# Patient Record
Sex: Female | Born: 1990 | Race: Black or African American | Hispanic: No | Marital: Single | Smoking: Light tobacco smoker
Health system: Southern US, Community
[De-identification: ages and names within clinical notes are randomized; demographics above are authoritative.]

## PROBLEM LIST (undated history)

## (undated) DIAGNOSIS — A6000 Herpesviral infection of urogenital system, unspecified: Secondary | ICD-10-CM

## (undated) DIAGNOSIS — E1129 Type 2 diabetes mellitus with other diabetic kidney complication: Secondary | ICD-10-CM

## (undated) HISTORY — PX: FOOT SURGERY: SHX648

---
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
## 2011-08-29 ENCOUNTER — Ambulatory Visit: Payer: Self-pay | Admitting: Internal Medicine

## 2011-09-18 ENCOUNTER — Ambulatory Visit: Payer: Self-pay | Admitting: Internal Medicine

## 2011-10-23 ENCOUNTER — Ambulatory Visit: Payer: Self-pay | Admitting: Internal Medicine

## 2011-11-18 ENCOUNTER — Ambulatory Visit: Payer: Self-pay | Admitting: Internal Medicine

## 2012-05-05 ENCOUNTER — Emergency Department: Payer: Self-pay | Admitting: Emergency Medicine

## 2012-05-05 LAB — PREGNANCY, URINE: Pregnancy Test, Urine: NEGATIVE m[IU]/mL

## 2012-06-08 ENCOUNTER — Ambulatory Visit: Payer: Self-pay | Admitting: Sports Medicine

## 2012-06-10 ENCOUNTER — Ambulatory Visit: Payer: Self-pay | Admitting: Sports Medicine

## 2012-06-27 ENCOUNTER — Ambulatory Visit: Payer: Self-pay | Admitting: Anesthesiology

## 2012-06-27 LAB — CBC WITH DIFFERENTIAL/PLATELET
Basophil #: 0.1 10*3/uL (ref 0.0–0.1)
Eosinophil #: 0.1 10*3/uL (ref 0.0–0.7)
HCT: 39.1 % (ref 35.0–47.0)
Lymphocyte #: 1.5 10*3/uL (ref 1.0–3.6)
MCH: 27.9 pg (ref 26.0–34.0)
MCHC: 33.1 g/dL (ref 32.0–36.0)
MCV: 84 fL (ref 80–100)
Monocyte #: 0.5 x10 3/mm (ref 0.2–0.9)
Neutrophil #: 6.5 10*3/uL (ref 1.4–6.5)
Platelet: 307 10*3/uL (ref 150–440)
RDW: 13.3 % (ref 11.5–14.5)

## 2012-06-27 LAB — BASIC METABOLIC PANEL
Calcium, Total: 8.7 mg/dL (ref 8.5–10.1)
Chloride: 101 mmol/L (ref 98–107)
Co2: 27 mmol/L (ref 21–32)
EGFR (African American): >60
EGFR (Non-African Amer.): >60
Glucose: 334 mg/dL — ABNORMAL HIGH (ref 65–99)
Osmolality: 283 (ref 275–301)
Sodium: 135 mmol/L — ABNORMAL LOW (ref 136–145)

## 2012-07-08 ENCOUNTER — Ambulatory Visit: Payer: Self-pay | Admitting: Orthopedic Surgery

## 2012-07-08 LAB — PREGNANCY, URINE: Pregnancy Test, Urine: NEGATIVE m[IU]/mL

## 2012-07-11 ENCOUNTER — Ambulatory Visit: Payer: Self-pay | Admitting: Orthopedic Surgery

## 2013-07-24 ENCOUNTER — Observation Stay: Payer: Self-pay | Admitting: Internal Medicine

## 2013-07-24 LAB — CBC
HCT: 43.4 % (ref 35.0–47.0)
HGB: 15.4 g/dL (ref 12.0–16.0)
MCHC: 35.4 g/dL (ref 32.0–36.0)
Platelet: 291 10*3/uL (ref 150–440)

## 2013-07-24 LAB — TROPONIN I: Troponin-I: <0.02 ng/mL

## 2013-07-24 LAB — COMPREHENSIVE METABOLIC PANEL
Albumin: 3.7 g/dL (ref 3.4–5.0)
Alkaline Phosphatase: 142 U/L — ABNORMAL HIGH (ref 50–136)
BUN: 8 mg/dL (ref 7–18)
Bilirubin,Total: 0.5 mg/dL (ref 0.2–1.0)
Calcium, Total: 9.3 mg/dL (ref 8.5–10.1)
Chloride: 104 mmol/L (ref 98–107)
Creatinine: 0.68 mg/dL (ref 0.60–1.30)
EGFR (Non-African Amer.): >60
Osmolality: 279 (ref 275–301)
Potassium: 3.9 mmol/L (ref 3.5–5.1)
SGOT(AST): 14 U/L — ABNORMAL LOW (ref 15–37)
Total Protein: 7.9 g/dL (ref 6.4–8.2)

## 2013-07-24 LAB — URINALYSIS, COMPLETE
Blood: NEGATIVE
Glucose,UR: >=500 mg/dL (ref 0–75)
Nitrite: NEGATIVE
Protein: NEGATIVE
RBC,UR: 7 /HPF (ref 0–5)
WBC UR: 11 /HPF (ref 0–5)

## 2013-07-24 LAB — APTT: Activated PTT: 25.6 secs (ref 23.6–35.9)

## 2013-07-24 LAB — PRO B NATRIURETIC PEPTIDE: B-Type Natriuretic Peptide: 139 pg/mL — ABNORMAL HIGH (ref 0–125)

## 2013-07-24 LAB — PROTIME-INR: Prothrombin Time: 13.1 secs (ref 11.5–14.7)

## 2013-07-25 LAB — LIPID PANEL
Cholesterol: 181 mg/dL (ref 0–200)
HDL Cholesterol: 30 mg/dL — ABNORMAL LOW (ref 40–60)
Ldl Cholesterol, Calc: 106 mg/dL — ABNORMAL HIGH (ref 0–100)
VLDL Cholesterol, Calc: 45 mg/dL — ABNORMAL HIGH (ref 5–40)

## 2013-07-25 LAB — BASIC METABOLIC PANEL
Anion Gap: 7 (ref 7–16)
BUN: 9 mg/dL (ref 7–18)
Calcium, Total: 8.8 mg/dL (ref 8.5–10.1)
Co2: 25 mmol/L (ref 21–32)
Creatinine: 0.73 mg/dL (ref 0.60–1.30)
EGFR (African American): >60
EGFR (Non-African Amer.): >60
Glucose: 401 mg/dL — ABNORMAL HIGH (ref 65–99)
Osmolality: 282 (ref 275–301)
Sodium: 133 mmol/L — ABNORMAL LOW (ref 136–145)

## 2013-07-25 LAB — CBC WITH DIFFERENTIAL/PLATELET
Basophil #: 0.1 10*3/uL (ref 0.0–0.1)
Basophil %: 0.8 %
HCT: 42.1 % (ref 35.0–47.0)
HGB: 14.4 g/dL (ref 12.0–16.0)
Lymphocyte #: 2.3 10*3/uL (ref 1.0–3.6)
Lymphocyte %: 26 %
MCHC: 34.3 g/dL (ref 32.0–36.0)
MCV: 84 fL (ref 80–100)
Monocyte #: 0.5 x10 3/mm (ref 0.2–0.9)
Neutrophil #: 6 10*3/uL (ref 1.4–6.5)
Neutrophil %: 66.3 %
RBC: 5.03 10*6/uL (ref 3.80–5.20)

## 2013-07-25 LAB — HEMOGLOBIN A1C: Hemoglobin A1C: 13.3 % — ABNORMAL HIGH (ref 4.2–6.3)

## 2013-07-25 LAB — MAGNESIUM: Magnesium: 1.6 mg/dL — ABNORMAL LOW

## 2013-07-25 LAB — TROPONIN I: Troponin-I: <0.02 ng/mL

## 2013-07-26 LAB — URINE CULTURE

## 2013-07-26 LAB — SODIUM: Sodium: 134 mmol/L — ABNORMAL LOW (ref 136–145)

## 2013-12-15 ENCOUNTER — Emergency Department: Payer: Self-pay | Admitting: Emergency Medicine

## 2013-12-15 LAB — BASIC METABOLIC PANEL
Anion Gap: 7 (ref 7–16)
Chloride: 102 mmol/L (ref 98–107)
Creatinine: 0.65 mg/dL (ref 0.60–1.30)
EGFR (African American): >60
EGFR (Non-African Amer.): >60
Glucose: 416 mg/dL — ABNORMAL HIGH (ref 65–99)
Osmolality: 281 (ref 275–301)
Potassium: 4.2 mmol/L (ref 3.5–5.1)

## 2013-12-15 LAB — CBC
HCT: 40.8 % (ref 35.0–47.0)
HGB: 13.9 g/dL (ref 12.0–16.0)
MCHC: 34.1 g/dL (ref 32.0–36.0)
RBC: 4.87 10*6/uL (ref 3.80–5.20)
RDW: 13.1 % (ref 11.5–14.5)
WBC: 9.1 10*3/uL (ref 3.6–11.0)

## 2014-03-25 ENCOUNTER — Emergency Department: Payer: Self-pay | Admitting: Emergency Medicine

## 2014-07-13 ENCOUNTER — Emergency Department: Payer: Self-pay | Admitting: Emergency Medicine

## 2014-07-13 LAB — URINALYSIS, COMPLETE
BILIRUBIN, UR: NEGATIVE
Nitrite: NEGATIVE
PROTEIN: NEGATIVE
Ph: 5 (ref 4.5–8.0)
RBC,UR: 103 /HPF (ref 0–5)
Specific Gravity: 1.042 (ref 1.003–1.030)
Squamous Epithelial: 27
WBC UR: 115 /HPF (ref 0–5)

## 2014-07-13 LAB — CBC WITH DIFFERENTIAL/PLATELET
Basophil #: 0.1 10*3/uL (ref 0.0–0.1)
Basophil %: 0.6 %
EOS ABS: 0.1 10*3/uL (ref 0.0–0.7)
EOS PCT: 1.1 %
HCT: 45.3 % (ref 35.0–47.0)
HGB: 14.9 g/dL (ref 12.0–16.0)
LYMPHS ABS: 1.5 10*3/uL (ref 1.0–3.6)
Lymphocyte %: 16.7 %
MCH: 28.1 pg (ref 26.0–34.0)
MCHC: 32.9 g/dL (ref 32.0–36.0)
MCV: 85 fL (ref 80–100)
Monocyte #: 0.5 x10 3/mm (ref 0.2–0.9)
Monocyte %: 5.4 %
NEUTROS ABS: 6.7 10*3/uL — AB (ref 1.4–6.5)
Neutrophil %: 76.2 %
PLATELETS: 302 10*3/uL (ref 150–440)
RBC: 5.31 10*6/uL — ABNORMAL HIGH (ref 3.80–5.20)
RDW: 12.9 % (ref 11.5–14.5)
WBC: 8.8 10*3/uL (ref 3.6–11.0)

## 2014-07-13 LAB — BASIC METABOLIC PANEL
Anion Gap: 9 (ref 7–16)
BUN: 8 mg/dL (ref 7–18)
CALCIUM: 8.8 mg/dL (ref 8.5–10.1)
Chloride: 102 mmol/L (ref 98–107)
Co2: 21 mmol/L (ref 21–32)
Creatinine: 0.67 mg/dL (ref 0.60–1.30)
EGFR (African American): >60
EGFR (Non-African Amer.): >60
GLUCOSE: 375 mg/dL — AB (ref 65–99)
OSMOLALITY: 278 (ref 275–301)
Potassium: 4 mmol/L (ref 3.5–5.1)
SODIUM: 132 mmol/L — AB (ref 136–145)

## 2014-07-13 LAB — TROPONIN I: Troponin-I: <0.02 ng/mL

## 2014-07-15 ENCOUNTER — Emergency Department: Payer: Self-pay | Admitting: Emergency Medicine

## 2014-07-15 LAB — URINALYSIS, COMPLETE
Bilirubin,UR: NEGATIVE
Glucose,UR: >=500 mg/dL (ref 0–75)
Nitrite: NEGATIVE
PH: 5 (ref 4.5–8.0)
PROTEIN: NEGATIVE
RBC,UR: 55 /HPF (ref 0–5)
Specific Gravity: 1.039 (ref 1.003–1.030)
Squamous Epithelial: 12

## 2014-07-15 LAB — COMPREHENSIVE METABOLIC PANEL
AST: 11 U/L — AB (ref 15–37)
Albumin: 3.5 g/dL (ref 3.4–5.0)
Alkaline Phosphatase: 141 U/L — ABNORMAL HIGH
Anion Gap: 9 (ref 7–16)
BUN: 7 mg/dL (ref 7–18)
Bilirubin,Total: 0.5 mg/dL (ref 0.2–1.0)
Calcium, Total: 8 mg/dL — ABNORMAL LOW (ref 8.5–10.1)
Chloride: 101 mmol/L (ref 98–107)
Co2: 22 mmol/L (ref 21–32)
Creatinine: 0.73 mg/dL (ref 0.60–1.30)
EGFR (African American): >60
EGFR (Non-African Amer.): >60
Glucose: 420 mg/dL — ABNORMAL HIGH (ref 65–99)
OSMOLALITY: 280 (ref 275–301)
POTASSIUM: 4 mmol/L (ref 3.5–5.1)
SGPT (ALT): 21 U/L
Sodium: 132 mmol/L — ABNORMAL LOW (ref 136–145)
TOTAL PROTEIN: 7.6 g/dL (ref 6.4–8.2)

## 2014-07-15 LAB — CBC
HCT: 42.8 % (ref 35.0–47.0)
HGB: 14.1 g/dL (ref 12.0–16.0)
MCH: 28.2 pg (ref 26.0–34.0)
MCHC: 32.8 g/dL (ref 32.0–36.0)
MCV: 86 fL (ref 80–100)
Platelet: 286 10*3/uL (ref 150–440)
RBC: 4.99 10*6/uL (ref 3.80–5.20)
RDW: 13.2 % (ref 11.5–14.5)
WBC: 6.8 10*3/uL (ref 3.6–11.0)

## 2014-07-15 LAB — LIPASE, BLOOD: Lipase: 89 U/L (ref 73–393)

## 2014-09-12 ENCOUNTER — Emergency Department: Payer: Self-pay | Admitting: Student

## 2014-09-14 ENCOUNTER — Emergency Department: Payer: Self-pay | Admitting: Emergency Medicine

## 2014-11-08 ENCOUNTER — Emergency Department: Payer: Self-pay | Admitting: Student

## 2014-11-13 ENCOUNTER — Observation Stay: Payer: Self-pay | Admitting: Surgery

## 2014-11-13 LAB — CBC WITH DIFFERENTIAL/PLATELET
BASOS PCT: 0.4 %
Basophil #: 0 10*3/uL (ref 0.0–0.1)
Eosinophil #: 0.1 10*3/uL (ref 0.0–0.7)
Eosinophil %: 0.8 %
HCT: 35.9 % (ref 35.0–47.0)
HGB: 11.9 g/dL — AB (ref 12.0–16.0)
LYMPHS PCT: 13.1 %
Lymphocyte #: 1.1 10*3/uL (ref 1.0–3.6)
MCH: 28.2 pg (ref 26.0–34.0)
MCHC: 33.2 g/dL (ref 32.0–36.0)
MCV: 85 fL (ref 80–100)
Monocyte #: 0.7 x10 3/mm (ref 0.2–0.9)
Monocyte %: 7.8 %
Neutrophil #: 6.7 10*3/uL — ABNORMAL HIGH (ref 1.4–6.5)
Neutrophil %: 77.9 %
PLATELETS: 319 10*3/uL (ref 150–440)
RBC: 4.23 10*6/uL (ref 3.80–5.20)
RDW: 12.5 % (ref 11.5–14.5)
WBC: 8.7 10*3/uL (ref 3.6–11.0)

## 2014-11-13 LAB — BASIC METABOLIC PANEL
Anion Gap: 5 — ABNORMAL LOW (ref 7–16)
BUN: 5 mg/dL — AB (ref 7–18)
Calcium, Total: 8.8 mg/dL (ref 8.5–10.1)
Chloride: 99 mmol/L (ref 98–107)
Co2: 28 mmol/L (ref 21–32)
Creatinine: 0.8 mg/dL (ref 0.60–1.30)
EGFR (African American): >60
EGFR (Non-African Amer.): >60
GLUCOSE: 495 mg/dL — AB (ref 65–99)
OSMOLALITY: 284 (ref 275–301)
POTASSIUM: 4 mmol/L (ref 3.5–5.1)
Sodium: 132 mmol/L — ABNORMAL LOW (ref 136–145)

## 2014-11-14 LAB — PREGNANCY, URINE: Pregnancy Test, Urine: NEGATIVE m[IU]/mL

## 2014-11-16 LAB — WOUND AEROBIC CULTURE

## 2014-11-18 LAB — CULTURE, BLOOD (SINGLE)

## 2015-01-13 ENCOUNTER — Emergency Department: Payer: Self-pay | Admitting: Emergency Medicine

## 2015-01-22 ENCOUNTER — Inpatient Hospital Stay: Payer: Self-pay | Admitting: Surgery

## 2015-01-22 LAB — BASIC METABOLIC PANEL
ANION GAP: 8 (ref 7–16)
BUN: 5 mg/dL — ABNORMAL LOW (ref 7–18)
CREATININE: 0.79 mg/dL (ref 0.60–1.30)
Calcium, Total: 9.1 mg/dL (ref 8.5–10.1)
Chloride: 99 mmol/L (ref 98–107)
Co2: 25 mmol/L (ref 21–32)
GLUCOSE: 403 mg/dL — AB (ref 65–99)
Osmolality: 279 (ref 275–301)
Potassium: 4 mmol/L (ref 3.5–5.1)
Sodium: 132 mmol/L — ABNORMAL LOW (ref 136–145)

## 2015-01-22 LAB — CBC WITH DIFFERENTIAL/PLATELET
BASOS ABS: 0.1 10*3/uL (ref 0.0–0.1)
Basophil %: 0.6 %
EOS PCT: 0.6 %
Eosinophil #: 0.1 10*3/uL (ref 0.0–0.7)
HCT: 39.5 % (ref 35.0–47.0)
HGB: 13.2 g/dL (ref 12.0–16.0)
LYMPHS ABS: 1.5 10*3/uL (ref 1.0–3.6)
LYMPHS PCT: 12.7 %
MCH: 27.8 pg (ref 26.0–34.0)
MCHC: 33.4 g/dL (ref 32.0–36.0)
MCV: 83 fL (ref 80–100)
MONOS PCT: 4.8 %
Monocyte #: 0.6 x10 3/mm (ref 0.2–0.9)
NEUTROS ABS: 9.5 10*3/uL — AB (ref 1.4–6.5)
Neutrophil %: 81.3 %
Platelet: 357 10*3/uL (ref 150–440)
RBC: 4.75 10*6/uL (ref 3.80–5.20)
RDW: 13.3 % (ref 11.5–14.5)
WBC: 11.7 10*3/uL — AB (ref 3.6–11.0)

## 2015-01-23 LAB — HEMOGLOBIN A1C: Hemoglobin A1C: 13.3 % — ABNORMAL HIGH (ref 4.2–6.3)

## 2015-01-24 LAB — LIPID PANEL
Cholesterol: 141 mg/dL (ref 0–200)
HDL Cholesterol: 30 mg/dL — ABNORMAL LOW (ref 40–60)
Ldl Cholesterol, Calc: 83 mg/dL (ref 0–100)
Triglycerides: 142 mg/dL (ref 0–200)
VLDL Cholesterol, Calc: 28 mg/dL (ref 5–40)

## 2015-01-24 LAB — SODIUM: SODIUM: 136 mmol/L (ref 136–145)

## 2015-01-24 LAB — TSH: Thyroid Stimulating Horm: 0.538 u[IU]/mL

## 2015-01-24 LAB — VANCOMYCIN, TROUGH: Vancomycin, Trough: 11 ug/mL (ref 10–20)

## 2015-01-24 LAB — WBC: WBC: 9.7 10*3/uL (ref 3.6–11.0)

## 2015-01-24 LAB — CULTURE, BLOOD (SINGLE)

## 2015-01-25 LAB — CREATININE, SERUM
Creatinine: 0.72 mg/dL (ref 0.60–1.30)
EGFR (Non-African Amer.): >60

## 2015-01-25 LAB — CULTURE, BLOOD (SINGLE)

## 2015-01-25 LAB — RAPID HIV SCREEN (HIV 1/2 AB+AG)

## 2015-04-06 NOTE — Op Note (Signed)
PATIENT NAME:  Katie Gilbert, [name] MR#:  [id number] DATE OF BIRTH:  1991/06/27  DATE OF PROCEDURE:  07/11/2012  PREOPERATIVE DIAGNOSIS: Bilateral hip hematomas.   POSTOPERATIVE DIAGNOSIS: Bilateral hip hematomas.   PROCEDURE: Irrigation and debridement of bilateral hip hematomas.   ANESTHESIA: General.   SURGEON: Leitha SchullerMichael J. Shericka Johnstone, MD    DESCRIPTION OF PROCEDURE: The patient was placed in a supine position with a bump underneath the left hip. The left hip was prepped and draped in the usual sterile fashion. After patient identification and time-out procedures were completed, anterior lateral incision was made over the proximal thigh approximately 6 cm in length with meticulous hemostasis achieved in the subcutaneous tissue. The large hematoma was entered and fluid evacuated along with some necrotic fatty tissue. The wound was then irrigated. A rongeur was used to remove the smooth wall, this was essentially a pseudocyst, with a rasp also used to roughen the tissue. After rongeuring away the area that had been opened, additional debridement over the fascia was carried out to provide a subcutaneous fat layer of scar back to the cyst. The hematoma area was approximately 14 cm long, 7 cm wide, and 4 cm deep. Foam was then placed into the wound and Wound VAC applied. The bump was then taken out from under the left hip and the right hip prepped and draped in the usual sterile fashion as previously described. The anterior lateral incision was made directly over the mass and the mass entered. There was a large amount of hematoma present. The total estimated blood loss was approximately 350 mL, most of this being from the hematomas. Rongeur was then used again to debride so the pseudocyst wall and the deep fascia to get fresh tissue to get scarring. There was also some necrotic subcutaneous fat which was debrided as well. The right hip hematoma measured 15 cm long, 12 cm wide, and 4 cm deep. Wound VAC was applied to  this wound as well and after both suctions were applied the was no prominence from the hematoma which was present at the start of the case. The patient was sent to the recovery room in stable condition.   ESTIMATED BLOOD LOSS: 350.   COMPLICATIONS: None.   SPECIMENS: None.   Wound VAC was applied to both hips.    ____________________________ Leitha SchullerMichael J. Birt Reinoso, MD mjm:drc D: 07/11/2012 21:23:11 ET T: 07/12/2012 10:22:59 ET JOB#: [id number]  cc: Leitha SchullerMichael J. Romi Rathel, MD, <Dictator> Leitha SchullerMICHAEL J Shallen Luedke MD ELECTRONICALLY SIGNED 07/12/2012 17:03

## 2015-04-09 NOTE — H&P (Signed)
PATIENT NAME:  Katie Gilbert, Katie Gilbert MR#:  [id number] DATE OF BIRTH:  Mar 08, 1991  DATE OF ADMISSION:  07/24/2013  PRIMARY CARE PHYSICIAN: None.   REFERRING EMERGENCY ROOM PHYSICIAN: Cecille AmsterdamJonathan E. Mayford KnifeWilliams, MD  CHIEF COMPLAINT: Dizziness.   HISTORY OF PRESENT ILLNESS: The patient is a 24 year old female with past history of diabetes since age of 14 years. She was in her usual state of health until yesterday. She woke up today at 1:00 p.m. She works in [employer], so she went to her work in the afternoon and she was feeling dizzy. Since she woke up this afternoon, she is feeling dizzy and she is feeling any palpitations and lightheaded. At work, she continued feeling like this,  so she just sat down on a bench and after a few minutes tried to get up but every time she tried to get up, she was feeling dizzy and so finally decided to come to the Emergency Room. On arrival to ER, she was found having atrial fibrillation and her heart rate was at  one time ranging like 115, 120. Blood pressure was high when she came in the Emergency Room, but then her blood pressure remained under control and her heart rate came down by 12. She is still running atrial fibrillation, but at a rate of 90 to 100. She received 1 dose of Lovenox 150 mg subcutaneous stat by ER and given for admission for further management, monitoring and to find out the reason for the atrial fibrillation to hospitalist team.   REVIEW OF SYSTEMS:    CONSTITUTIONAL: Negative for fever, fatigue, weakness, pain or weight loss.  EYES: No blurring, double vision, pain or redness.  EARS, NOSE, THROAT: No tinnitus, ear pain, hearing loss or discharge.  RESPIRATORY: No cough, wheezing, hemoptysis or shortness of breath.  CARDIOVASCULAR: No chest pain, but she had arrhythmia, palpitations and feeling dizzy. Denies any syncopal episodes or edema on the legs.  GASTROINTESTINAL: No nausea, vomiting, diarrhea or abdominal pain.  GENITOURINARY: No dysuria,  hematuria or increased frequency in the urination.  ENDOCRINE: No nocturia, heat or cold intolerance or weight loss.  MUSCULOSKELETAL: No pain or swelling in the joints.  SKIN: No acne or rashes.  NEUROLOGICAL: No numbness, weakness, tremors or vertigo.   PAST MEDICAL HISTORY: Diabetes since age of 14 years.   ALLERGIES TO MEDICATION: None.   HOME MEDICATIONS: Metformin and Lantus as per sliding scale.   SOCIAL HISTORY: She used to smoke infrequently and sometimes marijuana, but she stopped since last 1 month. Before that, she was once a week smoker. She denies any other drug use. For alcohol, she is using once or twice a month (that would be socially). She denies any excessive coffee or chocolate use. Last alcohol consumption was last week. Denies any excessive stress or sleeplessness due to work. Works at [employer].   FAMILY HISTORY: Father died of MI at age of 24 years.   PHYSICAL EXAMINATION: VITAL SIGNS: On presentation in the ER, heart rate was 84. Blood pressure was 176/120 and temperature was 98.9. Heart went up to 114 one time and as per EKG which was done in the ER, heart rate is 125. Oxygen saturation 99 on room air.  GENERAL APPEARANCE: Obese, well-developed, not in acute distress.  HEENT: Head and neck atraumatic. Conjunctivae pink. Oral mucosa moist.  NECK: Supple. No JVD.  RESPIRATORY: Bilateral clear and equal air entry.  CARDIOVASCULAR: S1, S2 present, irregular, tachycardia. No murmur appreciated.  ABDOMEN: Soft, nontender. Bowel sounds present.  No organomegaly.  SKIN: No rashes.  MUSCULOSKELETAL: Legs: No edema. Joints: No tenderness or swelling.  NEUROLOGICAL: Power 5/5. Follows commands. No tremors.   LABORATORY DATA: Glucose 331. BNP 139. BUN 8, creatinine 0.68, sodium 134, potassium 3.9, chloride 104, CO2 of 22, total protein 7.9, albumin 3.7, bilirubin 0.5, SGOT 14, SGPT 20. TSH 1.03. Troponin less than 0.02. WBC 9.5, hemoglobin 15.4, platelet count 291.  D-dimer less than 0.22. Urinalysis is positive with 11 WBCs  and 3+ leukocyte esterase with trace bacteria, but the patient does not have any complaints.   ASSESSMENT AND PLAN: A 24 year old female with past medical history of diabetes, morbidly obese, presented with feeling dizziness and found having new-onset atrial fibrillation. Urinalysis is positive for infection.  1.  New-onset atrial fibrillation: Possibly this might be due to her urinary tract infection, which urinalysis is positive, recently reported now. Will treat her urinary tract infection but for atrial fibrillation, will try to find out the cause. Will check magnesium level. Follow her on telemetry and troponin levels. Will do echocardiogram and do cardiology consult for further management.  2.  Urinary tract infection: Will trend urine culture and will start him on intravenous Rocephin for now.  3.  Diabetes: Will continue metformin as she is taking and put her on sliding scale insulin coverage.  TOTAL TIME SPENT: 45 minutes.   CODE STATUS: Full code.    ____________________________ Hope Pigeon Elisabeth Pigeon, MD vgv:jm D: 07/24/2013 20:42:02 ET T: 07/24/2013 20:56:48 ET JOB#: [id number]  cc: Hope Pigeon. Elisabeth Pigeon, MD, <Dictator> Altamese Dilling MD ELECTRONICALLY SIGNED 08/05/2013 11:15

## 2015-04-09 NOTE — Consult Note (Signed)
PATIENT NAME:  Katie Gilbert, Katie Gilbert MR#:  [id number] DATE OF BIRTH:  August 28, 1991  DATE OF CONSULTATION:  07/25/2013  REFERRING PHYSICIAN:  Dr. Allena KatzPatel. CONSULTING PHYSICIAN:  Lamar BlinksBruce J. Jovan Schickling, MD  REASON FOR CONSULTATION: Diabetes with atrial fibrillation with rapid ventricular rate.   CHIEF COMPLAINT: "Gilbert am weak."   HISTORY OF PRESENT ILLNESS: This is a 24 year old female with known diabetes on appropriate medication management and stable. The patient has not had any hypertension requiring further intervention. Recently, she has had 3 to 4 days of weakness and fatigue, and then was going to work with some irregular heartbeat, weakness and fatigue and dizziness. When taken to the Emergency Room, she had reasonable blood pressure control, but had an EKG showing atrial fibrillation with rapid ventricular rate. The patient has had no evidence of chest discomfort or congestive heart failure. Currently, the patient has had additional medication management with better heart rate control. Currently, she is feeling better.   REVIEW OF SYSTEMS: Remainder of review of systems negative for syncope, dizziness, nausea, diaphoresis, weakness, fatigue, vision change, ringing in the ears, hearing loss, cough, congestion, heartburn, nausea, vomiting, diarrhea, bloody stools, stomach pain, extremity pain, leg weakness, cramping of the buttocks, known blood clots, headaches, blackouts, skin lesions or skin rashes.   PAST MEDICAL HISTORY:  1. Diabetes.   FAMILY HISTORY: Father died of myocardial infarction at age 24.   SOCIAL HISTORY: She denies tobacco use. Occasionally drinks alcohol.   ALLERGIES AND MEDICATIONS: As listed.   PHYSICAL EXAMINATION:  VITAL SIGNS: Blood pressure is 122/68 bilaterally, heart rate 78 upright, reclining, and irregular.  GENERAL: She is a well-appearing female in no acute distress.  HEAD, EYES, EARS, NOSE AND THROAT: No icterus, thyromegaly, ulcers, hemorrhage or xanthelasma.   CARDIOVASCULAR: Irregularly irregular with normal S1 and S2, without any murmur, gallop or rub. PMI is diffuse. Carotid upstroke normal without bruit. Jugular venous pressure is normal.  LUNGS: Have a few basilar crackles, with normal respirations.  ABDOMEN: Soft, nontender, without hepatosplenomegaly or masses. Abdominal aorta is normal size without bruit.  EXTREMITIES: Show 2+ radial, femoral, dorsal pedal pulses with no lower extremity edema, cyanosis, clubbing or ulcers.  NEUROLOGIC: She is oriented to time, place and person, with normal mood and affect.   ASSESSMENT: A 24 year old female with diabetes and new-onset atrial fibrillation with rapid ventricular rate.   RECOMMENDATIONS:  1. Further investigation of other causes, including thyroid and/or electrolyte abnormalities for cause of atrial fibrillation.  2. Calcium channel blocker for heart rate control with goal heart rate between 60 and 90 beats per minute, with possible spontaneous conversion to normal rhythm.  3. Anticoagulation while actively treating atrial fibrillation for further risk reduction in stroke with Xarelto 20 mg each day.  4. Echocardiogram for LV systolic dysfunction and valvular heart disease causing above.  5. Further adjustments of medication management, including Cardizem, with ambulation and discharged home thereafter.   ____________________________ Lamar BlinksBruce J. Cai Flott, MD bjk:OSi D: 07/25/2013 08:03:12 ET T: 07/25/2013 08:25:53 ET JOB#: [id number]  cc: Lamar BlinksBruce J. Alyric Parkin, MD, <Dictator> Lamar BlinksBRUCE J Addeline Calarco MD ELECTRONICALLY SIGNED 07/25/2013 14:08

## 2015-04-09 NOTE — Discharge Summary (Signed)
PATIENT NAME:  Katie Gilbert, Katie Gilbert MR#:  [id number] DATE OF BIRTH:  [birth year]  DATE OF ADMISSION:  07/24/2013 DATE OF DISCHARGE:  07/26/2013  PRESENTING COMPLAINT: Palpitations.  DISCHARGE DIAGNOSIS:  1.  Atrial fibrillation with rapid ventricular response, new onset.  2.  Poorly controlled type 2 diabetes.  3.  Morbid obesity.  4.  Suspected sleep apnea.   CONDITION ON DISCHARGE: Fair.   DISCHARGE MEDICATIONS: 1.  Metformin 1000 mg b.Gilbert.d.  2.  Lantus 30 units daily.  3.  Aspirin 325 mg p.o. daily.  4.  Diltiazem 120 mg extended release daily.   DISCHARGE INSTRUCTIONS: 1.  Follow up with Dr. Gwen PoundsKowalski in 1 to 2 weeks.  2.  Follow up with Dr. Ellie LunchBhakti Paul, endocrinologist, at [facility].  3.  Re-establish primary care with Dr. Yates DecampJohn Walker.  CONSULTANTS:  Cardiology with Dr. Gwen PoundsKowalski.   DISCHARGE LABORATORY AND DIAGNOSTICS:  Sodium is 134. Troponin is less than  0.02. TSH is 1.42. Hemoglobin A1c is 13. Magnesium is 1.6.  H and H are 14.4 and 42.1.  White count is 9.0. Cholesterol is 181. HDL is 30. LDL is 106.   Chest x-ray:  No acute cardiopulmonary disease.   UA negative for UTI. Negative for proteinuria.   PT-INR within normal limits. B-type natriuretic peptide is 139.  BRIEF SUMMARY OF HOSPITAL COURSE:  Katie Gilbert is a 24 year old obese 325 pound African American female with history of type 2 diabetes since age 24 comes to the Emergency Room after she started noticing having some palpitations and dizziness at work. She was admitted with:  1.  New onset A-fib with RVR. The patient was started on p.o. Cardizem.  She was converted to sinus rhythm prior to discharge. Cardiology consultation was obtained with Dr. Gwen PoundsKowalski. She was initially started on Lovenox subcu b.Gilbert.d. which was changed to p.o. aspirin 325 per cardiology recommendations. Underlying etiology for A-fib could be untreated sleep apnea. She will get further work-up as outpatient by Dr. Gwen PoundsKowalski.  2.  UTI. The  patient was given Cipro for 3 days.  3.  Type 2 diabetes, poorly controlled. The patient's metformin was continued. She was put on her home dose Lantus. She was also asked to follow up with Dr. Renae FicklePaul, endocrinology, as an outpatient.  4.  Hyperlipidemia. Dietary restrictions explained. Will need tight control and at a later date can be started on statins if fails diet control.   Hospital stay otherwise remained stable. The patient remained a FULL CODE.   TIME SPENT: 40 minutes. ____________________________ Wylie HailSona A. Allena KatzPatel, MD sap:sb D: 07/28/2013 06:34:48 ET T: 07/28/2013 11:42:13 ET JOB#: [id number]  cc: Staley Lunz A. Allena KatzPatel, MD, <Dictator> Lamar BlinksBruce J. Kowalski, MD Bhakti B. Renae FicklePaul, MD Letta PateJohn B. Danne HarborWalker III, MD Willow OraSONA A Victory Dresden MD ELECTRONICALLY SIGNED 08/08/2013 5:40
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
## 2015-04-10 NOTE — Op Note (Signed)
PATIENT NAME:  Katie Gilbert, Katie Gilbert MR#:  [id number] DATE OF BIRTH:  1990-12-27  DATE OF PROCEDURE:  11/14/2014  PREOPERATIVE DIAGNOSIS: Large persistent inadequately drained back abscess.   POSTOPERATIVE DIAGNOSIS: Large persistent inadequately drained back abscess.   PROCEDURE PERFORMED:  Incision and drainage of large back abscess.   ANESTHESIA:  IV sedation and local anesthetic.   SPECIMEN: None.   ESTIMATED BLOOD LOSS: 10 mL.   COMPLICATIONS: None.   INDICATION FOR SURGERY: Katie Gilbert is a pleasant 24 year old who presented with a large back abscess that was inadequately drained in the ED. There was still a copious amount of purulence. She was brought to the Operating Room for incision and drainage of a large back abscess.   DETAILS OF PROCEDURE: Informed consent was obtained. Katie Gilbert was laid in the right lateral decubitus position and given IV sedation. Her back was then prepped and draped in standard surgical fashion. Timeout then was performed correctly identifying the patient name, operative site and procedure to be performed. The wound was probed with a hemostat. Loculations were broken up. A counterincision was made at the inferior and superior aspects. Two Penroses which were 1/4 inch were then sutured to the skin edges at the incisions. The wound was then irrigated and packed with iodine-soaked gauze. A sterile dressing was then placed over the wound. The patient was then awoken, extubated, and brought to the postanesthesia care unit. There were no immediate complications. Needle, sponge, and instrument counts correct at the end of the procedure.    ____________________________ Si Raiderhristopher A. Abir Eroh, MD cal:at D: 11/14/2014 11:00:04 ET T: 11/14/2014 12:06:59 ET JOB#: [id number]  cc: Cristal Deerhristopher A. Stephens Shreve, MD, <Dictator> Jarvis NewcomerHRISTOPHER A Bonnie Overdorf MD ELECTRONICALLY SIGNED 11/23/2014 20:09

## 2015-04-18 NOTE — Consult Note (Signed)
PATIENT NAME:  Katie Gilbert, Katie Gilbert MR#:  [id number] DATE OF BIRTH:  1991/03/05  DATE OF CONSULTATION:  01/25/2015  REFERRING PHYSICIAN:   CONSULTING PHYSICIAN:  Stann Mainlandavid P. Sampson GoonFitzgerald, MD  REQUESTING PHYSICIAN:  Dr. Sherryll BurgerShah.   REASON FOR CONSULTATION: Staph aureus bacteremia and abscess.   HISTORY OF PRESENT ILLNESS: This is a very pleasant 24 year old female with history of very poorly controlled diabetes, A1c 13.3, who was admitted on February 5 with an abscess on her back, as well one on her breast. She been followed as an outpatient, treated with clindamycin and Bactrim, and also had an outpatient Gilbert and D. The patient was admitted and found to have severe back infection and breast infection. She underwent Gilbert and D in the OR February 6. Cultures from her blood are now growing methicillin sensitive Staph aureus as well as coagulase-negative Staph. Prior cultures of her back in November when she had a similar infection grew methicillin sensitive Staph aureus. Followup blood cultures done February 6 are negative. We are consulted for further antibiotic management.   Clinically the patient reports feeling much better. She still has some pain at the breast and back sites that are dressed postoperatively. There continues to be some drainage.   PAST MEDICAL HISTORY:  1. Diabetes, very poorly controlled.  2. Hypertension.  3. Obesity.  4. Recurrent skin abscesses.   SOCIAL HISTORY: No tobacco, alcohol, or drugs. She lives with her mother. She works at a call center. She does not have any children.   FAMILY HISTORY: Noncontributory.   PAST SURGICAL HISTORY: She underwent an Gilbert and D drainage of her back in November.  ALLERGIES: No known drug allergies.   MEDICATIONS AS AN OUTPATIENT: Metformin.   CURRENT ANTIBIOTICS: Include vancomycin.   REVIEW OF SYSTEMS: 11 systems reviewed and negative except as per HPI.    PHYSICAL EXAMINATION:  VITAL SIGNS: Temperature 98.6, pulse 79, blood pressure 113/77,  respirations 18, saturation 99%.  GENERAL: She is obese, pleasant, interactive, lying in bed, in no acute distress.  HEENT: Pupils equal, round, reactive to light and accommodation. Extraocular movements are intact. Sclerae are anicteric. Oropharynx is clear with no thrush or lesions.  NECK: Supple.  HEART: Regular.  LUNGS: Clear to auscultation bilaterally.  ABDOMEN: Soft, nontender, nondistended. No hepatosplenomegaly.  EXTREMITIES: No clubbing, cyanosis, or edema.  SKIN: On her back she has a large golf ball size area of induration. There is an incision in the midline. It is packed and does have some drainage. On her left breast she also has a similar abscess cavity.  NEUROLOGIC: She is alert and oriented x 3. Grossly nonfocal neurologic exam.   LABORATORY DATA: Blood cultures February 5 with MSSA as well as coag-negative Staph in 1 of 2 cultures. Followup blood cultures February 6 are negative x 2. Culture in November also had MSSA from a back abscess. White blood count on admission February 5 was 11.7, currently is 9.7, hemoglobin 13.2, platelets 357,000. A1c is 13.3. Renal function is normal. Creatinine 0.72.   IMAGING: Chest x-ray done after PICC line placement shows PICC in appropriate position, no acute cardiopulmonary disease.   IMPRESSION: A 24 year old with very poorly controlled diabetes, A1c of 13 on just metformin, admitted with recurrent skin abscesses with methicillin-sensitive Staphylococcus aureus. She underwent incision and drainage on February 6 and the wounds were packed. She has 1 of 2 blood cultures with methicillin-sensitive Staphylococcus aureus as well as coag-negative Staph. Clinically she is improving. She has a PICC line in place.  Followup blood cultures are negative.  Echocardiogram transthoracic is negative.   RECOMMENDATIONS:  1.  Given that she has a skin source, 1 of 2 blood cultures positive, negative transthoracic echocardiogram, and rapid improvement Gilbert would  suggest a 2 week course of IV antibiotics. This can be consolidated to Ancef 1 gram q. 8 hours. She can receive this through the PICC line with home antibiotics from a home health agency. Gilbert have placed the antibiotic order sheet in her chart. She will need weekly CBC and comprehensive panel.  2.  Check an HIV test.  3.  Gilbert can see in 2-3 weeks time to follow up on the abscess as well as to insure clearance of her bacteremia.  4.  She will need much better diabetic control. Note she is at risk of further infection with her very poorly controlled diabetes. Suggest she get established with an endocrinologist as an outpatient.  5.  Thank you for the consult. Gilbert will be glad to follow with you.      ____________________________ Stann Mainland. Sampson Goon, MD dpf:bu D: 01/25/2015 15:29:28 ET T: 01/25/2015 16:01:45 ET JOB#: [id number]  cc: Stann Mainland. Sampson Goon, MD, <Dictator> Jaycelyn Orrison Sampson Goon MD ELECTRONICALLY SIGNED 01/27/2015 20:53

## 2015-04-18 NOTE — Op Note (Signed)
PATIENT NAME:  Katie Gilbert, [name] MR#:  [id number] DATE OF BIRTH:  Aug 09, 1991  DATE OF PROCEDURE:  01/23/2015  OPERATIONS PERFORMED:  1. Incision and drainage of left inner breast abscess.  2. Incision and drainage of back abscess.   SURGEON: Claude MangesWilliam F Wisam Siefring, MD   ANESTHESIA: General.   PREOPERATIVE DIAGNOSIS: Left inner breast abscess, back abscess.   PROCEDURE IN DETAIL: The patient was placed in the right lateral decubitus position and prepped and draped in the usual sterile fashion. Both abscesses were drained identically with incisions made transversely in the lines of the skin and carried down into the abscess cavity, where all of the pus was suctioned clear. Hemostasis was achieved with electrocautery, and each abscess cavity was separately packed with sterile 2-inch Kling gauze, and sterile 4 x 4's were placed on top of this with Elastoplast-type dressings. The patient tolerated the procedure well. There were no complications.    ____________________________ Claude MangesWilliam F. Baptiste Littler, MD wfm:mw D: 01/23/2015 09:44:48 ET T: 01/23/2015 12:09:41 ET JOB#: [id number]  cc: Claude MangesWilliam F. Jamaira Sherk, MD, <Dictator> Claude MangesWILLIAM F Telford Archambeau MD ELECTRONICALLY SIGNED 01/29/2015 19:25
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
## 2015-04-18 NOTE — Discharge Summary (Signed)
PATIENT NAME:  Katie Gilbert, Aslin I MR#:  [id number] DATE OF BIRTH:  1991-10-26  DATE OF ADMISSION:  01/22/2015 DATE OF DISCHARGE:  01/26/2015  PRINCIPAL DIAGNOSIS: Back abscess and medial left breast abscess.   OTHER DIAGNOSES:  1.  Obesity.  2.  Type 2 diabetes mellitus.  PRINCIPAL PROCEDURE PERFORMED DURING THIS ADMISSION: Incision and drainage of left inner breast abscess, incision and drainage of back abscess 01/23/2015.   HOSPITAL COURSE: The patient was admitted to the hospital and medicine consultation was obtained, and patient underwent the above-mentioned procedure for the above-mentioned diagnosis after her blood sugars came down under 400. She was found to have methicillin-sensitive Staphylococcus aureus and positive blood cultures and was maintained on IV antibiotics until discharge. She had a PICC line placed and was placed on IV Ancef, and her dressing was changed and repacked, and she was discharged home and asked to make an appointment to follow up with us in a week.   ____________________________ Claude MangesWilliam F. Michelena Culmer, MD wfm:ST D: 01/29/2015 19:21:39 ET T: 01/30/2015 02:07:55 ET JOB#: [id number]  cc: Claude MangesWilliam F. Shareta Fishbaugh, MD, <Dictator> Claude MangesWILLIAM F Marisel Tostenson MD ELECTRONICALLY SIGNED 01/30/2015 23:05

## 2015-04-18 NOTE — Consult Note (Signed)
PATIENT NAME:  Katie Gilbert, Katie Gilbert MR#:  [id number] DATE OF BIRTH:  1991-05-10  DATE OF CONSULTATION:  01/22/2015  REFERRING PHYSICIAN:   CONSULTING PHYSICIAN:  Shaune PollackQing Iva Posten, MD  PRIMARY CARE PHYSICIAN: John B. Danne HarborWalker III, MD  EMERGENCY ROOM PHYSICIAN: Si RaiderChristopher A. Lundquist, MD   REASON FOR CONSULTATION: High blood sugar.   HISTORY OF PRESENT ILLNESS: The patient is 24 year old female with a history of diabetes, was admitted for back and breast abscess. According to Dr. Juliann PulseLundquist, the patient has a back abscess and a breast abscess and needs Gilbert and D. The patient was seen by Dr. Juliann PulseLundquist 1 week ago, was started with clindamycin and Bactrim. He got an Gilbert and D in the OR with a poor response. Still had the pain, redness, and drainage, so Dr. Juliann PulseLundquist admitted the patient and planned for further procedure. Since the patient's blood sugar is not controlled, Dr. Juliann PulseLundquist suggested a hospitalist consult. The patient denies any fever, chills. No headache or dizziness. Denies any other symptoms.   PAST MEDICAL HISTORY: Diabetes and abscess.   SOCIAL HISTORY: No smoking, alcohol drinking, or illicit drugs.   FAMILY HISTORY: Diabetes. Father died of MI at 2549.   PAST SURGICAL HISTORY: Incision and drainage of back abscess last November.   ALLERGIES: None.  HOME MEDICATIONS: Metformin 500 mg p.o. b.Gilbert.d.   REVIEW OF SYSTEMS:  CONSTITUTIONAL: The patient denies any fever or chills. No headache or dizziness. No weakness.  EYES: No double vision or blurred vision   EARS, NOSE, AND THROAT: No postnasal drip, slurred speech, or dysphagia.  CARDIOVASCULAR: No chest pain, palpitation, orthopnea, nocturnal dyspnea. No leg edema.  PULMONARY: No cough, sputum, shortness of breath, or hemoptysis.  GASTROINTESTINAL: No abdominal pain, nausea, vomiting, diarrhea. No melena. No bloody stool.  GENITOURINARY: No dysuria, hematuria, or incontinence.   SKIN: No rash or jaundice.  NEUROLOGIC: No syncope, loss of  consciousness, or seizure.  HEMATOLOGY: No easy bruising or bleeding.  ENDOCRINOLOGY: No polyuria, polydipsia, heat or cold intolerance.   PHYSICAL EXAMINATION: VITAL SIGNS: Temperature 98.8, blood pressure 134/91, pulse 73, oxygen saturation 99% on room air.  GENERAL: The patient is alert, awake, oriented, in no acute distress.  HEENT: Pupils round, equal and reactive to light and accommodation. Moist oral mucosa. Clear oropharynx.  NECK: Supple. No JVD or carotid bruit. No lymphadenopathy. No thyromegaly.   CARDIOVASCULAR: S1, S2 regular rate and rhythm. No murmurs, gallop.  PULMONARY: Bilateral air entry. No wheezing or rales. No use of accessory muscles to breathe.  ABDOMEN: Soft. No distention or tenderness. No organomegaly. Bowel sounds present.  EXTREMITIES: No edema, clubbing, or cyanosis. No calf tenderness, bilateral pedal pulses present.  SKIN: No rash or jaundice, but has a back abscess and an abscess of her breast on the left side, between the 2 breasts.   LABORATORY DATA: Glucose 471 and later 403, creatinine 0.79. Sodium 132, potassium 4.0, chloride 99, bicarbonate 25, WBC 11.7, hemoglobin 13.2, platelets 357.   IMPRESSIONS: 1. Hyperglycemia.  2. Uncontrolled diabetes. 3. Obesity.  4. Abscess.  5. Hyponatremia due to hyperglycemia.   RECOMMENDATIONS: 1. For hyperglycemia and uncontrolled diabetes, the patient was on Lantus 30 units with metformin 1000 mg p.o. b.Gilbert.d. 2 years ago. The patient stopped Lantus herself. Her hemoglobin A1c was 13.3 two years ago. Gilbert will check hemoglobin A1c , start Levemir 18 units subcutaneous at bedtime with NovoLog 5 units t.Gilbert.d. a.c. We need to adjust the dose depending on the patient's blood sugar. In addition, Gilbert will  start a sliding scale.  2. For abscess, follow-up Dr. Juliann Pulse for Gilbert and D after blood sugar is controlled.  3. Gilbert discussed the patient's condition and the recommendations with the patient.   TIME SPENT: About 53 minutes.     ____________________________ Shaune Pollack, MD qc:mw D: 01/22/2015 18:24:56 ET T: 01/22/2015 19:51:34 ET JOB#: [id number]  cc: Shaune Pollack, MD, <Dictator> Shaune Pollack MD ELECTRONICALLY SIGNED 01/25/2015 12:40

## 2015-07-17 ENCOUNTER — Encounter: Payer: Self-pay | Admitting: Emergency Medicine

## 2015-07-17 ENCOUNTER — Emergency Department
Admission: EM | Admit: 2015-07-17 | Discharge: 2015-07-17 | Disposition: A | Discharge status: Home / Self Care | Payer: BLUE CROSS/BLUE SHIELD | Attending: Emergency Medicine | Admitting: Emergency Medicine

## 2015-07-17 DIAGNOSIS — R519 Headache, unspecified: Secondary | ICD-10-CM

## 2015-07-17 DIAGNOSIS — R51 Headache: Secondary | ICD-10-CM | POA: Insufficient documentation

## 2015-07-17 DIAGNOSIS — R6884 Jaw pain: Secondary | ICD-10-CM

## 2015-07-17 DIAGNOSIS — K088 Other specified disorders of teeth and supporting structures: Secondary | ICD-10-CM | POA: Diagnosis present

## 2015-07-17 MED ORDER — KETOROLAC TROMETHAMINE 10 MG PO TABS: 10.0000 mg | ORAL_TABLET | Freq: Three times a day (TID) | ORAL | Status: DC

## 2015-07-17 MED ORDER — CYCLOBENZAPRINE HCL 5 MG PO TABS: 5.0000 mg | ORAL_TABLET | Freq: Three times a day (TID) | ORAL | Status: DC | PRN

## 2015-07-17 NOTE — ED Notes (Signed)
Pt feels her dental pain might be causing her headaches.

## 2015-07-17 NOTE — Discharge Instructions (Signed)
Headaches, Frequently Asked Questions °MIGRAINE HEADACHES °Q: What is migraine? What causes it? How can I treat it? °A: Generally, migraine headaches begin as a dull ache. Then they develop into a constant, throbbing, and pulsating pain. You may experience pain at the temples. You may experience pain at the front or back of one or both sides of the head. The pain is usually accompanied by a combination of: °· Nausea. °· Vomiting. °· Sensitivity to light and noise. °Some people (about 15%) experience an aura (see below) before an attack. The cause of migraine is believed to be chemical reactions in the brain. Treatment for migraine may include over-the-counter or prescription medications. It may also include self-help techniques. These include relaxation training and biofeedback.  °Q: What is an aura? °A: About 15% of people with migraine get an "aura". This is a sign of neurological symptoms that occur before a migraine headache. You may see wavy or jagged lines, dots, or flashing lights. You might experience tunnel vision or blind spots in one or both eyes. The aura can include visual or auditory hallucinations (something imagined). It may include disruptions in smell (such as strange odors), taste or touch. Other symptoms include: °· Numbness. °· A "pins and needles" sensation. °· Difficulty in recalling or speaking the correct word. °These neurological events may last as long as 60 minutes. These symptoms will fade as the headache begins. °Q: What is a trigger? °A: Certain physical or environmental factors can lead to or "trigger" a migraine. These include: °· Foods. °· Hormonal changes. °· Weather. °· Stress. °It is important to remember that triggers are different for everyone. To help prevent migraine attacks, you need to figure out which triggers affect you. Keep a headache diary. This is a good way to track triggers. The diary will help you talk to your healthcare professional about your condition. °Q: Does  weather affect migraines? °A: Bright sunshine, hot, humid conditions, and drastic changes in barometric pressure may lead to, or "trigger," a migraine attack in some people. But studies have shown that weather does not act as a trigger for everyone with migraines. °Q: What is the link between migraine and hormones? °A: Hormones start and regulate many of your body's functions. Hormones keep your body in balance within a constantly changing environment. The levels of hormones in your body are unbalanced at times. Examples are during menstruation, pregnancy, or menopause. That can lead to a migraine attack. In fact, about three quarters of all women with migraine report that their attacks are related to the menstrual cycle.  °Q: Is there an increased risk of stroke for migraine sufferers? °A: The likelihood of a migraine attack causing a stroke is very remote. That is not to say that migraine sufferers cannot have a stroke associated with their migraines. In persons under age 40, the most common associated factor for stroke is migraine headache. But over the course of a person's normal life span, the occurrence of migraine headache may actually be associated with a reduced risk of dying from cerebrovascular disease due to stroke.  °Q: What are acute medications for migraine? °A: Acute medications are used to treat the pain of the headache after it has started. Examples over-the-counter medications, NSAIDs, ergots, and triptans.  °Q: What are the triptans? °A: Triptans are the newest class of abortive medications. They are specifically targeted to treat migraine. Triptans are vasoconstrictors. They moderate some chemical reactions in the brain. The triptans work on receptors in your brain. Triptans help   to restore the balance of a neurotransmitter called serotonin. Fluctuations in levels of serotonin are thought to be a main cause of migraine.  °Q: Are over-the-counter medications for migraine effective? °A:  Over-the-counter, or "OTC," medications may be effective in relieving mild to moderate pain and associated symptoms of migraine. But you should see your caregiver before beginning any treatment regimen for migraine.  °Q: What are preventive medications for migraine? °A: Preventive medications for migraine are sometimes referred to as "prophylactic" treatments. They are used to reduce the frequency, severity, and length of migraine attacks. Examples of preventive medications include antiepileptic medications, antidepressants, beta-blockers, calcium channel blockers, and NSAIDs (nonsteroidal anti-inflammatory drugs). °Q: Why are anticonvulsants used to treat migraine? °A: During the past few years, there has been an increased interest in antiepileptic drugs for the prevention of migraine. They are sometimes referred to as "anticonvulsants". Both epilepsy and migraine may be caused by similar reactions in the brain.  °Q: Why are antidepressants used to treat migraine? °A: Antidepressants are typically used to treat people with depression. They may reduce migraine frequency by regulating chemical levels, such as serotonin, in the brain.  °Q: What alternative therapies are used to treat migraine? °A: The term "alternative therapies" is often used to describe treatments considered outside the scope of conventional Western medicine. Examples of alternative therapy include acupuncture, acupressure, and yoga. Another common alternative treatment is herbal therapy. Some herbs are believed to relieve headache pain. Always discuss alternative therapies with your caregiver before proceeding. Some herbal products contain arsenic and other toxins. °TENSION HEADACHES °Q: What is a tension-type headache? What causes it? How can I treat it? °A: Tension-type headaches occur randomly. They are often the result of temporary stress, anxiety, fatigue, or anger. Symptoms include soreness in your temples, a tightening band-like sensation  around your head (a "vice-like" ache). Symptoms can also include a pulling feeling, pressure sensations, and contracting head and neck muscles. The headache begins in your forehead, temples, or the back of your head and neck. Treatment for tension-type headache may include over-the-counter or prescription medications. Treatment may also include self-help techniques such as relaxation training and biofeedback. °CLUSTER HEADACHES °Q: What is a cluster headache? What causes it? How can I treat it? °A: Cluster headache gets its name because the attacks come in groups. The pain arrives with little, if any, warning. It is usually on one side of the head. A tearing or bloodshot eye and a runny nose on the same side of the headache may also accompany the pain. Cluster headaches are believed to be caused by chemical reactions in the brain. They have been described as the most severe and intense of any headache type. Treatment for cluster headache includes prescription medication and oxygen. °SINUS HEADACHES °Q: What is a sinus headache? What causes it? How can I treat it? °A: When a cavity in the bones of the face and skull (a sinus) becomes inflamed, the inflammation will cause localized pain. This condition is usually the result of an allergic reaction, a tumor, or an infection. If your headache is caused by a sinus blockage, such as an infection, you will probably have a fever. An x-ray will confirm a sinus blockage. Your caregiver's treatment might include antibiotics for the infection, as well as antihistamines or decongestants.  °REBOUND HEADACHES °Q: What is a rebound headache? What causes it? How can I treat it? °A: A pattern of taking acute headache medications too often can lead to a condition known as "rebound headache."   A pattern of taking too much headache medication includes taking it more than 2 days per week or in excessive amounts. That means more than the label or a caregiver advises. With rebound  headaches, your medications not only stop relieving pain, they actually begin to cause headaches. Doctors treat rebound headache by tapering the medication that is being overused. Sometimes your caregiver will gradually substitute a different type of treatment or medication. Stopping may be a challenge. Regularly overusing a medication increases the potential for serious side effects. Consult a caregiver if you regularly use headache medications more than 2 days per week or more than the label advises. °ADDITIONAL QUESTIONS AND ANSWERS °Q: What is biofeedback? °A: Biofeedback is a self-help treatment. Biofeedback uses special equipment to monitor your body's involuntary physical responses. Biofeedback monitors: °· Breathing. °· Pulse. °· Heart rate. °· Temperature. °· Muscle tension. °· Brain activity. °Biofeedback helps you refine and perfect your relaxation exercises. You learn to control the physical responses that are related to stress. Once the technique has been mastered, you do not need the equipment any more. °Q: Are headaches hereditary? °A: Four out of five (80%) of people that suffer report a family history of migraine. Scientists are not sure if this is genetic or a family predisposition. Despite the uncertainty, a child has a 50% chance of having migraine if one parent suffers. The child has a 75% chance if both parents suffer.  °Q: Can children get headaches? °A: By the time they reach high school, most young people have experienced some type of headache. Many safe and effective approaches or medications can prevent a headache from occurring or stop it after it has begun.  °Q: What type of doctor should I see to diagnose and treat my headache? °A: Start with your primary caregiver. Discuss his or her experience and approach to headaches. Discuss methods of classification, diagnosis, and treatment. Your caregiver may decide to recommend you to a headache specialist, depending upon your symptoms or other  physical conditions. Having diabetes, allergies, etc., may require a more comprehensive and inclusive approach to your headache. The National Headache Foundation will provide, upon request, a list of NHF physician members in your state. °Document Released: 02/24/2004 Document Revised: 02/26/2012 Document Reviewed: 08/03/2008 °ExitCare® Patient Information ©2015 [employer]. This information is not intended to replace advice given to you by your health care provider. Make sure you discuss any questions you have with your health care provider. ° °General Headache Without Cause °A headache is pain or discomfort felt around the head or neck area. The specific cause of a headache may not be found. There are many causes and types of headaches. A few common ones are: °· Tension headaches. °· Migraine headaches. °· Cluster headaches. °· Chronic daily headaches. °HOME CARE INSTRUCTIONS  °· Keep all follow-up appointments with your caregiver or any specialist referral. °· Only take over-the-counter or prescription medicines for pain or discomfort as directed by your caregiver. °· Lie down in a dark, quiet room when you have a headache. °· Keep a headache journal to find out what may trigger your migraine headaches. For example, write down: °¨ What you eat and drink. °¨ How much sleep you get. °¨ Any change to your diet or medicines. °· Try massage or other relaxation techniques. °· Put ice packs or heat on the head and neck. Use these 3 to 4 times per day for 15 to 20 minutes each time, or as needed. °· Limit stress. °· Sit up   straight, and do not tense your muscles.  Quit smoking if you smoke.  Limit alcohol use.  Decrease the amount of caffeine you drink, or stop drinking caffeine.  Eat and sleep on a regular schedule.  Get 7 to 9 hours of sleep, or as recommended by your caregiver.  Keep lights dim if bright lights bother you and make your headaches worse. SEEK MEDICAL CARE IF:   You have problems with the  medicines you were prescribed.  Your medicines are not working.  You have a change from the usual headache.  You have nausea or vomiting. SEEK IMMEDIATE MEDICAL CARE IF:   Your headache becomes severe.  You have a fever.  You have a stiff neck.  You have loss of vision.  You have muscular weakness or loss of muscle control.  You start losing your balance or have trouble walking.  You feel faint or pass out.  You have severe symptoms that are different from your first symptoms. MAKE SURE YOU:   Understand these instructions.  Will watch your condition.  Will get help right away if you are not doing well or get worse. Document Released: 12/04/2005 Document Revised: 02/26/2012 Document Reviewed: 12/20/2011 [facility] Patient Information 2015 [place], [place]. This information is not intended to replace advice given to you by your health care provider. Make sure you discuss any questions you have with your health care provider.  OPTIONS FOR DENTAL FOLLOW UP CARE  [place] Department of Health and Human Services - Local Safety Net Dental Clinics [email or web address]   [facility] [phone])  Sharl Ma [phone])  [place] [phone])  [facility] [phone])  [facility] [phone]) This clinic caters to the indigent population and is on a lottery system. Location: [employer] [facility], [employer], [address] [place] Clinic Hours: Wednesdays from 6pm - 9pm, patients seen by a lottery system. For dates, call or go to [email or web address] Services: Cleanings, fillings and simple extractions. Payment Options: DENTAL WORK IS FREE OF CHARGE. Bring proof of income or support. Best way to get seen: Arrive at 5:15 pm - this is a lottery, NOT first come/first serve, so arriving earlier will not increase your  chances of being seen.     [facility] [phone] Select option 1 for emergencies   Location: [facility], [place], [address] [place] Clinic Hours: No walk-ins accepted - call the day before to schedule an appointment. Check in times are 9:30 am and 1:30 pm. Services: Simple extractions, temporary fillings, pulpectomy/pulp debridement, uncomplicated abscess drainage. Payment Options: PAYMENT IS DUE AT THE TIME OF SERVICE.  Fee is usually $100-200, additional surgical procedures (e.g. abscess drainage) may be extra. Cash, checks, Visa/MasterCard accepted.  Can file Medicaid if patient is covered for dental - patient should call case worker to check. No discount for [facility] patients. Best way to get seen: MUST call the day before and get onto the schedule. Can usually be seen the next 1-2 days. No walk-ins accepted.     [facility] [phone]   Location: [facility], [address] [place] Clinic Hours: M, W, Th, F 8am or 1:30pm, Tues 9a or 1:30 - first come/first served. Services: Simple extractions, temporary fillings, uncomplicated abscess drainage.  You do not need to be an [facility] resident. Payment Options: PAYMENT IS DUE AT THE TIME OF SERVICE. Dental insurance, otherwise sliding scale -  bring proof of income or support. Depending on income and treatment needed, cost is usually $50-200. Best way to get seen: Arrive early as it is first come/first served.     [facility] [phone]   Location: [address] Clinic Hours: Mon-Thu 8a-5p Services: Most basic dental services including extractions and fillings. Payment Options: PAYMENT IS DUE AT THE TIME OF SERVICE. Sliding scale, up to 50% off - bring proof if income or support. Medicaid with dental option accepted. Best way to get seen: Call to schedule an  appointment, can usually be seen within 2 weeks OR they will try to see walk-ins - show up at 8a or 2p (you may have to wait).     [facility] [phone] [place] RESIDENTS ONLY   Location: [facility], [address], [place], [place] [address] Clinic Hours: By appointment only. Monday - Thursday 8am-5pm, Friday 8am-12pm Services: Cleanings, fillings, extractions. Payment Options: PAYMENT IS DUE AT THE TIME OF SERVICE. Cash, Visa or MasterCard. Sliding scale - $30 minimum per service. Best way to get seen: Come in to office, complete packet and make an appointment - need proof of income or support monies for each household member and proof of [facility] residence. Usually takes about a month to get in.     [address] [facility] [phone]   Location: [address], [facility] Clinic Hours: Walk-in Urgent Care Dental Services are offered Monday-Friday mornings only. The numbers of emergencies accepted daily is limited to the number of providers available. Maximum 15 - Mondays, Wednesdays & Thursdays Maximum 10 - Tuesdays & Fridays Services: You do not need to be a [facility] resident to be seen for a dental emergency. Emergencies are defined as pain, swelling, abnormal bleeding, or dental trauma. Walkins will receive x-rays if needed. NOTE: Dental cleaning is not an emergency. Payment Options: PAYMENT IS DUE AT THE TIME OF SERVICE. Minimum co-pay is $40.00 for uninsured patients. Minimum co-pay is $3.00 for Medicaid with dental coverage. Dental Insurance is accepted and must be presented at time of visit. Medicare does not cover dental. Forms of payment: Cash, credit card, checks. Best way to get seen: If not previously registered with the clinic, walk-in dental registration begins at 7:15 am and is on a first come/first serve basis. If previously registered with the clinic, call to make an appointment.      [facility] [phone] [place] RESIDENTS ONLY   Location: [address] [place], [place] Clinic Hours: Mon-Thu 10a-2p Services: Extractions only! Payment Options: FREE (donations accepted) - bring proof of income or support Best way to get seen: Call and schedule an appointment OR come at 8am on the 1st Monday of every month (except for holidays) when it is first come/first served.     [facility] [phone]   Location: [address] [place], [place] Clinic Hours: Friday mornings Services, Payment Options, Best way to get seen: Call for info   Take the prescription meds as directed for headache pain.  Seek general dental care at one of the clinics listed above.

## 2015-07-17 NOTE — ED Provider Notes (Signed)
[facility] Emergency Department Provider Note ____________________________________________  Time seen: 1420  I have reviewed the triage vital signs and the nursing notes.  HISTORY  Chief Complaint  Headache and Dental Pain  HPI Katie Gilbert is a 24 y.o. female reports to the ED for reports of jaw pain and intermittent headaches. She reports that her headaches seem to be aggravated by jaw pain on the left side. She denies any dental injury, facial or jaw trauma, dental infection or abscess. She also is without any specific dental pain. She is able to eat, swallow, bite and chew without dysfunction at the jaw or throat discomfort. She has dosed Tylenol and ibuprofen for her headaches without significant benefit. She is without any focal weakness, vision changes, syncope, nausea or vomiting.  History reviewed. No pertinent past medical history.  There are no active problems to display for this patient.  History reviewed. No pertinent past surgical history.  Current Outpatient Rx  Name  Route  Sig  Dispense  Refill  . cyclobenzaprine (FLEXERIL) 5 MG tablet   Oral   Take 1 tablet (5 mg total) by mouth every 8 (eight) hours as needed for muscle spasms.   12 tablet   0   . ketorolac (TORADOL) 10 MG tablet   Oral   Take 1 tablet (10 mg total) by mouth every 8 (eight) hours.   15 tablet   0    Allergies Review of patient's allergies indicates no known allergies.  No family history on file.  Social History History  Substance Use Topics  . Smoking status: Never Smoker   . Smokeless tobacco: Not on file  . Alcohol Use: No   Review of Systems  Constitutional: Negative for fever. Eyes: Negative for visual changes. ENT: Negative for sore throat. Cardiovascular: Negative for chest pain. Respiratory: Negative for shortness of breath. Gastrointestinal: Negative for abdominal pain, vomiting and diarrhea. Genitourinary: Negative for  dysuria. Musculoskeletal: Negative for back pain. Skin: Negative for rash. Neurological: Negative for focal weakness or numbness. Reports headache ____________________________________________  PHYSICAL EXAM:  VITAL SIGNS: ED Triage Vitals  Enc Vitals Group     BP 07/17/15 1337 146/93 mmHg     Pulse Rate 07/17/15 1337 98     Resp 07/17/15 1337 18     Temp 07/17/15 1337 97.8 F (36.6 C)     Temp Source 07/17/15 1337 Oral     SpO2 07/17/15 1337 98 %     Weight 07/17/15 1332 284 lb 1.6 oz (128.867 kg)     Height 07/17/15 1332 6\' 3"  (1.905 m)     Head Cir --      Peak Flow --      Pain Score 07/17/15 1334 9     Pain Loc --      Pain Edu? --      Excl. in GC? --    Constitutional: Alert and oriented. Well appearing and in no distress. Eyes: Conjunctivae are normal. PERRL. Normal extraocular movements. ENT   Head: Normocephalic and atraumatic.   Nose: No congestion/rhinnorhea.   Mouth/Throat: Mucous membranes are moist. No dental trauma, normal dentition. Mild tartar along gumline. Erupted molars noted without impaction. No gum tenderness, swelling, or abscess noted. Normal jaw ROM without catch, click, lock. No TMJ tenderness.    Neck: Supple. No thyromegaly. Hematological/Lymphatic/Immunilogical: No cervical lymphadenopathy. Cardiovascular: Normal rate, regular rhythm.  Respiratory: Normal respiratory effort. No wheezes/rales/rhonchi. Gastrointestinal: Soft and nontender. No distention. Musculoskeletal: Nontender with normal range of motion  in all extremities.  Neurologic:  CN II-XII grossly intact. Normal gait without ataxia. Normal speech and language. No gross focal neurologic deficits are appreciated. Skin:  Skin is warm, dry and intact. No rash noted. Psychiatric: Mood and affect are normal. Patient exhibits appropriate insight and judgment. _______________________________________________________  INITIAL IMPRESSION / ASSESSMENT AND PLAN / ED COURSE  Headache  of unknown origin and jaw pain not associated with dental infection. Suggest general dental care at a local clinic. Prescriptions for Toradol and Flexeril.  Dental clinic list provided.  ____________________________________________  FINAL CLINICAL IMPRESSION(S) / ED DIAGNOSES  Final diagnoses:  Jaw pain, non-TMJ  Acute nonintractable headache, unspecified headache type      Lissa Hoard, PA-C 07/17/15 1502  Jene Every, MD 07/17/15 [phone]

## 2015-09-05 ENCOUNTER — Encounter: Payer: Self-pay | Admitting: *Deleted

## 2015-09-05 ENCOUNTER — Emergency Department
Admission: EM | Admit: 2015-09-05 | Discharge: 2015-09-05 | Disposition: A | Discharge status: Home / Self Care | Payer: BLUE CROSS/BLUE SHIELD | Attending: Emergency Medicine | Admitting: Emergency Medicine

## 2015-09-05 DIAGNOSIS — K029 Dental caries, unspecified: Secondary | ICD-10-CM | POA: Diagnosis not present

## 2015-09-05 DIAGNOSIS — K088 Other specified disorders of teeth and supporting structures: Secondary | ICD-10-CM | POA: Diagnosis present

## 2015-09-05 DIAGNOSIS — K0889 Other specified disorders of teeth and supporting structures: Secondary | ICD-10-CM

## 2015-09-05 MED ORDER — LIDOCAINE-EPINEPHRINE 2 %-1:100000 IJ SOLN
1.7000 mL | Freq: Once | INTRAMUSCULAR | Status: AC
  Administered 2015-09-05: 1.7 mL
  Filled 2015-09-05: qty 1.7

## 2015-09-05 MED ORDER — PENICILLIN V POTASSIUM 500 MG PO TABS: 500.0000 mg | ORAL_TABLET | Freq: Four times a day (QID) | ORAL | Status: DC

## 2015-09-05 MED ORDER — PENICILLIN V POTASSIUM 500 MG PO TABS
500.0000 mg | ORAL_TABLET | Freq: Once | ORAL | Status: AC
  Administered 2015-09-05: 500 mg via ORAL
  Filled 2015-09-05: qty 1

## 2015-09-05 MED ORDER — IBUPROFEN 800 MG PO TABS
800.0000 mg | ORAL_TABLET | Freq: Once | ORAL | Status: AC
  Administered 2015-09-05: 800 mg via ORAL
  Filled 2015-09-05: qty 1

## 2015-09-05 MED ORDER — TRAMADOL HCL 50 MG PO TABS: 50.0000 mg | ORAL_TABLET | Freq: Four times a day (QID) | ORAL | Status: DC | PRN

## 2015-09-05 MED ORDER — IBUPROFEN 800 MG PO TABS: 800.0000 mg | ORAL_TABLET | Freq: Three times a day (TID) | ORAL | Status: DC | PRN

## 2015-09-05 NOTE — ED Notes (Signed)
AaoX3.  SKIN WARM AND DRY.  NAD 

## 2015-09-05 NOTE — Discharge Instructions (Signed)
Dental Caries °Dental caries (also called tooth decay) is the most common oral disease. It can occur at any age but is more common in children and young adults.  °HOW DENTAL CARIES DEVELOPS  °The process of decay begins when bacteria and foods (particularly sugars and starches) combine in your mouth to produce plaque. Plaque is a substance that sticks to the hard, outer surface of a tooth (enamel). The bacteria in plaque produce acids that attack enamel. These acids may also attack the root surface of a tooth (cementum) if it is exposed. Repeated attacks dissolve these surfaces and create holes in the tooth (cavities). If left untreated, the acids destroy the other layers of the tooth.  °RISK FACTORS °· Frequent sipping of sugary beverages.   °· Frequent snacking on sugary and starchy foods, especially those that easily get stuck in the teeth.   °· Poor oral hygiene.   °· Dry mouth.   °· Substance abuse such as methamphetamine abuse.   °· Broken or poor-fitting dental restorations.   °· Eating disorders.   °· Gastroesophageal reflux disease (GERD).   °· Certain radiation treatments to the head and neck. °SYMPTOMS °In the early stages of dental caries, symptoms are seldom present. Sometimes white, chalky areas may be seen on the enamel or other tooth layers. In later stages, symptoms may include: °· Pits and holes on the enamel. °· Toothache after sweet, hot, or cold foods or drinks are consumed. °· Pain around the tooth. °· Swelling around the tooth. °DIAGNOSIS  °Most of the time, dental caries is detected during a regular dental checkup. A diagnosis is made after a thorough medical and dental history is taken and the surfaces of your teeth are checked for signs of dental caries. Sometimes special instruments, such as lasers, are used to check for dental caries. Dental X-ray exams may be taken so that areas not visible to the eye (such as between the contact areas of the teeth) can be checked for cavities.    °TREATMENT  °If dental caries is in its early stages, it may be reversed with a fluoride treatment or an application of a remineralizing agent at the dental office. Thorough brushing and flossing at home is needed to aid these treatments. If it is in its later stages, treatment depends on the location and extent of tooth destruction:  °· If a small area of the tooth has been destroyed, the destroyed area will be removed and cavities will be filled with a material such as gold, silver amalgam, or composite resin.   °· If a large area of the tooth has been destroyed, the destroyed area will be removed and a cap (crown) will be fitted over the remaining tooth structure.   °· If the center part of the tooth (pulp) is affected, a procedure called a root canal will be needed before a filling or crown can be placed.   °· If most of the tooth has been destroyed, the tooth may need to be pulled (extracted). °HOME CARE INSTRUCTIONS °You can prevent, stop, or reverse dental caries at home by practicing good oral hygiene. Good oral hygiene includes: °· Thoroughly cleaning your teeth at least twice a day with a toothbrush and dental floss.   °· Using a fluoride toothpaste. A fluoride mouth rinse may also be used if recommended by your dentist or health care provider.   °· Restricting the amount of sugary and starchy foods and sugary liquids you consume.   °· Avoiding frequent snacking on these foods and sipping of these liquids.   °· Keeping regular visits with   a dentist for checkups and cleanings. °PREVENTION  °· Practice good oral hygiene. °· Consider a dental sealant. A dental sealant is a coating material that is applied by your dentist to the pits and grooves of teeth. The sealant prevents food from being trapped in them. It may protect the teeth for several years. °· Ask about fluoride supplements if you live in a community without fluorinated water or with water that has a low fluoride content. Use fluoride supplements  as directed by your dentist or health care provider. °· Allow fluoride varnish applications to teeth if directed by your dentist or health care provider. °Document Released: 08/26/2002 Document Revised: 04/20/2014 Document Reviewed: 12/06/2012 °ExitCare® Patient Information ©2015 [employer]. This information is not intended to replace advice given to you by your health care provider. Make sure you discuss any questions you have with your health care provider. ° °Dental Pain °A tooth ache may be caused by cavities (tooth decay). Cavities expose the nerve of the tooth to air and hot or cold temperatures. It may come from an infection or abscess (also called a boil or furuncle) around your tooth. It is also often caused by dental caries (tooth decay). This causes the pain you are having. °DIAGNOSIS  °Your caregiver can diagnose this problem by exam. °TREATMENT  °· If caused by an infection, it may be treated with medications which kill germs (antibiotics) and pain medications as prescribed by your caregiver. Take medications as directed. °· Only take over-the-counter or prescription medicines for pain, discomfort, or fever as directed by your caregiver. °· Whether the tooth ache today is caused by infection or dental disease, you should see your dentist as soon as possible for further care. °SEEK MEDICAL CARE IF: °The exam and treatment you received today has been provided on an emergency basis only. This is not a substitute for complete medical or dental care. If your problem worsens or new problems (symptoms) appear, and you are unable to meet with your dentist, call or return to this location. °SEEK IMMEDIATE MEDICAL CARE IF:  °· You have a fever. °· You develop redness and swelling of your face, jaw, or neck. °· You are unable to open your mouth. °· You have severe pain uncontrolled by pain medicine. °MAKE SURE YOU:  °· Understand these instructions. °· Will watch your condition. °· Will get help right away if  you are not doing well or get worse. °Document Released: 12/04/2005 Document Revised: 02/26/2012 Document Reviewed: 07/22/2008 °ExitCare® Patient Information ©2015 [employer]. This information is not intended to replace advice given to you by your health care provider. Make sure you discuss any questions you have with your health care provider. ° °

## 2015-09-05 NOTE — ED Provider Notes (Signed)
CSN: [id number]     Arrival date & time 09/05/15  1715 History   First MD Initiated Contact with Patient 09/05/15 1743     Chief Complaint  Patient presents with  . Dental Pain     (Consider location/radiation/quality/duration/timing/severity/associated sxs/prior Treatment) HPI  24 year old female presents to the emergency department for evaluation of right upper tooth pain. No trauma or injury. Patient describes the pain as a throbbing sensation that is constant and getting worse over the last 5 days. She denies any fevers or inability to swallow. She is tolerating by mouth well. No nausea or vomiting. She has not tried any medications for pain.  History reviewed. No pertinent past medical history. History reviewed. No pertinent past surgical history. History reviewed. No pertinent family history. Social History  Substance Use Topics  . Smoking status: Never Smoker   . Smokeless tobacco: None  . Alcohol Use: No   OB History    No data available     Review of Systems  Constitutional: Negative.  Negative for fever and chills.  HENT: Positive for dental problem and facial swelling. Negative for drooling, mouth sores, trouble swallowing and voice change.   Respiratory: Negative for chest tightness and shortness of breath.   Cardiovascular: Negative for chest pain.  Gastrointestinal: Negative for nausea, vomiting, abdominal pain and diarrhea.  Musculoskeletal: Negative for arthralgias, neck pain and neck stiffness.  Skin: Negative.   Psychiatric/Behavioral: Negative for confusion.  All other systems reviewed and are negative.     Allergies  Review of patient's allergies indicates no known allergies.  Home Medications   Prior to Admission medications   Medication Sig Start Date End Date Taking? Authorizing Provider  cyclobenzaprine (FLEXERIL) 5 MG tablet Take 1 tablet (5 mg total) by mouth every 8 (eight) hours as needed for muscle spasms. 07/17/15   Jenise V Bacon Menshew,  PA-C  ibuprofen (ADVIL,MOTRIN) 800 MG tablet Take 1 tablet (800 mg total) by mouth every 8 (eight) hours as needed. 09/05/15   Evon Slack, PA-C  ketorolac (TORADOL) 10 MG tablet Take 1 tablet (10 mg total) by mouth every 8 (eight) hours. 07/17/15   Jenise V Bacon Menshew, PA-C  penicillin v potassium (VEETID) 500 MG tablet Take 1 tablet (500 mg total) by mouth 4 (four) times daily. 09/05/15   Evon Slack, PA-C  traMADol (ULTRAM) 50 MG tablet Take 1 tablet (50 mg total) by mouth every 6 (six) hours as needed. 09/05/15   Evon Slack, PA-C   BP 126/88 mmHg  Pulse 74  Temp(Src) 98.4 F (36.9 C) (Oral)  Resp 18  Ht  (1.905 m)  Wt 280 lb (127.007 kg)  BMI 35.00 kg/m2  SpO2 98% Physical Exam  Constitutional: She is oriented to person, place, and time. She appears well-developed and well-nourished. No distress.  HENT:  Head: Normocephalic and atraumatic.  Right Ear: External ear normal.  Left Ear: External ear normal.  Nose: Nose normal.  Mouth/Throat: Uvula is midline and oropharynx is clear and moist. No oral lesions. No trismus in the jaw. Normal dentition. Dental abscesses and dental caries present. No uvula swelling. No oropharyngeal exudate, posterior oropharyngeal edema, posterior oropharyngeal erythema or tonsillar abscesses.    Eyes: EOM are normal.  Neck: Normal range of motion. Neck supple.  Cardiovascular: Normal rate.  Exam reveals no gallop and no friction rub.   No murmur heard. Pulmonary/Chest: Effort normal and breath sounds normal. No respiratory distress.  Neurological: She is alert and oriented to  person, place, and time.  Skin: Skin is warm and dry.  Psychiatric: She has a normal mood and affect. Her behavior is normal. Thought content normal.    ED Course  Procedures (including critical care time) Dental block: 1.7 mL stone 1% lidocaine with epinephrine was injected along tooth #5 around the nerve root. Patient tolerated the procedure well.   Labs  Review Labs Reviewed - No data to display  Imaging Review No results found. I have personally reviewed and evaluated these images and lab results as part of my medical decision-making.   EKG Interpretation None      MDM   Final diagnoses:  Pain, dental  Dental caries    24 year old female with dental pain. Patient is placed on antibiotic's, ibuprofen and tramadol for pain. She received a dental block in the emergency department is significant improved her pain patient will make follow-up with dentist. Return to the ER for any worsening symptoms or urgent changes in health.    Evon Slack, PA-C 09/05/15 1757  Emily Filbert, MD 09/05/15 [phone]

## 2015-09-05 NOTE — ED Notes (Signed)
Pt states right sided gum/ mouth pain for 4 days, with a headache on the right side, sattes she has been using ora-gel but with no success, denies any know broken tooth

## 2016-04-18 ENCOUNTER — Emergency Department
Admission: EM | Admit: 2016-04-18 | Discharge: 2016-04-18 | Disposition: A | Discharge status: Home / Self Care | Payer: BLUE CROSS/BLUE SHIELD | Attending: Emergency Medicine | Admitting: Emergency Medicine

## 2016-04-18 ENCOUNTER — Encounter: Payer: Self-pay | Admitting: Emergency Medicine

## 2016-04-18 DIAGNOSIS — K029 Dental caries, unspecified: Secondary | ICD-10-CM

## 2016-04-18 DIAGNOSIS — K0889 Other specified disorders of teeth and supporting structures: Secondary | ICD-10-CM | POA: Insufficient documentation

## 2016-04-18 MED ORDER — PENICILLIN V POTASSIUM 500 MG PO TABS: 500.0000 mg | ORAL_TABLET | Freq: Four times a day (QID) | ORAL | Status: DC

## 2016-04-18 MED ORDER — LIDOCAINE VISCOUS 2 % MT SOLN
15.0000 mL | Freq: Once | OROMUCOSAL | Status: AC
  Administered 2016-04-18: 15 mL via OROMUCOSAL
  Filled 2016-04-18: qty 15

## 2016-04-18 MED ORDER — IBUPROFEN 600 MG PO TABS: 600.0000 mg | ORAL_TABLET | Freq: Three times a day (TID) | ORAL | Status: DC | PRN

## 2016-04-18 NOTE — Discharge Instructions (Signed)
Dental Caries Dental caries is tooth decay. This decay can cause a hole in teeth (cavity) that can get bigger and deeper over time. HOME CARE  Brush and floss your teeth. Do this at least two times a day.  Use a fluoride toothpaste.  Use a mouth rinse if told by your dentist or doctor.  Eat less sugary and starchy foods. Drink less sugary drinks.  Avoid snacking often on sugary and starchy foods. Avoid sipping often on sugary drinks.  Keep regular checkups and cleanings with your dentist.  Use fluoride supplements if told by your dentist or doctor.  Allow fluoride to be applied to teeth if told by your dentist or doctor.   This information is not intended to replace advice given to you by your health care provider. Make sure you discuss any questions you have with your health care provider.   Document Released: 09/12/2008 Document Revised: 12/25/2014 Document Reviewed: 12/06/2012 Elsevier Interactive Patient Education 2016 [employer].    Call clinics listed below to see if they accept insurance. If they do not accept insurance then you'll need to call Dentist  listed in the phone book.  OPTIONS FOR DENTAL FOLLOW UP CARE  [place] [facility] Health and [facility] Services - Local Safety Net Dental Clinics [email or web address]   [facility] [phone])  [facility]arl Ma [phone])  [place] [phone])  [facility] [phone])  [facility] [phone]) This clinic caters to the indigent population and is on a lottery system. Location: [employer] of Dentistry, [employer], [address] [place] Clinic Hours: Wednesdays from 6pm - 9pm, patients seen by a lottery system. For dates, call or go to [email or web address] Services: Cleanings, fillings and simple extractions. Payment Options: DENTAL WORK IS FREE OF CHARGE.  Bring proof of income or support. Best way to get seen: Arrive at 5:15 pm - this is a lottery, NOT first come/first serve, so arriving earlier will not increase your chances of being seen.     [facility] [phone] Select option 1 for emergencies   Location: [facility]istry, [place], [address] [place] Clinic Hours: No walk-ins accepted - call the day before to schedule an appointment. Check in times are 9:30 am and 1:30 pm. Services: Simple extractions, temporary fillings, pulpectomy/pulp debridement, uncomplicated abscess drainage. Payment Options: PAYMENT IS DUE AT THE TIME OF SERVICE.  Fee is usually $100-200, additional surgical procedures (e.g. abscess drainage) may be extra. Cash, checks, Visa/MasterCard accepted.  Can file Medicaid if patient is covered for dental - patient should call case worker to check. No discount for [facility] patients. Best way to get seen: MUST call the day before and get onto the schedule. Can usually be seen the next 1-2 days. No walk-ins accepted.     [facility] [phone]   Location: [facility], [address], [place] Clinic Hours: M, W, Th, F 8am or 1:30pm, Tues 9a or 1:30 - first come/first served. Services: Simple extractions, temporary fillings, uncomplicated abscess drainage.  You do not need to be an [facility] resident. Payment Options: PAYMENT IS DUE AT THE TIME OF SERVICE. Dental insurance, otherwise sliding scale - bring proof of income or support. Depending on income and treatment needed, cost is usually $50-200. Best way to get seen: Arrive early as it is first come/first served.     [facility] [phone]   Location:  [address] Clinic Hours: Mon-Thu 8a-5p Services: Most basic dental services including extractions and fillings. Payment Options: PAYMENT IS DUE AT THE  TIME OF SERVICE. Sliding scale, up to 50% off - bring proof if income or support. Medicaid with dental option accepted. Best way to get seen: Call to schedule an appointment, can usually be seen within 2 weeks OR they will try to see walk-ins - show up at 8a or 2p (you may have to wait).     [facility] [phone] [place] RESIDENTS ONLY   Location: [facility], [address], [place], [place] [id number] Clinic Hours: By appointment only. Monday - Thursday 8am-5pm, Friday 8am-12pm Services: Cleanings, fillings, extractions. Payment Options: PAYMENT IS DUE AT THE TIME OF SERVICE. Cash, Visa or MasterCard. Sliding scale - $30 minimum per service. Best way to get seen: Come in to office, complete packet and make an appointment - need proof of income or support monies for each household member and proof of [place] residence. Usually takes about a month to get in.     [facility] [phone]   Location: [address] [facility] Hours: Walk-in Urgent Care Dental Services are offered Monday-Friday mornings only. The numbers of emergencies accepted daily is limited to the number of providers available. Maximum 15 - Mondays, Wednesdays & Thursdays Maximum 10 - Tuesdays & Fridays Services: You do not need to be a [place] resident to be seen for a dental emergency. Emergencies are defined as pain, swelling, abnormal bleeding, or dental trauma. Walkins will receive x-rays if needed. NOTE: Dental cleaning is not an emergency. Payment Options: PAYMENT IS DUE AT THE TIME OF SERVICE. Minimum co-pay is $40.00 for uninsured patients. Minimum co-pay is $3.00 for Medicaid with dental coverage. Dental Insurance is accepted and must be presented at time of visit. Medicare does not cover dental. Forms of payment: Cash, credit card, checks. Best way to get seen: If not previously registered with the  clinic, walk-in dental registration begins at 7:15 am and is on a first come/first serve basis. If previously registered with the clinic, call to make an appointment.     [facility] [phone] [place] RESIDENTS ONLY   Location: [address], [place], [place] Clinic Hours: Mon-Thu 10a-2p Services: Extractions only! Payment Options: FREE (donations accepted) - bring proof of income or support Best way to get seen: Call and schedule an appointment OR come at 8am on the 1st Monday of every month (except for holidays) when it is first come/first served.     [facility] [phone]   Location: [address], [place] Clinic Hours: Friday mornings Services, Payment Options, Best way to get seen: Call for info

## 2016-04-18 NOTE — ED Provider Notes (Signed)
[facility] Emergency Department Provider Note   ____________________________________________  Time seen: Approximately 7:57 PM  I have reviewed the triage vital signs and the nursing notes.   HISTORY  Chief Complaint Dental Pain   HPI Katie Gilbert is a 25 y.o. female is here with complaint of left upper and lower teeth and gum pain for the last 3 days. Patient has been using over-the-counter preparations without any relief. She states she continues to feel like a "throbbing sensation". Patient currently does not have a dentist.Patient is a nonsmoker. She rates her pain as a 9/10.   History reviewed. No pertinent past medical history.  There are no active problems to display for this patient.   History reviewed. No pertinent past surgical history.  Current Outpatient Rx  Name  Route  Sig  Dispense  Refill  . ibuprofen (ADVIL,MOTRIN) 600 MG tablet   Oral   Take 1 tablet (600 mg total) by mouth every 8 (eight) hours as needed.   30 tablet   0   . penicillin v potassium (VEETID) 500 MG tablet   Oral   Take 1 tablet (500 mg total) by mouth 4 (four) times daily.   40 tablet   0     Allergies Review of patient's allergies indicates no known allergies.  No family history on file.  Social History Social History  Substance Use Topics  . Smoking status: Never Smoker   . Smokeless tobacco: None  . Alcohol Use: No    Review of Systems Constitutional: No fever/chills ENT: No sore throat. Positive dental pain. Cardiovascular: Denies chest pain. Respiratory: Denies shortness of breath. Gastrointestinal:  No nausea, no vomiting.   Skin: Negative for rash. Neurological: Negative for headaches, focal weakness or numbness.  10-point ROS otherwise negative.  ____________________________________________   PHYSICAL EXAM:  VITAL SIGNS: ED Triage Vitals  Enc Vitals Group     BP 04/18/16 1921 139/99 mmHg     Pulse Rate 04/18/16 1921 87      Resp 04/18/16 1921 20     Temp 04/18/16 1921 99 F (37.2 C)     Temp Source 04/18/16 1921 Oral     SpO2 04/18/16 1921 98 %     Weight 04/18/16 1921 278 lb 11.2 oz (126.417 kg)     Height 04/18/16 1921 6\' 3"  (1.905 m)     Head Cir --      Peak Flow --      Pain Score 04/18/16 1919 9     Pain Loc --      Pain Edu? --      Excl. in GC? --     Constitutional: Alert and oriented. Well appearing and in no acute distress. Eyes: Conjunctivae are normal. PERRL. EOMI. Head: Atraumatic. Minimal facial edema is present on the left. Nose: No congestion/rhinnorhea. Mouth/Throat: Mucous membranes are moist.  Oropharynx non-erythematous.Left upper and lower gums are tender to palpation with tongue depressor. There is no obvious abscess formation. There is a cavity that is present more molar posteriorly. No drainage was noted. Neck: No stridor.   Hematological/Lymphatic/Immunilogical: No cervical lymphadenopathy. Cardiovascular: Normal rate, regular rhythm. Grossly normal heart sounds.  Good peripheral circulation. Respiratory: Normal respiratory effort.  No retractions. Lungs CTAB. Musculoskeletal: Moves upper and lower extremities without any difficulty. Normal gait was noted. Neurologic:  Normal speech and language. No gross focal neurologic deficits are appreciated. No gait instability. Skin:  Skin is warm, dry and intact. No rash noted. Psychiatric: Mood and  affect are normal. Speech and behavior are normal.  ____________________________________________   LABS (all labs ordered are listed, but only abnormal results are displayed)  Labs Reviewed - No data to display  PROCEDURES  Procedure(s) performed: None  Critical Care performed: No  ____________________________________________   INITIAL IMPRESSION / ASSESSMENT AND PLAN / ED COURSE  Pertinent labs & imaging results that were available during my care of the patient were reviewed by me and considered in my medical decision  making (see chart for details).  Patient was placed on Pen-Vee K 500 mg 4 times a day for 10 days. Patient was given information about dental clinics. She is also take ibuprofen 600 mg every 8 hours as needed for pain and inflammation. ____________________________________________   FINAL CLINICAL IMPRESSION(S) / ED DIAGNOSES  Final diagnoses:  Dental caries  Pain due to dental caries      NEW MEDICATIONS STARTED DURING THIS VISIT:  Discharge Medication List as of 04/18/2016  8:31 PM       Note:  This document was prepared using Dragon voice recognition software and may include unintentional dictation errors.    Tommi Rumps, PA-C 04/18/16 2347  Arnaldo Natal, MD 04/20/16 [phone]

## 2016-04-18 NOTE — ED Notes (Signed)
Pt states upper and lower L teeth/gum pain x 3 days. Pt states orajel has not helped with pain. States pain feels like it's throbbing.

## 2016-04-18 NOTE — ED Notes (Signed)
Patient ambulatory to triage with steady gait, without difficulty or distress noted; pt reports left sided lower and lower dental pain x 3 days

## 2016-11-19 ENCOUNTER — Encounter: Payer: Self-pay | Admitting: Emergency Medicine

## 2016-11-19 DIAGNOSIS — Y9389 Activity, other specified: Secondary | ICD-10-CM | POA: Diagnosis not present

## 2016-11-19 DIAGNOSIS — Z791 Long term (current) use of non-steroidal anti-inflammatories (NSAID): Secondary | ICD-10-CM | POA: Insufficient documentation

## 2016-11-19 DIAGNOSIS — Y990 Civilian activity done for income or pay: Secondary | ICD-10-CM | POA: Insufficient documentation

## 2016-11-19 DIAGNOSIS — R51 Headache: Secondary | ICD-10-CM | POA: Diagnosis present

## 2016-11-19 DIAGNOSIS — S060X0A Concussion without loss of consciousness, initial encounter: Secondary | ICD-10-CM | POA: Diagnosis not present

## 2016-11-19 DIAGNOSIS — W228XXA Striking against or struck by other objects, initial encounter: Secondary | ICD-10-CM | POA: Insufficient documentation

## 2016-11-19 DIAGNOSIS — Y929 Unspecified place or not applicable: Secondary | ICD-10-CM | POA: Diagnosis not present

## 2016-11-19 NOTE — ED Triage Notes (Signed)
Pt c/o intermittent frontal headache since yesterday; will go away with medication but always returns; slight sensitivity to lights; denies N/V

## 2016-11-20 ENCOUNTER — Emergency Department
Admission: EM | Admit: 2016-11-20 | Discharge: 2016-11-20 | Disposition: A | Discharge status: Home / Self Care | Payer: BLUE CROSS/BLUE SHIELD | Attending: Emergency Medicine | Admitting: Emergency Medicine

## 2016-11-20 DIAGNOSIS — S060X0A Concussion without loss of consciousness, initial encounter: Secondary | ICD-10-CM

## 2016-11-20 DIAGNOSIS — R519 Headache, unspecified: Secondary | ICD-10-CM

## 2016-11-20 DIAGNOSIS — R51 Headache: Secondary | ICD-10-CM

## 2016-11-20 MED ORDER — KETOROLAC TROMETHAMINE 30 MG/ML IJ SOLN
30.0000 mg | Freq: Once | INTRAMUSCULAR | Status: AC
  Administered 2016-11-20: 30 mg via INTRAMUSCULAR
  Filled 2016-11-20: qty 1

## 2016-11-20 NOTE — Discharge Instructions (Signed)
1. You may alternate Tylenol and Motrin as needed for headache. 2. Return to the ER for worsening symptoms, persistent vomiting, difficulty breathing or other concerns.

## 2016-11-20 NOTE — ED Provider Notes (Signed)
[facility] Emergency Department Provider Note   ____________________________________________   First MD Initiated Contact with Patient 11/20/16 0255     (approximate)  I have reviewed the triage vital signs and the nursing notes.   HISTORY  Chief Complaint Headache    HPI Stark KleinLashante I Imran is a 25 y.o. female who presents to the ED from home with a chief complaint of headache. Patient suffered minor head injury to her forehead when she stood up and struck her head on a machine while at work 2 nights ago. Complains of intermittent frontal headache since yesterday; headache resolves with Motrin but returns. Symptoms associated with mild photophobia. Denies associated neck pain, nausea/vomiting, dizziness. Denies fever, chills, chest pain, shortness of breath, abdominal pain, dysuria, diarrhea. Denies recent travel. Nothing makes her symptoms worse. Motrin makes her symptoms better.   Past Medical History None  There are no active problems to display for this patient.   History reviewed. No pertinent surgical history.  Prior to Admission medications   Medication Sig Start Date End Date Taking? Authorizing Provider  ibuprofen (ADVIL,MOTRIN) 600 MG tablet Take 1 tablet (600 mg total) by mouth every 8 (eight) hours as needed. 04/18/16  Yes Tommi Rumpshonda L Summers, PA-C    Allergies Patient has no known allergies.  History reviewed. No pertinent family history.  Social History Social History  Substance Use Topics  . Smoking status: Never Smoker  . Smokeless tobacco: Never Used  . Alcohol use No    Review of Systems  Constitutional: No fever/chills. Eyes: No visual changes. ENT: No sore throat. Cardiovascular: Denies chest pain. Respiratory: Denies shortness of breath. Gastrointestinal: No abdominal pain.  No nausea, no vomiting.  No diarrhea.  No constipation. Genitourinary: Negative for dysuria. Musculoskeletal: Negative for back pain. Skin:  Negative for rash. Neurological: Positive for headache. Negative for focal weakness or numbness.  10-point ROS otherwise negative.  ____________________________________________   PHYSICAL EXAM:  VITAL SIGNS: ED Triage Vitals   Enc Vitals Group     BP (!) 128/93     Pulse Rate 80     Resp 18     Temp 98.5 F (36.9 C)     Temp Source Oral     SpO2 100 %     Weight 280 lb (127 kg)     Height 6\' 3"  (1.905 m)     Head Circumference      Peak Flow      Pain Score 8     Pain Loc      Pain Edu?      Excl. in GC?      Constitutional: Alert and oriented. Well appearing and in no acute distress. Eyes: Conjunctivae are normal. PERRL. EOMI. Head: Atraumatic. Forehead mildly swollen and tender to palpation. Nose: No congestion/rhinnorhea. Mouth/Throat: Mucous membranes are moist.  Oropharynx non-erythematous. Neck: No stridor.  No cervical spine tenderness to palpation.  Supple neck without meningismus. Cardiovascular: Normal rate, regular rhythm. Grossly normal heart sounds.  Good peripheral circulation. Respiratory: Normal respiratory effort.  No retractions. Lungs CTAB. Gastrointestinal: Soft and nontender. No distention. No abdominal bruits. No CVA tenderness. Musculoskeletal: No lower extremity tenderness nor edema.  No joint effusions. Neurologic:  CN II-XII. Normal speech and language. No gross focal neurologic deficits are appreciated. No gait instability. Skin:  Skin is warm, dry and intact. No rash noted. No petechiae. Psychiatric: Mood and affect are normal. Speech and behavior are normal.  ____________________________________________   LABS (all labs ordered are listed,  but only abnormal results are displayed)  Labs Reviewed - No data to display ____________________________________________  EKG  None ____________________________________________  RADIOLOGY  None ____________________________________________   PROCEDURES  Procedure(s) performed:  None  Procedures  Critical Care performed: No  ____________________________________________   INITIAL IMPRESSION / ASSESSMENT AND PLAN / ED COURSE  Pertinent labs & imaging results that were available during my care of the patient were reviewed by me and considered in my medical decision making (see chart for details).  25 year old female who presents with frontal headache after minor head injury. No focal neurological deficits on exam. Will administer NSAIDs; post-concussive precautions given. Strict return precautions given. Patient verbalizes understanding and agrees with plan.  Clinical Course      ____________________________________________   FINAL CLINICAL IMPRESSION(S) / ED DIAGNOSES  Final diagnoses:  Acute nonintractable headache, unspecified headache type  Concussion without loss of consciousness, initial encounter      NEW MEDICATIONS STARTED DURING THIS VISIT:  New Prescriptions   No medications on file     Note:  This document was prepared using Dragon voice recognition software and may include unintentional dictation errors.    Irean HongJade J Sung, MD 11/20/16 [phone]

## 2016-12-23 ENCOUNTER — Encounter: Payer: Self-pay | Admitting: Emergency Medicine

## 2016-12-23 ENCOUNTER — Emergency Department
Admission: EM | Admit: 2016-12-23 | Discharge: 2016-12-23 | Disposition: A | Discharge status: Home / Self Care | Payer: BLUE CROSS/BLUE SHIELD | Attending: Emergency Medicine | Admitting: Emergency Medicine

## 2016-12-23 DIAGNOSIS — B9789 Other viral agents as the cause of diseases classified elsewhere: Secondary | ICD-10-CM

## 2016-12-23 DIAGNOSIS — J069 Acute upper respiratory infection, unspecified: Secondary | ICD-10-CM | POA: Insufficient documentation

## 2016-12-23 MED ORDER — PSEUDOEPH-BROMPHEN-DM 30-2-10 MG/5ML PO SYRP: 5.0000 mL | ORAL_SOLUTION | Freq: Four times a day (QID) | ORAL | 0 refills | Status: DC | PRN

## 2016-12-23 NOTE — ED Provider Notes (Signed)
[facility] Emergency Department Provider Note   ____________________________________________   First MD Initiated Contact with Patient 12/23/16 1406     (approximate)  I have reviewed the triage vital signs and the nursing notes.   HISTORY  Chief Complaint Cough   HPI Katie Gilbert is a 26 y.o. female patient complaining a nonproductive cough for 3 days. Patient also state nasal congestion and postnasal drainage. Patient denies any fever associated this complaint. Patient denies nausea, vomiting, or diarrhea. Patient states no palliative measures for this complaint.   History reviewed. No pertinent past medical history.  There are no active problems to display for this patient.   History reviewed. No pertinent surgical history.  Prior to Admission medications   Medication Sig Start Date End Date Taking? Authorizing Provider  brompheniramine-pseudoephedrine-DM 30-2-10 MG/5ML syrup Take 5 mLs by mouth 4 (four) times daily as needed. 12/23/16   Joni Reiningonald K Shuan Statzer, PA-C  ibuprofen (ADVIL,MOTRIN) 600 MG tablet Take 1 tablet (600 mg total) by mouth every 8 (eight) hours as needed. 04/18/16   Tommi Rumpshonda L Summers, PA-C    Allergies Patient has no known allergies.  No family history on file.  Social History Social History  Substance Use Topics  . Smoking status: Never Smoker  . Smokeless tobacco: Never Used  . Alcohol use No    Review of Systems Constitutional: No fever/chills Eyes: No visual changes. ENT: No sore throat. Cardiovascular: Denies chest pain. Respiratory: Denies shortness of breath. Nonproductive cough Gastrointestinal: No abdominal pain.  No nausea, no vomiting.  No diarrhea.  No constipation. Genitourinary: Negative for dysuria. Musculoskeletal: Chest wall pain secondary to cough Skin: Negative for rash. Neurological: Negative for headaches, focal weakness or numbness.   ____________________________________________   PHYSICAL  EXAM:  VITAL SIGNS: ED Triage Vitals [12/23/16 1310]  Enc Vitals Group     BP (!) 137/100     Pulse Rate (!) 110     Resp 20     Temp 97.8 F (36.6 C)     Temp Source Oral     SpO2 95 %     Weight 280 lb (127 kg)     Height 6\' 3"  (1.905 m)     Head Circumference      Peak Flow      Pain Score      Pain Loc      Pain Edu?      Excl. in GC?     Constitutional: Alert and oriented. Well appearing and in no acute distress. Eyes: Conjunctivae are normal. PERRL. EOMI. Head: Atraumatic. Nose: Edematous nasal turbinates clear rhinorrhea. Mouth/Throat: Mucous membranes are moist.  Oropharynx non-erythematous. Postnasal drainage Neck: No stridor.  No cervical spine tenderness to palpation. Hematological/Lymphatic/Immunilogical: No cervical lymphadenopathy. Cardiovascular: Normal rate, regular rhythm. Grossly normal heart sounds.  Good peripheral circulation. Respiratory: Normal respiratory effort.  No retractions. Lungs CTAB. Gastrointestinal: Soft and nontender. No distention. No abdominal bruits. No CVA tenderness. Musculoskeletal: No lower extremity tenderness nor edema.  No joint effusions. Neurologic:  Normal speech and language. No gross focal neurologic deficits are appreciated. No gait instability. Skin:  Skin is warm, dry and intact. No rash noted. Psychiatric: Mood and affect are normal. Speech and behavior are normal.  ____________________________________________   LABS (all labs ordered are listed, but only abnormal results are displayed)  Labs Reviewed - No data to display ____________________________________________  EKG   ____________________________________________  RADIOLOGY   ____________________________________________   PROCEDURES  Procedure(s) performed: None  Procedures  Critical Care performed: No  ____________________________________________   INITIAL IMPRESSION / ASSESSMENT AND PLAN / ED COURSE  Pertinent labs & imaging results that  were available during my care of the patient were reviewed by me and considered in my medical decision making (see chart for details).  Upper respiratory infection with cough. Patient given discharge care instructions. Patient given a prescription for Bromfed-DM. Patient given a work note. [facility].  Clinical Course      ____________________________________________   FINAL CLINICAL IMPRESSION(S) / ED DIAGNOSES  Final diagnoses:  Viral URI with cough      NEW MEDICATIONS STARTED DURING THIS VISIT:  New Prescriptions   BROMPHENIRAMINE-PSEUDOEPHEDRINE-DM 30-2-10 MG/5ML SYRUP    Take 5 mLs by mouth 4 (four) times daily as needed.     Note:  This document was prepared using Dragon voice recognition software and may include unintentional dictation errors.    Joni Reining, PA-C 12/23/16 1413    Phineas Semen, MD 12/23/16 1426

## 2016-12-23 NOTE — ED Triage Notes (Signed)
Cough x 3 days

## 2016-12-25 ENCOUNTER — Ambulatory Visit
Admission: EM | Admit: 2016-12-25 | Discharge: 2016-12-25 | Disposition: A | Discharge status: Home / Self Care | Payer: PRIVATE HEALTH INSURANCE | Attending: Family Medicine | Admitting: Family Medicine

## 2016-12-25 ENCOUNTER — Ambulatory Visit (INDEPENDENT_AMBULATORY_CARE_PROVIDER_SITE_OTHER): Payer: PRIVATE HEALTH INSURANCE

## 2016-12-25 DIAGNOSIS — J069 Acute upper respiratory infection, unspecified: Secondary | ICD-10-CM

## 2016-12-25 LAB — RAPID INFLUENZA A&B ANTIGENS
Influenza A (ARMC): NEGATIVE
Influenza B (ARMC): NEGATIVE

## 2016-12-25 MED ORDER — ALBUTEROL SULFATE HFA 108 (90 BASE) MCG/ACT IN AERS: 1.0000 | INHALATION_SPRAY | Freq: Four times a day (QID) | RESPIRATORY_TRACT | 0 refills | Status: DC | PRN

## 2016-12-25 MED ORDER — HYDROCOD POLST-CPM POLST ER 10-8 MG/5ML PO SUER: 5.0000 mL | Freq: Two times a day (BID) | ORAL | 0 refills | Status: DC

## 2016-12-25 MED ORDER — FLUTICASONE PROPIONATE 50 MCG/ACT NA SUSP: 2.0000 | Freq: Every day | NASAL | 0 refills | Status: DC

## 2016-12-25 NOTE — ED Provider Notes (Signed)
CSN: [id number]     Arrival date & time 12/25/16  1353 History   First MD Initiated Contact with Patient 12/25/16 1532     Chief Complaint  Patient presents with  . Cough  . Nasal Congestion   (Consider location/radiation/quality/duration/timing/severity/associated sxs/prior Treatment) HPI  This is a [age] year old female presents with cough and congestion for 3 days. She was seen in the emergency room at [facility] 2 days ago she received a cough syrup diagnosed with a viral URI. States that since  that she has felt worse. States is difficult to take in a deep breath and whenever she coughs she has pain in her mid chest and a feeling of tightness. She is afebrile today at 98.5 pulse 107 blood pressure 147/104 respirations 18 O2 sats on room air 96%.      History reviewed. No pertinent past medical history. History reviewed. No pertinent surgical history. Family History  Problem Relation Age of Onset  . Hypertension Father   . Heart attack Father    Social History  Substance Use Topics  . Smoking status: Never Smoker  . Smokeless tobacco: Never Used  . Alcohol use No   OB History    No data available     Review of Systems  Constitutional: Positive for activity change, chills and fatigue.  HENT: Positive for congestion, postnasal drip, rhinorrhea, sinus pain, sinus pressure and sore throat.   Respiratory: Positive for cough and shortness of breath. Negative for wheezing and stridor.   All other systems reviewed and are negative.   Allergies  Patient has no known allergies.  Home Medications   Prior to Admission medications   Medication Sig Start Date End Date Taking? Authorizing Provider  brompheniramine-pseudoephedrine-DM 30-2-10 MG/5ML syrup Take 5 mLs by mouth 4 (four) times daily as needed. 12/23/16  Yes Joni Reiningonald K Smith, PA-C  ibuprofen (ADVIL,MOTRIN) 600 MG tablet Take 1 tablet (600 mg total) by mouth every 8 (eight) hours as needed. 04/18/16  Yes Tommi Rumpshonda L Summers, PA-C   albuterol (PROVENTIL HFA;VENTOLIN HFA) 108 (90 Base) MCG/ACT inhaler Inhale 1-2 puffs into the lungs every 6 (six) hours as needed for wheezing or shortness of breath. 12/25/16   Lutricia FeilWilliam P Roemer, PA-C  chlorpheniramine-HYDROcodone (TUSSIONEX PENNKINETIC ER) 10-8 MG/5ML SUER Take 5 mLs by mouth 2 (two) times daily. 12/25/16   Lutricia FeilWilliam P Roemer, PA-C  fluticasone (FLONASE) 50 MCG/ACT nasal spray Place 2 sprays into both nostrils daily. 12/25/16   Lutricia FeilWilliam P Roemer, PA-C   Meds Ordered and Administered this Visit  Medications - No data to display  BP (!) 147/104 (BP Location: Left Arm)   Pulse (!) 107   Temp 98.5 F (36.9 C) (Oral)   Resp 18   Ht 6\' 3"  (1.905 m)   Wt 280 lb (127 kg)   LMP 12/01/2016 (Exact Date) Comment: denies preg  SpO2 96%   BMI 35.00 kg/m  No data found.   Physical Exam  Constitutional: She is oriented to person, place, and time. She appears well-developed and well-nourished. No distress.  HENT:  Head: Normocephalic and atraumatic.  Right Ear: External ear normal.  Left Ear: External ear normal.  Nose: Nose normal.  Mouth/Throat: Oropharynx is clear and moist.  Eyes: EOM are normal. Pupils are equal, round, and reactive to light.  Neck: Normal range of motion. Neck supple.  Pulmonary/Chest: Effort normal. No respiratory distress. She has no wheezes. She has rales.  The patient has non-tussive crackles in the right base  Musculoskeletal: Normal range of  motion.  Lymphadenopathy:    She has no cervical adenopathy.  Neurological: She is alert and oriented to person, place, and time.  Skin: Skin is warm and dry. She is not diaphoretic.  Psychiatric: She has a normal mood and affect. Her behavior is normal. Judgment and thought content normal.  Nursing note and vitals reviewed.   Urgent Care Course   Clinical Course     Procedures (including critical care time)  Labs Review Labs Reviewed  RAPID INFLUENZA A&B ANTIGENS [facility] ONLY)    Imaging Review Dg  Chest 2 View  Result Date: 12/25/2016 CLINICAL DATA:  Nonproductive cough and fatigue. EXAM: CHEST  2 VIEW COMPARISON:  01/24/2015. FINDINGS: The heart size and mediastinal contours are within normal limits. Both lungs are clear. The visualized skeletal structures are unremarkable. IMPRESSION: No active cardiopulmonary disease.  Improved aeration from priors. Electronically Signed   By: Elsie Stain M.D.   On: 12/25/2016 16:06     Visual Acuity Review  Right Eye Distance:   Left Eye Distance:   Bilateral Distance:    Right Eye Near:   Left Eye Near:    Bilateral Near:         MDM   1. Upper respiratory tract infection, unspecified type    Discharge Medication List as of 12/25/2016  4:38 PM    START taking these medications   Details  albuterol (PROVENTIL HFA;VENTOLIN HFA) 108 (90 Base) MCG/ACT inhaler Inhale 1-2 puffs into the lungs every 6 (six) hours as needed for wheezing or shortness of breath., Starting Mon 12/25/2016, Normal    chlorpheniramine-HYDROcodone (TUSSIONEX PENNKINETIC ER) 10-8 MG/5ML SUER Take 5 mLs by mouth 2 (two) times daily., Starting Mon 12/25/2016, Print    fluticasone (FLONASE) 50 MCG/ACT nasal spray Place 2 sprays into both nostrils daily., Starting Mon 12/25/2016, Normal      Plan: 1. Test/x-ray results and diagnosis reviewed with patient 2. rx as per orders; risks, benefits, potential side effects reviewed with patient 3. Recommend supportive treatment with Rest and fluids. She will discontinue her previous cough preparation and will start taking Tussionex to allow her to rest at nighttime since she states is keeping her up all night long. I will provide her with albuterol for her shortness of breath. If she is not improving she should follow-up with her primary care physician. 4. F/u prn if symptoms worsen or don't improve     Lutricia Feil, PA-C 12/25/16 1641

## 2016-12-25 NOTE — ED Triage Notes (Addendum)
Pt c/o cough and congestion for 3 days. She says it is hard for her to take a deep breath. She was seen in the ED on Saturday and they gave her cough syrup and dx her with URI. She says her chest is tight and congested. Her blood pressure was elevated in the ED and also today, her dad passed away at 5349 due to a heart attack and he had a history of HTN.

## 2017-04-12 ENCOUNTER — Emergency Department
Admission: EM | Admit: 2017-04-12 | Discharge: 2017-04-12 | Disposition: A | Discharge status: Home / Self Care | Payer: PRIVATE HEALTH INSURANCE | Attending: Emergency Medicine | Admitting: Emergency Medicine

## 2017-04-12 ENCOUNTER — Encounter: Payer: Self-pay | Admitting: Medical Oncology

## 2017-04-12 DIAGNOSIS — J020 Streptococcal pharyngitis: Secondary | ICD-10-CM | POA: Insufficient documentation

## 2017-04-12 MED ORDER — AMOXICILLIN 500 MG PO TABS: 500.0000 mg | ORAL_TABLET | Freq: Two times a day (BID) | ORAL | 0 refills | Status: AC

## 2017-04-12 NOTE — ED Notes (Signed)
Sore throat for 1 days   Afebrile on arrival  Increased pain with swallowing

## 2017-04-12 NOTE — ED Provider Notes (Signed)
[facility] Emergency Department Provider Note  ____________________________________________  Time seen: Approximately 4:17 PM  I have reviewed the triage vital signs and the nursing notes.   HISTORY  Chief Complaint Sore Throat    HPI Katie Gilbert is a 26 y.o. female presenting to the emergency department with pharyngitis, tonsillar exudate, anterior neck pain and chills for one day. Patient is tolerating fluids by mouth and her own secretions. Patient has also had headache. She denies rhinorrhea, congestion, myalgias and fatigue. Patient denies nonproductive cough. No alleviating measures have been undertaken.    History reviewed. No pertinent past medical history.  There are no active problems to display for this patient.   No past surgical history on file.  Prior to Admission medications   Medication Sig Start Date End Date Taking? Authorizing Provider  albuterol (PROVENTIL HFA;VENTOLIN HFA) 108 (90 Base) MCG/ACT inhaler Inhale 1-2 puffs into the lungs every 6 (six) hours as needed for wheezing or shortness of breath. 12/25/16   Lutricia Feil, PA-C  amoxicillin (AMOXIL) 500 MG tablet Take 1 tablet (500 mg total) by mouth 2 (two) times daily. 04/12/17 04/22/17  Orvil Feil, PA-C  brompheniramine-pseudoephedrine-DM 30-2-10 MG/5ML syrup Take 5 mLs by mouth 4 (four) times daily as needed. 12/23/16   Joni Reining, PA-C  chlorpheniramine-HYDROcodone [facility]) 10-8 MG/5ML SUER Take 5 mLs by mouth 2 (two) times daily. 12/25/16   Lutricia Feil, PA-C  fluticasone (FLONASE) 50 MCG/ACT nasal spray Place 2 sprays into both nostrils daily. 12/25/16   Lutricia Feil, PA-C  ibuprofen (ADVIL,MOTRIN) 600 MG tablet Take 1 tablet (600 mg total) by mouth every 8 (eight) hours as needed. 04/18/16   Tommi Rumps, PA-C    Allergies Patient has no known allergies.  Family History  Problem Relation Age of Onset  . Hypertension Father   . Heart  attack Father     Social History Social History  Substance Use Topics  . Smoking status: Never Smoker  . Smokeless tobacco: Never Used  . Alcohol use No    Review of Systems  Constitutional: No fever/chills Eyes: No visual changes. No discharge ENT: Patient has pharyngitis. Cardiovascular: no chest pain. Respiratory: no cough. No SOB. Gastrointestinal: No abdominal pain.  No nausea, no vomiting.  No diarrhea.  No constipation. Musculoskeletal: Negative for musculoskeletal pain. Skin: Negative for rash, abrasions, lacerations, ecchymosis. Neurological: Patient has headache, no focal weakness or numbness.  ____________________________________________   PHYSICAL EXAM:  VITAL SIGNS: ED Triage Vitals  Enc Vitals Group     BP 04/12/17 1514 (!) 149/98     Pulse Rate 04/12/17 1514 100     Resp 04/12/17 1514 18     Temp 04/12/17 1514 98.4 F (36.9 C)     Temp Source 04/12/17 1514 Oral     SpO2 04/12/17 1514 97 %     Weight 04/12/17 1515 280 lb (127 kg)     Height 04/12/17 1515  (1.905 m)     Head Circumference --      Peak Flow --      Pain Score 04/12/17 1514 8     Pain Loc --      Pain Edu? --      Excl. in GC? --      Constitutional: Alert and oriented. Well appearing and in no acute distress. Eyes: Conjunctivae are normal. PERRL. EOMI. Head: Atraumatic. ENT:      Ears: Tympanic membranes are pearly bilaterally.  Nose: No congestion/rhinnorhea.      Mouth/Throat: Mucous membranes are moist. Patient has an erythematous posterior pharynx with tonsillar exudate. No petechiae. Uvula is midline. Hematological/Lymphatic/Immunilogical: Patient has tender cervical lymphadenopathy Cardiovascular: Normal rate, regular rhythm. Normal S1 and S2.  Good peripheral circulation. Respiratory: Normal respiratory effort without tachypnea or retractions. Lungs CTAB. Good air entry to the bases with no decreased or absent breath sounds. Gastrointestinal: Bowel sounds 4  quadrants. Soft and nontender to palpation. No guarding or rigidity. No palpable masses. No distention. No CVA tenderness. Musculoskeletal: Full range of motion to all extremities. No gross deformities appreciated. Neurologic:  Normal speech and language. No gross focal neurologic deficits are appreciated.  Skin:  Skin is warm, dry and intact. No rash noted. Psychiatric: Mood and affect are normal. Speech and behavior are normal. Patient exhibits appropriate insight and judgement.   ____________________________________________   LABS (all labs ordered are listed, but only abnormal results are displayed)  Labs Reviewed - No data to display ____________________________________________  EKG   ____________________________________________  RADIOLOGY   No results found.  ____________________________________________    PROCEDURES  Procedure(s) performed:    Procedures    Medications - No data to display   ____________________________________________   INITIAL IMPRESSION / ASSESSMENT AND PLAN / ED COURSE  Pertinent labs & imaging results that were available during my care of the patient were reviewed by me and considered in my medical decision making (see chart for details).  Review of the [place] CSRS was performed in accordance of the [facility] prior to dispensing any controlled drugs.    Assessment and plan: Streptococcal pharyngitis Patient presents to the emergency department with pharyngitis, tonsillar exudate, chills and absence of cough. On physical exam, patient's posterior pharynx was erythematous with tonsillar exudate. Streptococcal pharyngitis is likely according to [facility] criteria. Patient was discharged with amoxicillin. Vital signs are reassuring at this time. All patient questions were answered. ____________________________________________  FINAL CLINICAL IMPRESSION(S) / ED DIAGNOSES  Final diagnoses:  Strep pharyngitis      NEW MEDICATIONS STARTED  DURING THIS VISIT:  New Prescriptions   AMOXICILLIN (AMOXIL) 500 MG TABLET    Take 1 tablet (500 mg total) by mouth 2 (two) times daily.        This chart was dictated using voice recognition software/Dragon. Despite best efforts to proofread, errors can occur which can change the meaning. Any change was purely unintentional.    Orvil Feil, PA-C 04/12/17 1633    Charlynne Pander, MD 04/12/17 [phone]

## 2017-04-12 NOTE — ED Triage Notes (Signed)
Sore throat that began today.

## 2017-09-24 ENCOUNTER — Emergency Department
Admission: EM | Admit: 2017-09-24 | Discharge: 2017-09-25 | Disposition: A | Discharge status: Home / Self Care | Payer: BLUE CROSS/BLUE SHIELD | Attending: Emergency Medicine | Admitting: Emergency Medicine

## 2017-09-24 ENCOUNTER — Encounter: Payer: Self-pay | Admitting: *Deleted

## 2017-09-24 ENCOUNTER — Emergency Department: Payer: BLUE CROSS/BLUE SHIELD

## 2017-09-24 DIAGNOSIS — L03115 Cellulitis of right lower limb: Secondary | ICD-10-CM | POA: Diagnosis not present

## 2017-09-24 DIAGNOSIS — R52 Pain, unspecified: Secondary | ICD-10-CM

## 2017-09-24 DIAGNOSIS — Z79899 Other long term (current) drug therapy: Secondary | ICD-10-CM | POA: Insufficient documentation

## 2017-09-24 DIAGNOSIS — M79604 Pain in right leg: Secondary | ICD-10-CM

## 2017-09-24 DIAGNOSIS — R222 Localized swelling, mass and lump, trunk: Secondary | ICD-10-CM

## 2017-09-24 MED ORDER — HYDROCODONE-ACETAMINOPHEN 5-325 MG PO TABS: 1.0000 | ORAL_TABLET | Freq: Four times a day (QID) | ORAL | 0 refills | Status: DC | PRN

## 2017-09-24 MED ORDER — CEPHALEXIN 500 MG PO CAPS: 500.0000 mg | ORAL_CAPSULE | Freq: Four times a day (QID) | ORAL | 0 refills | Status: AC

## 2017-09-24 MED ORDER — OXYCODONE-ACETAMINOPHEN 5-325 MG PO TABS
1.0000 | ORAL_TABLET | Freq: Once | ORAL | Status: AC
  Administered 2017-09-24: 1 via ORAL
  Filled 2017-09-24: qty 1

## 2017-09-24 NOTE — ED Notes (Signed)
Pt discharged home after verbalizing understanding of discharge instructions; nad noted. 

## 2017-09-24 NOTE — ED Notes (Signed)
Right thigh Swelling x3 days , no injury

## 2017-09-24 NOTE — ED Provider Notes (Signed)
[facility] Emergency Department Provider Note   ____________________________________________   I have reviewed the triage vital signs and the nursing notes.   HISTORY  Chief Complaint Leg Pain    HPI Katie Gilbert is a 26 y.o. female presents to the emergency department with right anterior thigh pain and swelling that began 3 days ago. Patient noted symptoms without traumatic injury that has significantly worsened during her workday. Patient noted difficulty with ambulation and sustained weightbearing. Patient denies any changes in medication, illnesses or medical history that involves coagulation problems, recent surgeries, recent travel, changes in activity or other risk factors that contribute to possible clotting issues. Patient reports she occasionally experiences swelling in her ankles however, swelling today in the right ankle is more significant. Patient denies fever, chills, headache, vision changes, chest pain, chest tightness, shortness of breath, abdominal pain, nausea and vomiting.  No past medical history on file.  There are no active problems to display for this patient.   No past surgical history on file.  Prior to Admission medications   Medication Sig Start Date End Date Taking? Authorizing Provider  albuterol (PROVENTIL HFA;VENTOLIN HFA) 108 (90 Base) MCG/ACT inhaler Inhale 1-2 puffs into the lungs every 6 (six) hours as needed for wheezing or shortness of breath. 12/25/16   Lutricia Feil, PA-C  brompheniramine-pseudoephedrine-DM 30-2-10 MG/5ML syrup Take 5 mLs by mouth 4 (four) times daily as needed. 12/23/16   Joni Reining, PA-C  cephALEXin (KEFLEX) 500 MG capsule Take 1 capsule (500 mg total) by mouth 4 (four) times daily. 09/24/17 10/01/17  Deaire Mcwhirter M, PA-C  chlorpheniramine-HYDROcodone (TUSSIONEX PENNKINETIC ER) 10-8 MG/5ML SUER Take 5 mLs by mouth 2 (two) times daily. 12/25/16   Lutricia Feil, PA-C  fluticasone (FLONASE)  50 MCG/ACT nasal spray Place 2 sprays into both nostrils daily. 12/25/16   Lutricia Feil, PA-C  HYDROcodone-acetaminophen (NORCO/VICODIN) 5-325 MG tablet Take 1 tablet by mouth every 6 (six) hours as needed for moderate pain. 09/24/17   Ryshawn Sanzone M, PA-C  ibuprofen (ADVIL,MOTRIN) 600 MG tablet Take 1 tablet (600 mg total) by mouth every 8 (eight) hours as needed. 04/18/16   Tommi Rumps, PA-C    Allergies Patient has no known allergies.  Family History  Problem Relation Age of Onset  . Hypertension Father   . Heart attack Father     Social History Social History  Substance Use Topics  . Smoking status: Never Smoker  . Smokeless tobacco: Never Used  . Alcohol use No    Review of Systems Constitutional: Negative for fever/chills Eyes: No visual changes. Cardiovascular: Denies chest pain. Respiratory: Denies cough. Denies shortness of breath. Gastrointestinal: No abdominal pain.  No nausea, vomiting, diarrhea. Musculoskeletal: Right anterior thigh pain and swelling. Right lower leg and ankle swelling. Skin: Negative for rash. Neurological: Negative for headaches. Able to ambulate. ____________________________________________   PHYSICAL EXAM:  VITAL SIGNS: ED Triage Vitals  Enc Vitals Group     BP 09/24/17 1521 (!) 139/107     Pulse Rate 09/24/17 1521 100     Resp 09/24/17 1521 20     Temp 09/24/17 1521 98.4 F (36.9 C)     Temp Source 09/24/17 1521 Oral     SpO2 09/24/17 1521 99 %     Weight 09/24/17 1522 280 lb (127 kg)     Height 09/24/17 1522  (1.905 m)     Head Circumference --      Peak Flow --  Pain Score 09/24/17 1521 10     Pain Loc --      Pain Edu? --      Excl. in GC? --     Constitutional: Alert and oriented. Well appearing and in no acute distress.  Eyes: Conjunctivae are normal. PERRL. EOMI  Head: Normocephalic and atraumatic.  Respiratory: Normal respiratory effort without tachypnea or retractions. Lungs CTAB. No  wheezes/rales/rhonchi.  Cardiovascular: Normal rate, regular rhythm. Normal distal pulses. Gastrointestinal: Bowel sounds 4 quadrants. Soft and nontender to palpation. Musculoskeletal: Right lower extremity range of motion and strength intact without limitation. Visible swelling and erythema with palpable tenderness noted over the anterior thigh. Visible swelling noted at right ankle Intact pedal pulses of the right lower extremity. No pitting edema. No discoloration of the right lower extremity. Neurologic: Normal speech and language.  Skin:  Skin is very warm, dry and intact. No rash noted. Right anterior thigh skin erythematous without induration.  Psychiatric: Mood and affect are normal. Speech and behavior are normal. Patient exhibits appropriate insight and judgement.  ____________________________________________   LABS (all labs ordered are listed, but only abnormal results are displayed)  Labs Reviewed - No data to display ____________________________________________  EKG none ____________________________________________  RADIOLOGY US Venous IMG Lower Unilateral Right IMPRESSION: No evidence of DVT within the right lower extremity. ____________________________________________   PROCEDURES  Procedure(s) performed: none    Critical Care performed: no ____________________________________________   INITIAL IMPRESSION / ASSESSMENT AND PLAN / ED COURSE  Pertinent labs & imaging results that were available during my care of the patient were reviewed by me and considered in my medical decision making (see chart for details).  Patient presents to emergency department with right anterior thigh pain and swelling for 3 days. History, physical exam findings and imaging are reassuring symptoms are likely cellulitis. US imaging did not indicate DVT within the right lower extremity. Skin tissue swelling and erythema likely associated with infection. Patient will be prescribed  course of cephalexin for antibiotic coverage and short course of Vicodin for severe pain management. Patient advised to follow up with PCP as needed or return to the emergency department if symptoms return or worsen. Patient informed of clinical course, understand medical decision-making process, and agree with plan.      If controlled substance prescribed during this visit, 12 month history viewed on the [place] prior to issuing an initial prescription for Schedule II or III opiod. ____________________________________________   FINAL CLINICAL IMPRESSION(S) / ED DIAGNOSES  Final diagnoses:  Swelling in chest  Pain  Right leg pain  Cellulitis of right lower extremity       NEW MEDICATIONS STARTED DURING THIS VISIT:  New Prescriptions   CEPHALEXIN (KEFLEX) 500 MG CAPSULE    Take 1 capsule (500 mg total) by mouth 4 (four) times daily.   HYDROCODONE-ACETAMINOPHEN (NORCO/VICODIN) 5-325 MG TABLET    Take 1 tablet by mouth every 6 (six) hours as needed for moderate pain.     Note:  This document was prepared using Dragon voice recognition software and may include unintentional dictation errors.    Clois Comber, PA-C 09/24/17 1839    Minna Antis, MD 09/25/17 [phone]

## 2017-09-24 NOTE — ED Notes (Signed)
Pt states that she feels that her R thigh is swollen and that she can barely walk.  Pt states that she stands on her feet for work for 10 hours a day.  Pt states that she cannot work due to the pain.  Pt denies injury.

## 2017-09-24 NOTE — ED Triage Notes (Signed)
Pt to triage via wheelchair. Pt has swelling to right tthigh.  No known injury.  Swelling noted.   Pt alert

## 2017-11-10 ENCOUNTER — Emergency Department
Admission: EM | Admit: 2017-11-10 | Discharge: 2017-11-10 | Disposition: A | Discharge status: Home / Self Care | Payer: BLUE CROSS/BLUE SHIELD | Attending: Emergency Medicine | Admitting: Emergency Medicine

## 2017-11-10 ENCOUNTER — Other Ambulatory Visit: Payer: Self-pay

## 2017-11-10 ENCOUNTER — Encounter: Payer: Self-pay | Admitting: Emergency Medicine

## 2017-11-10 DIAGNOSIS — R739 Hyperglycemia, unspecified: Secondary | ICD-10-CM | POA: Insufficient documentation

## 2017-11-10 DIAGNOSIS — A5600 Chlamydial infection of lower genitourinary tract, unspecified: Secondary | ICD-10-CM | POA: Insufficient documentation

## 2017-11-10 DIAGNOSIS — Z79899 Other long term (current) drug therapy: Secondary | ICD-10-CM | POA: Diagnosis not present

## 2017-11-10 DIAGNOSIS — A6000 Herpesviral infection of urogenital system, unspecified: Secondary | ICD-10-CM | POA: Diagnosis not present

## 2017-11-10 DIAGNOSIS — R102 Pelvic and perineal pain: Secondary | ICD-10-CM | POA: Diagnosis present

## 2017-11-10 DIAGNOSIS — A749 Chlamydial infection, unspecified: Secondary | ICD-10-CM

## 2017-11-10 LAB — CHLAMYDIA/NGC RT PCR (ARMC ONLY)
CHLAMYDIA TR: DETECTED — AB
N GONORRHOEAE: NOT DETECTED

## 2017-11-10 LAB — COMPREHENSIVE METABOLIC PANEL
ALT: 11 U/L — ABNORMAL LOW (ref 14–54)
AST: 13 U/L — ABNORMAL LOW (ref 15–41)
Albumin: 3.7 g/dL (ref 3.5–5.0)
Alkaline Phosphatase: 127 U/L — ABNORMAL HIGH (ref 38–126)
Anion gap: 12 (ref 5–15)
BUN: 11 mg/dL (ref 6–20)
CO2: 21 mmol/L — ABNORMAL LOW (ref 22–32)
Calcium: 8.9 mg/dL (ref 8.9–10.3)
Chloride: 96 mmol/L — ABNORMAL LOW (ref 101–111)
Creatinine, Ser: 0.58 mg/dL (ref 0.44–1.00)
GFR calc Af Amer: >60 mL/min (ref >60)
GFR calc non Af Amer: >60 mL/min (ref >60)
Glucose, Bld: 334 mg/dL — ABNORMAL HIGH (ref 65–99)
Potassium: 4.1 mmol/L (ref 3.5–5.1)
Sodium: 129 mmol/L — ABNORMAL LOW (ref 135–145)
Total Bilirubin: 0.8 mg/dL (ref 0.3–1.2)
Total Protein: 8.2 g/dL — ABNORMAL HIGH (ref 6.5–8.1)

## 2017-11-10 LAB — URINALYSIS, COMPLETE (UACMP) WITH MICROSCOPIC
Bilirubin Urine: NEGATIVE
Hgb urine dipstick: NEGATIVE
KETONES UR: 20 mg/dL — AB
Nitrite: POSITIVE — AB
PROTEIN: 30 mg/dL — AB
Specific Gravity, Urine: 1.027 (ref 1.005–1.030)
pH: 5 (ref 5.0–8.0)

## 2017-11-10 LAB — WET PREP, GENITAL
Sperm: NONE SEEN
Trich, Wet Prep: NONE SEEN
Yeast Wet Prep HPF POC: NONE SEEN

## 2017-11-10 LAB — CBC WITH DIFFERENTIAL/PLATELET
Basophils Absolute: 0 10*3/uL (ref 0–0.1)
Basophils Relative: 0 %
Eosinophils Absolute: 0 10*3/uL (ref 0–0.7)
Eosinophils Relative: 0 %
HCT: 37.9 % (ref 35.0–47.0)
Hemoglobin: 12.8 g/dL (ref 12.0–16.0)
Lymphocytes Relative: 12 %
Lymphs Abs: 1 10*3/uL (ref 1.0–3.6)
MCH: 27.1 pg (ref 26.0–34.0)
MCHC: 33.7 g/dL (ref 32.0–36.0)
MCV: 80.5 fL (ref 80.0–100.0)
Monocytes Absolute: 1 10*3/uL — ABNORMAL HIGH (ref 0.2–0.9)
Monocytes Relative: 12 %
Neutro Abs: 6.7 10*3/uL — ABNORMAL HIGH (ref 1.4–6.5)
Neutrophils Relative %: 76 %
Platelets: 277 10*3/uL (ref 150–440)
RBC: 4.71 MIL/uL (ref 3.80–5.20)
RDW: 14 % (ref 11.5–14.5)
WBC: 8.8 10*3/uL (ref 3.6–11.0)

## 2017-11-10 LAB — POCT PREGNANCY, URINE: PREG TEST UR: NEGATIVE

## 2017-11-10 MED ORDER — CEFTRIAXONE SODIUM 250 MG IJ SOLR: 250.0000 mg | Freq: Once | INTRAMUSCULAR | Status: DC

## 2017-11-10 MED ORDER — DEXTROSE 5 % IV SOLN
1.0000 g | INTRAVENOUS | Status: DC
  Administered 2017-11-10: 1 g via INTRAVENOUS

## 2017-11-10 MED ORDER — VALACYCLOVIR HCL 1 G PO TABS: 1000.0000 mg | ORAL_TABLET | Freq: Two times a day (BID) | ORAL | 0 refills | Status: AC

## 2017-11-10 MED ORDER — SODIUM CHLORIDE 0.9 % IV BOLUS (SEPSIS)
1000.0000 mL | Freq: Once | INTRAVENOUS | Status: AC
  Administered 2017-11-10: 1000 mL via INTRAVENOUS

## 2017-11-10 MED ORDER — AZITHROMYCIN 500 MG PO TABS
1000.0000 mg | ORAL_TABLET | Freq: Once | ORAL | Status: AC
  Administered 2017-11-10: 1000 mg via ORAL
  Filled 2017-11-10: qty 2

## 2017-11-10 NOTE — ED Triage Notes (Signed)
Pt reports that she is having vaginal pain for the last two days. She denies any injury or discharge. She states that it hurts so bad that she cant have a bowel movement. She cant even sit up straight because of the pain.

## 2017-11-10 NOTE — ED Notes (Signed)
Received call from lab that pt's urine chlamydia is positive, gonorrhea negative. Lab asking whether cervical swab also needs to be performed. PA states cervical swab may be discontinued.

## 2017-11-10 NOTE — ED Provider Notes (Signed)
[facility] Emergency Department Provider Note  ____________________________________________  Time seen: Approximately 9:05 PM  I have reviewed the triage vital signs and the nursing notes.   HISTORY  Chief Complaint Vaginal Pain    HPI Katie Gilbert is a 26 y.o. female presents to the emergency department with 10 out of 10 vaginal pain for the past 2 days.  Patient reports that 2 days ago, she noticed vaginal irritation and then, white vaginal discharge.  Patient began applying Monistat.  Patient reports that pain quickly progressed in intensity.  Patient reports dysuria and increased urinary frequency but no hematuria.  She reports no dyspareunia.  Patient has had no prior surgeries.  She denies fever or chills.  No nausea or vomiting.  No abdominal pain.  Patient is in a monogamous relationship with one man for the past 5 years.  She denies a history of diabetes.    History reviewed. No pertinent past medical history.  There are no active problems to display for this patient.   History reviewed. No pertinent surgical history.  Prior to Admission medications   Medication Sig Start Date End Date Taking? Authorizing Provider  albuterol (PROVENTIL HFA;VENTOLIN HFA) 108 (90 Base) MCG/ACT inhaler Inhale 1-2 puffs into the lungs every 6 (six) hours as needed for wheezing or shortness of breath. 12/25/16   Lutricia Feiloemer, William P, PA-C  brompheniramine-pseudoephedrine-DM 30-2-10 MG/5ML syrup Take 5 mLs by mouth 4 (four) times daily as needed. 12/23/16   Joni ReiningSmith, Ronald K, PA-C  chlorpheniramine-HYDROcodone (TUSSIONEX PENNKINETIC ER) 10-8 MG/5ML SUER Take 5 mLs by mouth 2 (two) times daily. 12/25/16   Lutricia Feiloemer, William P, PA-C  fluticasone (FLONASE) 50 MCG/ACT nasal spray Place 2 sprays into both nostrils daily. 12/25/16   Lutricia Feiloemer, William P, PA-C  HYDROcodone-acetaminophen (NORCO/VICODIN) 5-325 MG tablet Take 1 tablet by mouth every 6 (six) hours as needed for moderate pain. 09/24/17    Little, Traci M, PA-C  ibuprofen (ADVIL,MOTRIN) 600 MG tablet Take 1 tablet (600 mg total) by mouth every 8 (eight) hours as needed. 04/18/16   Tommi RumpsSummers, Rhonda L, PA-C  valACYclovir (VALTREX) 1000 MG tablet Take 1 tablet (1,000 mg total) by mouth 2 (two) times daily for 10 days. 11/10/17 11/20/17  Orvil FeilWoods, Yves Fodor M, PA-C    Allergies Patient has no known allergies.  Family History  Problem Relation Age of Onset  . Hypertension Father   . Heart attack Father     Social History Social History   Tobacco Use  . Smoking status: Never Smoker  . Smokeless tobacco: Never Used  Substance Use Topics  . Alcohol use: No  . Drug use: No     Review of Systems  Constitutional: No fever/chills Eyes: No visual changes. No discharge ENT: No upper respiratory complaints. Cardiovascular: no chest pain. Respiratory: no cough. No SOB. Gastrointestinal: No abdominal pain.  No nausea, no vomiting.  No diarrhea.  No constipation. Genitourinary: Patient has vaginal pain. Musculoskeletal: Negative for musculoskeletal pain. Skin: Negative for rash, abrasions, lacerations, ecchymosis. Neurological: Negative for headaches, focal weakness or numbness.   ____________________________________________   PHYSICAL EXAM:  VITAL SIGNS: ED Triage Vitals  Enc Vitals Group     BP 11/10/17 1746 (!) 152/88     Pulse Rate 11/10/17 1746 (!) 116     Resp --      Temp 11/10/17 1746 98.5 F (36.9 C)     Temp Source 11/10/17 1746 Oral     SpO2 11/10/17 1746 99 %     Weight  11/10/17 1746 208 lb (94.3 kg)     Height 11/10/17 1746 6\' 3"  (1.905 m)     Head Circumference --      Peak Flow --      Pain Score 11/10/17 1755 10     Pain Loc --      Pain Edu? --      Excl. in GC? --      Constitutional: Alert and oriented. Well appearing and in no acute distress. Eyes: Conjunctivae are normal. PERRL. EOMI. Head: Atraumatic. Cardiovascular: Normal rate, regular rhythm. Normal S1 and S2.  Good peripheral  circulation. Respiratory: Normal respiratory effort without tachypnea or retractions. Lungs CTAB. Good air entry to the bases with no decreased or absent breath sounds. Gastrointestinal: Bowel sounds 4 quadrants. Soft and nontender to palpation. No guarding or rigidity. No palpable masses. No distention. No CVA tenderness. Genitourinary: Patient has diffuse ulcerations along the labia majora and at the entrance of the vagina.  Vaginal skin is erythematous.  No cervical motion tenderness was elicited with pelvic examination.  No palpable induration or fluctuance was appreciated at the entrance of the vagina. Musculoskeletal: Full range of motion to all extremities. No gross deformities appreciated. Neurologic:  Normal speech and language. No gross focal neurologic deficits are appreciated.  Skin:  Skin is warm, dry and intact. No rash noted. Psychiatric: Mood and affect are normal. Speech and behavior are normal. Patient exhibits appropriate insight and judgement.   ____________________________________________   LABS (all labs ordered are listed, but only abnormal results are displayed)  Labs Reviewed  CHLAMYDIA/NGC RT PCR ([facility] ONLY) - Abnormal; Notable for the following components:      Result Value   Chlamydia Tr DETECTED (*)    All other components within normal limits  WET PREP, GENITAL - Abnormal; Notable for the following components:   Clue Cells Wet Prep HPF POC PRESENT (*)    WBC, Wet Prep HPF POC MANY (*)    All other components within normal limits  URINALYSIS, COMPLETE (UACMP) WITH MICROSCOPIC - Abnormal; Notable for the following components:   Color, Urine YELLOW (*)    APPearance CLOUDY (*)    Glucose, UA >=500 (*)    Ketones, ur 20 (*)    Protein, ur 30 (*)    Nitrite POSITIVE (*)    Leukocytes, UA LARGE (*)    Bacteria, UA RARE (*)    Squamous Epithelial / LPF 0-5 (*)    All other components within normal limits  CBC WITH DIFFERENTIAL/PLATELET - Abnormal;  Notable for the following components:   Neutro Abs 6.7 (*)    Monocytes Absolute 1.0 (*)    All other components within normal limits  COMPREHENSIVE METABOLIC PANEL - Abnormal; Notable for the following components:   Sodium 129 (*)    Chloride 96 (*)    CO2 21 (*)    Glucose, Bld 334 (*)    Total Protein 8.2 (*)    AST 13 (*)    ALT 11 (*)    Alkaline Phosphatase 127 (*)    All other components within normal limits  HSV CULTURE AND TYPING  POC URINE PREG, ED  POCT PREGNANCY, URINE   ____________________________________________  EKG   ____________________________________________  RADIOLOGY   No results found.  ____________________________________________    PROCEDURES  Procedure(s) performed:    Procedures    Medications  cefTRIAXone (ROCEPHIN) 1 g in dextrose 5 % 50 mL IVPB (0 g Intravenous Stopped 11/10/17 2238)  azithromycin (ZITHROMAX) tablet 1,000  mg (1,000 mg Oral Given 11/10/17 2204)  sodium chloride 0.9 % bolus 1,000 mL (0 mLs Intravenous Stopped 11/10/17 2322)     ____________________________________________   INITIAL IMPRESSION / ASSESSMENT AND PLAN / ED COURSE  Pertinent labs & imaging results that were available during my care of the patient were reviewed by me and considered in my medical decision making (see chart for details).  Review of the [place] CSRS was performed in accordance of the [facility] prior to dispensing any controlled drugs.     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  Patient tested positive for chlamydia in the emergency department.  As patient has physical exam findings consistent with both genital herpes and lymphogranuloma venereum, patient was treated empirically with azithromycin and Valtrex.  Patient was also treated empirically for gonorrhea with Rocephin.  Patient was given a bolus of fluids after hyperglycemia was  identified on CMP.  Patient was advised to seek care with her primary care provider to manage diabetes.  Patient was also advised to seek care at local health department for a full STD panel.  Vital signs are reassuring prior to discharge.  All patient questions were answered.     ____________________________________________  FINAL CLINICAL IMPRESSION(S) / ED DIAGNOSES  Final diagnoses:  Chlamydia  Hyperglycemia  Genital herpes simplex, unspecified site      NEW MEDICATIONS STARTED DURING THIS VISIT:  ED Discharge Orders        Ordered    valACYclovir (VALTREX) 1000 MG tablet  2 times daily     11/10/17 2226          This chart was dictated using voice recognition software/Dragon. Despite best efforts to proofread, errors can occur which can change the meaning. Any change was purely unintentional.    Orvil FeilWoods, Rusti Arizmendi M, PA-C 11/11/17 Marlyce [id number]    Phineas SemenGoodman, Graydon, MD 11/13/17 [phone]

## 2017-11-13 LAB — HSV CULTURE AND TYPING

## 2017-11-14 ENCOUNTER — Telehealth: Payer: Self-pay | Admitting: Emergency Medicine

## 2017-11-14 NOTE — Telephone Encounter (Signed)
Called patient to give hsv test result.  Gave result.  Advised to continue the antiviral medication and follow up with pcp to discuss treatment for future outbreaks.

## 2018-01-20 ENCOUNTER — Other Ambulatory Visit: Payer: Self-pay

## 2018-01-20 ENCOUNTER — Emergency Department
Admission: EM | Admit: 2018-01-20 | Discharge: 2018-01-20 | Disposition: A | Discharge status: Home / Self Care | Payer: BLUE CROSS/BLUE SHIELD | Attending: Emergency Medicine | Admitting: Emergency Medicine

## 2018-01-20 ENCOUNTER — Encounter: Payer: Self-pay | Admitting: Emergency Medicine

## 2018-01-20 DIAGNOSIS — Z79899 Other long term (current) drug therapy: Secondary | ICD-10-CM | POA: Diagnosis not present

## 2018-01-20 DIAGNOSIS — H1033 Unspecified acute conjunctivitis, bilateral: Secondary | ICD-10-CM | POA: Insufficient documentation

## 2018-01-20 DIAGNOSIS — H5789 Other specified disorders of eye and adnexa: Secondary | ICD-10-CM | POA: Diagnosis present

## 2018-01-20 MED ORDER — SULFACETAMIDE SODIUM 10 % OP SOLN: 2.0000 [drp] | OPHTHALMIC | 1 refills | Status: AC

## 2018-01-20 NOTE — ED Provider Notes (Signed)
[facility] Emergency Department Provider Note ____________________________________________  Time seen: Approximately 2:10 PM  I have reviewed the triage vital signs and the nursing notes.   HISTORY  Chief Complaint Eye Problem   HPI Katie Gilbert is a 27 y.o. female who presents to the emergency department for evaluation and treatment of bilateral eye itching, irritation, erythema, and tenderness.  Symptoms started on the left 2 days ago and is now starting to itch on the right.  She denies any vision changes.  She does not wear contact lenses.  No known exposure to conjunctivitis.  She denies fever.  No alleviating measures have been attempted for this complaint prior to arrival.  History reviewed. No pertinent past medical history.  There are no active problems to display for this patient.   History reviewed. No pertinent surgical history.  Prior to Admission medications   Medication Sig Start Date End Date Taking? Authorizing Provider  albuterol (PROVENTIL HFA;VENTOLIN HFA) 108 (90 Base) MCG/ACT inhaler Inhale 1-2 puffs into the lungs every 6 (six) hours as needed for wheezing or shortness of breath. 12/25/16   Lutricia Feil, PA-C  brompheniramine-pseudoephedrine-DM 30-2-10 MG/5ML syrup Take 5 mLs by mouth 4 (four) times daily as needed. 12/23/16   Joni Reining, PA-C  chlorpheniramine-HYDROcodone (TUSSIONEX PENNKINETIC ER) 10-8 MG/5ML SUER Take 5 mLs by mouth 2 (two) times daily. 12/25/16   Lutricia Feil, PA-C  fluticasone (FLONASE) 50 MCG/ACT nasal spray Place 2 sprays into both nostrils daily. 12/25/16   Lutricia Feil, PA-C  HYDROcodone-acetaminophen (NORCO/VICODIN) 5-325 MG tablet Take 1 tablet by mouth every 6 (six) hours as needed for moderate pain. 09/24/17   Little, Traci M, PA-C  ibuprofen (ADVIL,MOTRIN) 600 MG tablet Take 1 tablet (600 mg total) by mouth every 8 (eight) hours as needed. 04/18/16   Tommi Rumps, PA-C  sulfacetamide  (BLEPH-10) 10 % ophthalmic solution Place 2 drops into both eyes every 2 (two) hours while awake for 2 days. After 2 days, instill 2 drops in both eyes every 4 hours while awake for 5 additional days. 01/20/18 01/22/18  Chinita Pester, FNP    Allergies Patient has no known allergies.  Family History  Problem Relation Age of Onset  . Hypertension Father   . Heart attack Father     Social History Social History   Tobacco Use  . Smoking status: Never Smoker  . Smokeless tobacco: Never Used  Substance Use Topics  . Alcohol use: No  . Drug use: No    Review of Systems   Constitutional: No fever/chills Eyes: Negative for visual changes.  Positive for pain. Musculoskeletal: Negative for pain. Skin: Negative for rash. Neurological: Positive for headaches, negative for focal weakness or numbness. Allergic: Negative for seasonal allergies. ____________________________________________  PHYSICAL EXAM:  VITAL SIGNS: ED Triage Vitals  Enc Vitals Group     BP 01/20/18 1322 133/90     Pulse Rate 01/20/18 1322 (!) 101     Resp 01/20/18 1322 20     Temp 01/20/18 1322 98.5 F (36.9 C)     Temp Source 01/20/18 1322 Oral     SpO2 01/20/18 1322 99 %     Weight 01/20/18 1312 270 lb (122.5 kg)     Height 01/20/18 1312 6' (1.829 m)     Head Circumference --      Peak Flow --      Pain Score --      Pain Loc --  Pain Edu? --      Excl. in GC? --     Constitutional: Alert and oriented. Well appearing and in no acute distress. Eyes: Visual acuity--see nursing documentation; no globe trauma; Eyelids normal to inspection; Sclera appears anicteric not.  Eyelids not inverted. Conjunctiva appears erythematous to the limbus, injected, with purulent exudate noted in the lower lid; Cornea nontraumatic on unstained visual exam. Head: Atraumatic. Nose: No congestion/rhinnorhea. Mouth/Throat: Mucous membranes are moist.  Oropharynx non-erythematous. Respiratory: Respirations even and  unlabored.  Cough not observed. Musculoskeletal:Normal ROM x 4 extremities. Neurologic:  Normal speech and language. No gross focal neurologic deficits are appreciated. Speech is normal. No gait instability. Skin:  Skin is warm, dry and intact. No rash noted. Psychiatric: Mood and affect are normal. Speech and behavior are normal.  ____________________________________________   LABS (all labs ordered are listed, but only abnormal results are displayed)  Labs Reviewed - No data to display ____________________________________________  EKG  Not indicated ____________________________________________  RADIOLOGY  Not indicated ____________________________________________   PROCEDURES  Procedure(s) performed: None ____________________________________________   INITIAL IMPRESSION / ASSESSMENT AND PLAN / ED COURSE  27 year old female presenting to the emergency department for evaluation and treatment of bilateral eye irritation.  Symptoms and exam are most consistent with a bacterial conjunctivitis for which she will be treated with Bleph-10.  Home care instructions were provided.  She is to follow-up with the ophthalmologist if not improving over the next 2-3 days.  She was encouraged to return to the emergency department for symptoms change worsen or she is unable to see her primary care provider or the ophthalmologist.  Pertinent labs & imaging results that were available during my care of the patient were reviewed by me and considered in my medical decision making (see chart for details). ____________________________________________   FINAL CLINICAL IMPRESSION(S) / ED DIAGNOSES  Final diagnoses:  Acute bacterial conjunctivitis of both eyes    Note:  This document was prepared using Dragon voice recognition software and may include unintentional dictation errors.    Chinita Pesterriplett, Nicolus Ose B, FNP 01/20/18 1629    Sharyn CreamerQuale, Mark, MD 01/20/18 2047

## 2018-01-20 NOTE — ED Triage Notes (Signed)
Presents with bilateral eye pain and swelling  Swelling worse to the left

## 2018-06-13 ENCOUNTER — Emergency Department
Admission: EM | Admit: 2018-06-13 | Discharge: 2018-06-14 | Disposition: A | Discharge status: Home / Self Care | Payer: BLUE CROSS/BLUE SHIELD | Attending: Emergency Medicine | Admitting: Emergency Medicine

## 2018-06-13 ENCOUNTER — Other Ambulatory Visit: Payer: Self-pay

## 2018-06-13 DIAGNOSIS — R739 Hyperglycemia, unspecified: Secondary | ICD-10-CM | POA: Insufficient documentation

## 2018-06-13 DIAGNOSIS — R079 Chest pain, unspecified: Secondary | ICD-10-CM | POA: Diagnosis present

## 2018-06-13 DIAGNOSIS — F410 Panic disorder [episodic paroxysmal anxiety] without agoraphobia: Secondary | ICD-10-CM

## 2018-06-13 DIAGNOSIS — F4329 Adjustment disorder with other symptoms: Secondary | ICD-10-CM | POA: Insufficient documentation

## 2018-06-13 DIAGNOSIS — F4321 Adjustment disorder with depressed mood: Secondary | ICD-10-CM

## 2018-06-13 HISTORY — DX: Herpesviral infection of urogenital system, unspecified: A60.00

## 2018-06-13 LAB — BLOOD GAS, VENOUS
Acid-base deficit: 1.1 mmol/L (ref 0.0–2.0)
Bicarbonate: 22.6 mmol/L (ref 20.0–28.0)
O2 SAT: 77.9 %
PH VEN: 7.43 (ref 7.250–7.430)
Patient temperature: 37
pCO2, Ven: 34 mmHg — ABNORMAL LOW (ref 44.0–60.0)
pO2, Ven: 41 mmHg (ref 32.0–45.0)

## 2018-06-13 LAB — CBC WITH DIFFERENTIAL/PLATELET
BASOS ABS: 0 10*3/uL (ref 0–0.1)
BASOS PCT: 1 %
EOS PCT: 1 %
Eosinophils Absolute: 0.1 10*3/uL (ref 0–0.7)
HCT: 37.6 % (ref 35.0–47.0)
Hemoglobin: 12.6 g/dL (ref 12.0–16.0)
Lymphocytes Relative: 19 %
Lymphs Abs: 1.6 10*3/uL (ref 1.0–3.6)
MCH: 28 pg (ref 26.0–34.0)
MCHC: 33.6 g/dL (ref 32.0–36.0)
MCV: 83.4 fL (ref 80.0–100.0)
MONO ABS: 0.5 10*3/uL (ref 0.2–0.9)
Monocytes Relative: 6 %
Neutro Abs: 6.2 10*3/uL (ref 1.4–6.5)
Neutrophils Relative %: 73 %
PLATELETS: 319 10*3/uL (ref 150–440)
RBC: 4.5 MIL/uL (ref 3.80–5.20)
RDW: 13.1 % (ref 11.5–14.5)
WBC: 8.4 10*3/uL (ref 3.6–11.0)

## 2018-06-13 MED ORDER — LORAZEPAM 2 MG/ML IJ SOLN
2.0000 mg | Freq: Once | INTRAMUSCULAR | Status: AC
  Administered 2018-06-13: 2 mg via INTRAVENOUS
  Filled 2018-06-13: qty 1

## 2018-06-13 MED ORDER — SODIUM CHLORIDE 0.9 % IV BOLUS
1000.0000 mL | Freq: Once | INTRAVENOUS | Status: AC
  Administered 2018-06-13: 1000 mL via INTRAVENOUS

## 2018-06-13 NOTE — ED Triage Notes (Addendum)
Pt arrives to ED via [facility] from her place of work. EMS [facility]. When starting PIV, EMS checked pt's blood glucose level and found it to be over 600. Pt has no history of DM, but does state a family history of same. Pt is A&O, but anxious and tearful; Dr Lamont Snowballifenbark at bedside upon pt's arrival to ED Rm 1.

## 2018-06-13 NOTE — ED Provider Notes (Signed)
[facility] Emergency Department Provider Note  ____________________________________________   First MD Initiated Contact with Patient 06/13/18 2320     (approximate)  I have reviewed the triage vital signs and the nursing notes.   HISTORY  Chief Complaint Shortness of Breath; Hyperglycemia; and Panic Attack   HPI Katie Gilbert is a 27 y.o. female who comes to the emergency department with chest pain shortness of breath.  The patient said that she was at work when she began to have a panic attack and began to hyperventilate.  She felt tingling in her arms.  Coworkers called 911.  Her symptoms came on rather suddenly and were severe.  Feels like previous  panic attack she is had.  Nothing seems to make them better and they are clearly worsened by interpersonal conflict.  EMS checked her blood sugar and noted it was greater than 600.  She has no known history of diabetes.  She does not take any steroids.   Past Medical History:  Diagnosis Date  . Genital herpes     There are no active problems to display for this patient.   History reviewed. No pertinent surgical history.  Prior to Admission medications   Medication Sig Start Date End Date Taking? Authorizing Provider  albuterol (PROVENTIL HFA;VENTOLIN HFA) 108 (90 Base) MCG/ACT inhaler Inhale 1-2 puffs into the lungs every 6 (six) hours as needed for wheezing or shortness of breath. 12/25/16   Lutricia Feil, PA-C  brompheniramine-pseudoephedrine-DM 30-2-10 MG/5ML syrup Take 5 mLs by mouth 4 (four) times daily as needed. 12/23/16   Joni Reining, PA-C  chlorpheniramine-HYDROcodone (TUSSIONEX PENNKINETIC ER) 10-8 MG/5ML SUER Take 5 mLs by mouth 2 (two) times daily. 12/25/16   Lutricia Feil, PA-C  fluticasone (FLONASE) 50 MCG/ACT nasal spray Place 2 sprays into both nostrils daily. 12/25/16   Lutricia Feil, PA-C  HYDROcodone-acetaminophen (NORCO/VICODIN) 5-325 MG tablet Take 1 tablet by mouth every 6  (six) hours as needed for moderate pain. 09/24/17   Little, Traci M, PA-C  ibuprofen (ADVIL,MOTRIN) 600 MG tablet Take 1 tablet (600 mg total) by mouth every 8 (eight) hours as needed. 04/18/16   Tommi Rumps, PA-C  LORazepam (ATIVAN) 1 MG tablet Take 1 tablet (1 mg total) by mouth every 8 (eight) hours as needed for anxiety. 06/14/18 06/14/19  Merrily Brittle, MD  metFORMIN (GLUCOPHAGE) 500 MG tablet Take 1 tablet (500 mg total) by mouth 2 (two) times daily with a meal. 06/14/18 06/14/19  Merrily Brittle, MD    Allergies Patient has no known allergies.  Family History  Problem Relation Age of Onset  . Hypertension Father   . Heart attack Father     Social History Social History   Tobacco Use  . Smoking status: Never Smoker  . Smokeless tobacco: Never Used  Substance Use Topics  . Alcohol use: No  . Drug use: No    Review of Systems Constitutional: No fever/chills Eyes: No visual changes. ENT: No sore throat. Cardiovascular: Positive for chest pain. Respiratory: Positive for shortness of breath. Gastrointestinal: No abdominal pain.  No nausea, no vomiting.  No diarrhea.  No constipation. Genitourinary: Negative for dysuria. Musculoskeletal: Negative for back pain. Skin: Negative for rash. Neurological: Negative for headaches, focal weakness or numbness.   ____________________________________________   PHYSICAL EXAM:  VITAL SIGNS: ED Triage Vitals [06/13/18 2315]  Enc Vitals Group     BP      Pulse      Resp  Temp      Temp src      SpO2 98 %     Weight      Height      Head Circumference      Peak Flow      Pain Score      Pain Loc      Pain Edu?      Excl. in GC?     Constitutional: Alert and oriented x4 somewhat anxious and tearful appearing nontoxic no diaphoresis speaks full clear sentences Eyes: PERRL EOMI. Head: Atraumatic. Nose: No congestion/rhinnorhea. Mouth/Throat: No trismus Neck: No stridor.   Cardiovascular: Normal rate, regular  rhythm. Grossly normal heart sounds.  Good peripheral circulation. Respiratory: Normal respiratory effort.  No retractions. Lungs CTAB and moving good air Gastrointestinal: Morbidly obese soft nontender Musculoskeletal: No lower extremity edema   Neurologic:  Normal speech and language. No gross focal neurologic deficits are appreciated. Skin:  Skin is warm, dry and intact. No rash noted. Psychiatric: Mildly anxious appearing  ____________________________________________   DIFFERENTIAL includes but not limited to  Diabetes, DKA, HHS, dehydration, panic attack ____________________________________________   LABS (all labs ordered are listed, but only abnormal results are displayed)  Labs Reviewed  BLOOD GAS, VENOUS - Abnormal; Notable for the following components:      Result Value   pCO2, Ven 34 (*)    All other components within normal limits  URINALYSIS, COMPLETE (UACMP) WITH MICROSCOPIC - Abnormal; Notable for the following components:   Color, Urine STRAW (*)    APPearance CLEAR (*)    Specific Gravity, Urine 1.035 (*)    Glucose, UA >=500 (*)    Leukocytes, UA SMALL (*)    Bacteria, UA RARE (*)    All other components within normal limits  COMPREHENSIVE METABOLIC PANEL - Abnormal; Notable for the following components:   Sodium 131 (*)    Glucose, Bld 534 (*)    Calcium 8.6 (*)    All other components within normal limits  HEMOGLOBIN A1C - Abnormal; Notable for the following components:   Hgb A1c MFr Bld 13.0 (*)    All other components within normal limits  CBC WITH DIFFERENTIAL/PLATELET  HCG, QUANTITATIVE, PREGNANCY  BETA-HYDROXYBUTYRIC ACID    Lab work reviewed by me with hyperglycemia but no evidence of DKA __________________________________________  EKG   ____________________________________________  RADIOLOGY   ____________________________________________   PROCEDURES  Procedure(s) performed: no  Procedures  Critical Care performed:  no  ____________________________________________   INITIAL IMPRESSION / ASSESSMENT AND PLAN / ED COURSE  Pertinent labs & imaging results that were available during my care of the patient were reviewed by me and considered in my medical decision making (see chart for details).   Regarding the patient's anxiety given Ativan with improvement in her symptoms.  Her elevated blood sugar certainly concerning so we will begin with IV fluids while labs are pending.  The patient's labs show hyperglycemia but no evidence of DKA or HHS.  Given a first dose of subcutaneous insulin and I will begin her on metformin.  She requires follow-up within 1 week for recheck.  I sent off an A1c now to help the primary care physician.  The patient is discharged home in stable condition verbalizes understanding agreement the plan.      ____________________________________________   FINAL CLINICAL IMPRESSION(S) / ED DIAGNOSES  Final diagnoses:  Hyperglycemia  Grief reaction  Panic attack      NEW MEDICATIONS STARTED DURING THIS VISIT:  Discharge Medication List  as of 06/14/2018 12:50 AM    START taking these medications   Details  LORazepam (ATIVAN) 1 MG tablet Take 1 tablet (1 mg total) by mouth every 8 (eight) hours as needed for anxiety., Starting Fri 06/14/2018, Until Sat 06/14/2019, Print    metFORMIN (GLUCOPHAGE) 500 MG tablet Take 1 tablet (500 mg total) by mouth 2 (two) times daily with a meal., Starting Fri 06/14/2018, Until Sat 06/14/2019, Print         Note:  This document was prepared using Dragon voice recognition software and may include unintentional dictation errors.     Merrily Brittleifenbark, Jahlen Bollman, MD 06/17/18 [phone]

## 2018-06-14 LAB — URINALYSIS, COMPLETE (UACMP) WITH MICROSCOPIC
BILIRUBIN URINE: NEGATIVE
HGB URINE DIPSTICK: NEGATIVE
Ketones, ur: NEGATIVE mg/dL
NITRITE: NEGATIVE
PH: 6 (ref 5.0–8.0)
Protein, ur: NEGATIVE mg/dL
SPECIFIC GRAVITY, URINE: 1.035 — AB (ref 1.005–1.030)

## 2018-06-14 LAB — COMPREHENSIVE METABOLIC PANEL
ALBUMIN: 3.6 g/dL (ref 3.5–5.0)
ALK PHOS: 120 U/L (ref 38–126)
ALT: 14 U/L (ref 0–44)
ANION GAP: 8 (ref 5–15)
AST: 21 U/L (ref 15–41)
BILIRUBIN TOTAL: 0.5 mg/dL (ref 0.3–1.2)
BUN: 6 mg/dL (ref 6–20)
CALCIUM: 8.6 mg/dL — AB (ref 8.9–10.3)
CO2: 22 mmol/L (ref 22–32)
Chloride: 101 mmol/L (ref 98–111)
Creatinine, Ser: 0.62 mg/dL (ref 0.44–1.00)
GFR calc Af Amer: >60 mL/min (ref >60)
GLUCOSE: 534 mg/dL — AB (ref 70–99)
Potassium: 3.6 mmol/L (ref 3.5–5.1)
Sodium: 131 mmol/L — ABNORMAL LOW (ref 135–145)
TOTAL PROTEIN: 7.4 g/dL (ref 6.5–8.1)

## 2018-06-14 LAB — HCG, QUANTITATIVE, PREGNANCY: hCG, Beta Chain, Quant, S: <1 m[IU]/mL (ref <5)

## 2018-06-14 LAB — HEMOGLOBIN A1C
Hgb A1c MFr Bld: 13 % — ABNORMAL HIGH (ref 4.8–5.6)
Mean Plasma Glucose: 326.4 mg/dL

## 2018-06-14 LAB — BETA-HYDROXYBUTYRIC ACID: Beta-Hydroxybutyric Acid: 0.1 mmol/L (ref 0.05–0.27)

## 2018-06-14 MED ORDER — METFORMIN HCL 500 MG PO TABS: 500.0000 mg | ORAL_TABLET | Freq: Two times a day (BID) | ORAL | 0 refills | Status: DC

## 2018-06-14 MED ORDER — METFORMIN HCL 500 MG PO TABS
500.0000 mg | ORAL_TABLET | Freq: Once | ORAL | Status: AC
  Administered 2018-06-14: 500 mg via ORAL
  Filled 2018-06-14: qty 1

## 2018-06-14 MED ORDER — INSULIN ASPART 100 UNIT/ML ~~LOC~~ SOLN
10.0000 [IU] | Freq: Once | SUBCUTANEOUS | Status: AC
  Administered 2018-06-14: 10 [IU] via SUBCUTANEOUS
  Filled 2018-06-14: qty 1

## 2018-06-14 MED ORDER — LORAZEPAM 1 MG PO TABS: 1.0000 mg | ORAL_TABLET | Freq: Three times a day (TID) | ORAL | 0 refills | Status: DC | PRN

## 2018-06-14 NOTE — Discharge Instructions (Signed)
It is possible that you have developed diabetes so please begin taking her metformin twice a day as prescribed and follow-up with your primary care physician within 1 week for reevaluation.  Return to the emergency department sooner for any concerns whatsoever.  It was a pleasure to take care of you today, and thank you for coming to our emergency department.  If you have any questions or concerns before leaving please ask the nurse to grab me and I'm more than happy to go through your aftercare instructions again.  If you were prescribed any opioid pain medication today such as Norco, Vicodin, Percocet, morphine, hydrocodone, or oxycodone please make sure you do not drive when you are taking this medication as it can alter your ability to drive safely.  If you have any concerns once you are home that you are not improving or are in fact getting worse before you can make it to your follow-up appointment, please do not hesitate to call 911 and come back for further evaluation.  Merrily BrittleNeil Delita Chiquito, MD  Results for orders placed or performed during the hospital encounter of 06/13/18  Blood gas, venous  Result Value Ref Range   pH, Ven 7.43 7.250 - 7.430   pCO2, Ven 34 (L) 44.0 - 60.0 mmHg   pO2, Ven 41.0 32.0 - 45.0 mmHg   Bicarbonate 22.6 20.0 - 28.0 mmol/L   Acid-base deficit 1.1 0.0 - 2.0 mmol/L   O2 Saturation 77.9 %   Patient temperature 37.0    Collection site VENOUS    Sample type VENOUS   Urinalysis, Complete w Microscopic  Result Value Ref Range   Color, Urine STRAW (A) YELLOW   APPearance CLEAR (A) CLEAR   Specific Gravity, Urine 1.035 (H) 1.005 - 1.030   pH 6.0 5.0 - 8.0   Glucose, UA >=500 (A) NEGATIVE mg/dL   Hgb urine dipstick NEGATIVE NEGATIVE   Bilirubin Urine NEGATIVE NEGATIVE   Ketones, ur NEGATIVE NEGATIVE mg/dL   Protein, ur NEGATIVE NEGATIVE mg/dL   Nitrite NEGATIVE NEGATIVE   Leukocytes, UA SMALL (A) NEGATIVE   RBC / HPF 0-5 0 - 5 RBC/hpf   WBC, UA 6-10 0 - 5  WBC/hpf   Bacteria, UA RARE (A) NONE SEEN   Squamous Epithelial / LPF 0-5 0 - 5   Mucus PRESENT   Comprehensive metabolic panel  Result Value Ref Range   Sodium 131 (L) 135 - 145 mmol/L   Potassium 3.6 3.5 - 5.1 mmol/L   Chloride 101 98 - 111 mmol/L   CO2 22 22 - 32 mmol/L   Glucose, Bld 534 (HH) 70 - 99 mg/dL   BUN 6 6 - 20 mg/dL   Creatinine, Ser 5.400.62 0.44 - 1.00 mg/dL   Calcium 8.6 (L) 8.9 - 10.3 mg/dL   Total Protein 7.4 6.5 - 8.1 g/dL   Albumin 3.6 3.5 - 5.0 g/dL   AST 21 15 - 41 U/L   ALT 14 0 - 44 U/L   Alkaline Phosphatase 120 38 - 126 U/L   Total Bilirubin 0.5 0.3 - 1.2 mg/dL   GFR calc non Af Amer >60 >60 mL/min   GFR calc Af Amer >60 >60 mL/min   Anion gap 8 5 - 15  CBC with Differential  Result Value Ref Range   WBC 8.4 3.6 - 11.0 K/uL   RBC 4.50 3.80 - 5.20 MIL/uL   Hemoglobin 12.6 12.0 - 16.0 g/dL   HCT 98.137.6 19.135.0 - 47.847.0 %   MCV 83.4  80.0 - 100.0 fL   MCH 28.0 26.0 - 34.0 pg   MCHC 33.6 32.0 - 36.0 g/dL   RDW 29.5 28.4 - 13.2 %   Platelets 319 150 - 440 K/uL   Neutrophils Relative % 73 %   Neutro Abs 6.2 1.4 - 6.5 K/uL   Lymphocytes Relative 19 %   Lymphs Abs 1.6 1.0 - 3.6 K/uL   Monocytes Relative 6 %   Monocytes Absolute 0.5 0.2 - 0.9 K/uL   Eosinophils Relative 1 %   Eosinophils Absolute 0.1 0 - 0.7 K/uL   Basophils Relative 1 %   Basophils Absolute 0.0 0 - 0.1 K/uL  hCG, quantitative, pregnancy  Result Value Ref Range   hCG, Beta Chain, Quant, S <1 <5 mIU/mL  Beta-hydroxybutyric acid  Result Value Ref Range   Beta-Hydroxybutyric Acid 0.10 0.05 - 0.27 mmol/L

## 2018-08-28 ENCOUNTER — Other Ambulatory Visit: Payer: Self-pay

## 2018-08-28 DIAGNOSIS — F419 Anxiety disorder, unspecified: Secondary | ICD-10-CM | POA: Diagnosis not present

## 2018-08-28 DIAGNOSIS — Z7984 Long term (current) use of oral hypoglycemic drugs: Secondary | ICD-10-CM | POA: Diagnosis not present

## 2018-08-28 DIAGNOSIS — Z79899 Other long term (current) drug therapy: Secondary | ICD-10-CM | POA: Insufficient documentation

## 2018-08-28 NOTE — ED Triage Notes (Addendum)
Pt arrives to ED via POV from home with c/o anxiety. Pt states she "just feels overwhelmed". Pt denies any recent stressful events, no recent additional stressors. Pt denies any thoughts of SI or HI. Pt reports being seen for same in the past, is not r/x'd any medications. Pt also c/o a "stress" headache x3 days and reports taking OTC medications without relief. No fever, no N/V/D. No c/o CP or SHOB.

## 2018-08-29 ENCOUNTER — Emergency Department
Admission: EM | Admit: 2018-08-29 | Discharge: 2018-08-29 | Disposition: A | Discharge status: Home / Self Care | Payer: BLUE CROSS/BLUE SHIELD | Attending: Emergency Medicine | Admitting: Emergency Medicine

## 2018-08-29 DIAGNOSIS — F419 Anxiety disorder, unspecified: Secondary | ICD-10-CM

## 2018-08-29 MED ORDER — LORAZEPAM 0.5 MG PO TABS
0.5000 mg | ORAL_TABLET | Freq: Once | ORAL | Status: AC
  Administered 2018-08-29: 0.5 mg via ORAL
  Filled 2018-08-29: qty 1

## 2018-08-29 MED ORDER — LORAZEPAM 2 MG/ML IJ SOLN: 0.5000 mg | Freq: Once | INTRAMUSCULAR | Status: DC

## 2018-08-29 NOTE — ED Provider Notes (Signed)
[facility] Emergency Department Provider Note    First MD Initiated Contact with Patient 08/28/18 2349     (approximate)  I have reviewed the triage vital signs and the nursing notes.   HISTORY  Chief Complaint Anxiety   HPI Katie Gilbert is a 27 y.o. female with the lowest of chronic medical conditions presents to the emergency department with feelings of being overwhelmed and anxious which began yesterday. Patient denies any inciting event. Patient denies any suicidal or homicidal ideation. Patient states that she has been dealing with "anxiety for a while. Patient denies ever receiving any treatment for anxiety.   Past Medical History:  Diagnosis Date  . Genital herpes     There are no active problems to display for this patient.   History reviewed. No pertinent surgical history.  Prior to Admission medications   Medication Sig Start Date End Date Taking? Authorizing Provider  albuterol (PROVENTIL HFA;VENTOLIN HFA) 108 (90 Base) MCG/ACT inhaler Inhale 1-2 puffs into the lungs every 6 (six) hours as needed for wheezing or shortness of breath. 12/25/16   Lutricia Feiloemer, William P, PA-C  brompheniramine-pseudoephedrine-DM 30-2-10 MG/5ML syrup Take 5 mLs by mouth 4 (four) times daily as needed. 12/23/16   Joni ReiningSmith, Ronald K, PA-C  chlorpheniramine-HYDROcodone (TUSSIONEX PENNKINETIC ER) 10-8 MG/5ML SUER Take 5 mLs by mouth 2 (two) times daily. 12/25/16   Lutricia Feiloemer, William P, PA-C  fluticasone (FLONASE) 50 MCG/ACT nasal spray Place 2 sprays into both nostrils daily. 12/25/16   Lutricia Feiloemer, William P, PA-C  HYDROcodone-acetaminophen (NORCO/VICODIN) 5-325 MG tablet Take 1 tablet by mouth every 6 (six) hours as needed for moderate pain. 09/24/17   Little, Traci M, PA-C  ibuprofen (ADVIL,MOTRIN) 600 MG tablet Take 1 tablet (600 mg total) by mouth every 8 (eight) hours as needed. 04/18/16   Tommi RumpsSummers, Rhonda L, PA-C  LORazepam (ATIVAN) 1 MG tablet Take 1 tablet (1 mg total) by mouth  every 8 (eight) hours as needed for anxiety. 06/14/18 06/14/19  Merrily Brittleifenbark, Neil, MD  metFORMIN (GLUCOPHAGE) 500 MG tablet Take 1 tablet (500 mg total) by mouth 2 (two) times daily with a meal. 06/14/18 06/14/19  Merrily Brittleifenbark, Neil, MD    Allergies no known drug allergies  Family History  Problem Relation Age of Onset  . Hypertension Father   . Heart attack Father     Social History Social History   Tobacco Use  . Smoking status: Never Smoker  . Smokeless tobacco: Never Used  Substance Use Topics  . Alcohol use: No  . Drug use: No    Review of Systems Constitutional: No fever/chills Eyes: No visual changes. ENT: No sore throat. Cardiovascular: Denies chest pain. Respiratory: Denies shortness of breath. Gastrointestinal: No abdominal pain.  No nausea, no vomiting.  No diarrhea.  No constipation. Genitourinary: Negative for dysuria. Musculoskeletal: Negative for neck pain.  Negative for back pain. Integumentary: Negative for rash. Neurological: Negative for headaches, focal weakness or numbness. Psychiatric:positive for anxiety  ____________________________________________   PHYSICAL EXAM:  VITAL SIGNS: ED Triage Vitals  Enc Vitals Group     BP 08/28/18 2250 (!) 156/102     Pulse Rate 08/28/18 2250 100     Resp 08/28/18 2250 18     Temp 08/28/18 2250 98.9 F (37.2 C)     Temp Source 08/28/18 2250 Oral     SpO2 08/28/18 2250 99 %     Weight 08/28/18 2251 122.5 kg (270 lb)     Height 08/28/18 2251 1.905 m (6\' 3" )  Head Circumference --      Peak Flow --      Pain Score 08/28/18 2251 9     Pain Loc --      Pain Edu? --      Excl. in GC? --     Constitutional: Alert and oriented. appears anxious Eyes: Conjunctivae are normal.  Head: Atraumatic. Mouth/Throat: Mucous membranes are moist. Neck: No stridor.  Cardiovascular: Normal rate, regular rhythm. Good peripheral circulation. Grossly normal heart sounds. Respiratory: Normal respiratory effort.  No  retractions. Lungs CTAB. Gastrointestinal: Soft and nontender. No distention.  Musculoskeletal: No lower extremity tenderness nor edema. No gross deformities of extremities. Neurologic:  Normal speech and language. No gross focal neurologic deficits are appreciated.  Skin:  Skin is warm, dry and intact. No rash noted. Psychiatric:anxious affect  ____________________________________________   LABS (all labs ordered are listed, but only abnormal results are displayed)  Labs Reviewed  TSH  BASIC METABOLIC PANEL      Procedures   ____________________________________________   INITIAL IMPRESSION / ASSESSMENT AND PLAN / ED COURSE  As part of my medical decision making, I reviewed the following data within the electronic MEDICAL RECORD NUMBER   27 year old female presenting to the emergency Department above stated history of physical exam secondary to feelings of anxiety. Patient wasn't anxious affect during evaluation. Patient given Ativan 0.5 mg by mouth. Spoke to patient at length regarding need for outpatient follow-up. TTS staff gave the patient outpatient resources. Again patient denied any suicidal or homicidal ideation. ____________________________________________  FINAL CLINICAL IMPRESSION(S) / ED DIAGNOSES  Final diagnoses:  Anxiety     MEDICATIONS GIVEN DURING THIS VISIT:  Medications  LORazepam (ATIVAN) injection 0.5 mg (has no administration in time range)     ED Discharge Orders    None       Note:  This document was prepared using Dragon voice recognition software and may include unintentional dictation errors.    Darci Current, MD 08/29/18 [phone]

## 2018-08-29 NOTE — ED Notes (Addendum)
Patient states she has been feeling overwhelmed and has a headache that is unrelieved by OTC meds

## 2019-01-16 ENCOUNTER — Other Ambulatory Visit: Payer: Self-pay

## 2019-01-16 ENCOUNTER — Inpatient Hospital Stay: Payer: No Typology Code available for payment source

## 2019-01-16 ENCOUNTER — Encounter: Payer: Self-pay | Admitting: Intensive Care

## 2019-01-16 ENCOUNTER — Inpatient Hospital Stay
Admission: EM | Admit: 2019-01-16 | Discharge: 2019-01-21 | DRG: 853 | Disposition: A | Discharge status: Home / Self Care | Payer: No Typology Code available for payment source | Attending: Internal Medicine | Admitting: Internal Medicine

## 2019-01-16 ENCOUNTER — Emergency Department: Payer: No Typology Code available for payment source

## 2019-01-16 DIAGNOSIS — L03031 Cellulitis of right toe: Secondary | ICD-10-CM | POA: Diagnosis not present

## 2019-01-16 DIAGNOSIS — A480 Gas gangrene: Secondary | ICD-10-CM | POA: Diagnosis present

## 2019-01-16 DIAGNOSIS — L089 Local infection of the skin and subcutaneous tissue, unspecified: Secondary | ICD-10-CM

## 2019-01-16 DIAGNOSIS — E11628 Type 2 diabetes mellitus with other skin complications: Secondary | ICD-10-CM | POA: Diagnosis present

## 2019-01-16 DIAGNOSIS — L84 Corns and callosities: Secondary | ICD-10-CM | POA: Diagnosis present

## 2019-01-16 DIAGNOSIS — A419 Sepsis, unspecified organism: Principal | ICD-10-CM | POA: Diagnosis present

## 2019-01-16 DIAGNOSIS — E1052 Type 1 diabetes mellitus with diabetic peripheral angiopathy with gangrene: Secondary | ICD-10-CM | POA: Diagnosis present

## 2019-01-16 DIAGNOSIS — L03115 Cellulitis of right lower limb: Secondary | ICD-10-CM | POA: Diagnosis present

## 2019-01-16 DIAGNOSIS — L02611 Cutaneous abscess of right foot: Secondary | ICD-10-CM | POA: Diagnosis present

## 2019-01-16 DIAGNOSIS — E1040 Type 1 diabetes mellitus with diabetic neuropathy, unspecified: Secondary | ICD-10-CM | POA: Diagnosis present

## 2019-01-16 DIAGNOSIS — R1110 Vomiting, unspecified: Secondary | ICD-10-CM

## 2019-01-16 DIAGNOSIS — L97509 Non-pressure chronic ulcer of other part of unspecified foot with unspecified severity: Secondary | ICD-10-CM | POA: Diagnosis present

## 2019-01-16 DIAGNOSIS — L97401 Non-pressure chronic ulcer of unspecified heel and midfoot limited to breakdown of skin: Secondary | ICD-10-CM

## 2019-01-16 DIAGNOSIS — E10628 Type 1 diabetes mellitus with other skin complications: Secondary | ICD-10-CM | POA: Diagnosis not present

## 2019-01-16 DIAGNOSIS — Z833 Family history of diabetes mellitus: Secondary | ICD-10-CM

## 2019-01-16 DIAGNOSIS — B951 Streptococcus, group B, as the cause of diseases classified elsewhere: Secondary | ICD-10-CM | POA: Diagnosis not present

## 2019-01-16 DIAGNOSIS — E08621 Diabetes mellitus due to underlying condition with foot ulcer: Secondary | ICD-10-CM

## 2019-01-16 DIAGNOSIS — L819 Disorder of pigmentation, unspecified: Secondary | ICD-10-CM | POA: Diagnosis present

## 2019-01-16 DIAGNOSIS — T383X6A Underdosing of insulin and oral hypoglycemic [antidiabetic] drugs, initial encounter: Secondary | ICD-10-CM | POA: Diagnosis present

## 2019-01-16 DIAGNOSIS — Z9114 Patient's other noncompliance with medication regimen: Secondary | ICD-10-CM | POA: Diagnosis not present

## 2019-01-16 DIAGNOSIS — Z7951 Long term (current) use of inhaled steroids: Secondary | ICD-10-CM

## 2019-01-16 DIAGNOSIS — Z79899 Other long term (current) drug therapy: Secondary | ICD-10-CM

## 2019-01-16 DIAGNOSIS — Z7984 Long term (current) use of oral hypoglycemic drugs: Secondary | ICD-10-CM | POA: Diagnosis not present

## 2019-01-16 DIAGNOSIS — E10621 Type 1 diabetes mellitus with foot ulcer: Secondary | ICD-10-CM | POA: Diagnosis present

## 2019-01-16 DIAGNOSIS — M609 Myositis, unspecified: Secondary | ICD-10-CM | POA: Diagnosis present

## 2019-01-16 DIAGNOSIS — E1069 Type 1 diabetes mellitus with other specified complication: Secondary | ICD-10-CM | POA: Diagnosis present

## 2019-01-16 DIAGNOSIS — Y92009 Unspecified place in unspecified non-institutional (private) residence as the place of occurrence of the external cause: Secondary | ICD-10-CM

## 2019-01-16 DIAGNOSIS — E11621 Type 2 diabetes mellitus with foot ulcer: Secondary | ICD-10-CM | POA: Diagnosis present

## 2019-01-16 DIAGNOSIS — Z89421 Acquired absence of other right toe(s): Secondary | ICD-10-CM | POA: Diagnosis not present

## 2019-01-16 DIAGNOSIS — M869 Osteomyelitis, unspecified: Secondary | ICD-10-CM | POA: Diagnosis present

## 2019-01-16 DIAGNOSIS — M009 Pyogenic arthritis, unspecified: Secondary | ICD-10-CM | POA: Diagnosis not present

## 2019-01-16 LAB — CBC WITH DIFFERENTIAL/PLATELET
Abs Immature Granulocytes: 0.11 10*3/uL — ABNORMAL HIGH (ref 0.00–0.07)
BASOS ABS: 0.1 10*3/uL (ref 0.0–0.1)
Basophils Relative: 0 %
EOS ABS: 0.1 10*3/uL (ref 0.0–0.5)
Eosinophils Relative: 0 %
HCT: 33.7 % — ABNORMAL LOW (ref 36.0–46.0)
Hemoglobin: 11.2 g/dL — ABNORMAL LOW (ref 12.0–15.0)
IMMATURE GRANULOCYTES: 1 %
Lymphocytes Relative: 8 %
Lymphs Abs: 1.3 10*3/uL (ref 0.7–4.0)
MCH: 27.3 pg (ref 26.0–34.0)
MCHC: 33.2 g/dL (ref 30.0–36.0)
MCV: 82 fL (ref 80.0–100.0)
Monocytes Absolute: 1.1 10*3/uL — ABNORMAL HIGH (ref 0.1–1.0)
Monocytes Relative: 6 %
NEUTROS PCT: 85 %
NRBC: 0 % (ref 0.0–0.2)
Neutro Abs: 14.3 10*3/uL — ABNORMAL HIGH (ref 1.7–7.7)
Platelets: 398 10*3/uL (ref 150–400)
RBC: 4.11 MIL/uL (ref 3.87–5.11)
RDW: 11.6 % (ref 11.5–15.5)
WBC: 16.9 10*3/uL — ABNORMAL HIGH (ref 4.0–10.5)

## 2019-01-16 LAB — HEMOGLOBIN A1C
Hgb A1c MFr Bld: 12.3 % — ABNORMAL HIGH (ref 4.8–5.6)
Mean Plasma Glucose: 306.31 mg/dL

## 2019-01-16 LAB — URINALYSIS, COMPLETE (UACMP) WITH MICROSCOPIC
Bilirubin Urine: NEGATIVE
KETONES UR: 80 mg/dL — AB
Nitrite: POSITIVE — AB
PH: 5 (ref 5.0–8.0)
Protein, ur: 30 mg/dL — AB
Specific Gravity, Urine: 1.036 — ABNORMAL HIGH (ref 1.005–1.030)

## 2019-01-16 LAB — COMPREHENSIVE METABOLIC PANEL
ALK PHOS: 110 U/L (ref 38–126)
ALT: 11 U/L (ref 0–44)
AST: 12 U/L — AB (ref 15–41)
Albumin: 3.2 g/dL — ABNORMAL LOW (ref 3.5–5.0)
Anion gap: 12 (ref 5–15)
BUN: 8 mg/dL (ref 6–20)
CALCIUM: 8.6 mg/dL — AB (ref 8.9–10.3)
CHLORIDE: 95 mmol/L — AB (ref 98–111)
CO2: 22 mmol/L (ref 22–32)
Creatinine, Ser: 0.6 mg/dL (ref 0.44–1.00)
GFR calc Af Amer: >60 mL/min (ref >60)
GFR calc non Af Amer: >60 mL/min (ref >60)
Glucose, Bld: 355 mg/dL — ABNORMAL HIGH (ref 70–99)
Potassium: 3.6 mmol/L (ref 3.5–5.1)
Sodium: 129 mmol/L — ABNORMAL LOW (ref 135–145)
Total Bilirubin: 0.9 mg/dL (ref 0.3–1.2)
Total Protein: 8.1 g/dL (ref 6.5–8.1)

## 2019-01-16 LAB — CK: Total CK: 46 U/L (ref 38–234)

## 2019-01-16 LAB — GLUCOSE, CAPILLARY
Glucose-Capillary: 285 mg/dL — ABNORMAL HIGH (ref 70–99)
Glucose-Capillary: 276 mg/dL — ABNORMAL HIGH (ref 70–99)

## 2019-01-16 LAB — LACTIC ACID, PLASMA
Lactic Acid, Venous: 1.4 mmol/L (ref 0.5–1.9)
Lactic Acid, Venous: 1.1 mmol/L (ref 0.5–1.9)

## 2019-01-16 MED ORDER — ONDANSETRON HCL 4 MG/2ML IJ SOLN
4.0000 mg | Freq: Four times a day (QID) | INTRAMUSCULAR | Status: DC | PRN
  Administered 2019-01-19 – 2019-01-20 (×2): 4 mg via INTRAVENOUS
  Filled 2019-01-16 (×2): qty 2

## 2019-01-16 MED ORDER — VANCOMYCIN HCL 10 G IV SOLR
1500.0000 mg | Freq: Once | INTRAVENOUS | Status: AC
  Administered 2019-01-16: 1500 mg via INTRAVENOUS
  Filled 2019-01-16: qty 1500

## 2019-01-16 MED ORDER — VANCOMYCIN HCL IN DEXTROSE 1-5 GM/200ML-% IV SOLN
1000.0000 mg | Freq: Once | INTRAVENOUS | Status: AC
  Administered 2019-01-16: 1000 mg via INTRAVENOUS
  Filled 2019-01-16: qty 200

## 2019-01-16 MED ORDER — ONDANSETRON HCL 4 MG PO TABS: 4.0000 mg | ORAL_TABLET | Freq: Four times a day (QID) | ORAL | Status: DC | PRN

## 2019-01-16 MED ORDER — ACETAMINOPHEN 325 MG PO TABS
650.0000 mg | ORAL_TABLET | Freq: Four times a day (QID) | ORAL | Status: DC | PRN
  Administered 2019-01-16 – 2019-01-18 (×3): 650 mg via ORAL
  Filled 2019-01-16 (×3): qty 2

## 2019-01-16 MED ORDER — MORPHINE SULFATE (PF) 2 MG/ML IV SOLN
2.0000 mg | INTRAVENOUS | Status: DC | PRN
  Administered 2019-01-16 – 2019-01-17 (×3): 2 mg via INTRAVENOUS
  Filled 2019-01-16 (×3): qty 1

## 2019-01-16 MED ORDER — INSULIN ASPART 100 UNIT/ML ~~LOC~~ SOLN
0.0000 [IU] | Freq: Three times a day (TID) | SUBCUTANEOUS | Status: DC
  Administered 2019-01-16: 8 [IU] via SUBCUTANEOUS
  Administered 2019-01-17: 2 [IU] via SUBCUTANEOUS
  Administered 2019-01-17: 15 [IU] via SUBCUTANEOUS
  Administered 2019-01-18 (×2): 3 [IU] via SUBCUTANEOUS
  Administered 2019-01-18 – 2019-01-19 (×3): 2 [IU] via SUBCUTANEOUS
  Administered 2019-01-21: 3 [IU] via SUBCUTANEOUS
  Filled 2019-01-16 (×9): qty 1

## 2019-01-16 MED ORDER — AMPICILLIN-SULBACTAM SODIUM 3 (2-1) G IJ SOLR: 3.0000 g | Freq: Four times a day (QID) | INTRAMUSCULAR | Status: DC

## 2019-01-16 MED ORDER — ACETAMINOPHEN 650 MG RE SUPP: 650.0000 mg | Freq: Four times a day (QID) | RECTAL | Status: DC | PRN

## 2019-01-16 MED ORDER — PIPERACILLIN-TAZOBACTAM 3.375 G IVPB
3.3750 g | Freq: Three times a day (TID) | INTRAVENOUS | Status: DC
  Administered 2019-01-16 – 2019-01-20 (×10): 3.375 g via INTRAVENOUS
  Filled 2019-01-16 (×8): qty 50

## 2019-01-16 MED ORDER — SODIUM CHLORIDE 0.9 % IV SOLN
INTRAVENOUS | Status: DC
  Administered 2019-01-16 – 2019-01-18 (×4): via INTRAVENOUS

## 2019-01-16 MED ORDER — INSULIN ASPART 100 UNIT/ML ~~LOC~~ SOLN
0.0000 [IU] | Freq: Every day | SUBCUTANEOUS | Status: DC
  Administered 2019-01-16: 3 [IU] via SUBCUTANEOUS
  Filled 2019-01-16: qty 1

## 2019-01-16 MED ORDER — PIPERACILLIN-TAZOBACTAM 3.375 G IVPB 30 MIN
3.3750 g | Freq: Once | INTRAVENOUS | Status: AC
  Administered 2019-01-16: 3.375 g via INTRAVENOUS
  Filled 2019-01-16: qty 50

## 2019-01-16 MED ORDER — SODIUM CHLORIDE 0.9 % IV SOLN: 3.0000 g | Freq: Once | INTRAVENOUS | Status: DC

## 2019-01-16 MED ORDER — KETOROLAC TROMETHAMINE 30 MG/ML IJ SOLN
30.0000 mg | Freq: Four times a day (QID) | INTRAMUSCULAR | Status: AC | PRN
  Administered 2019-01-16: 30 mg via INTRAVENOUS
  Filled 2019-01-16: qty 1

## 2019-01-16 MED ORDER — ENOXAPARIN SODIUM 40 MG/0.4ML ~~LOC~~ SOLN
40.0000 mg | SUBCUTANEOUS | Status: DC
  Administered 2019-01-17 – 2019-01-20 (×4): 40 mg via SUBCUTANEOUS
  Filled 2019-01-16 (×4): qty 0.4

## 2019-01-16 MED ORDER — VANCOMYCIN HCL 10 G IV SOLR
1250.0000 mg | Freq: Three times a day (TID) | INTRAVENOUS | Status: DC
  Administered 2019-01-16 – 2019-01-20 (×11): 1250 mg via INTRAVENOUS
  Filled 2019-01-16 (×13): qty 1250

## 2019-01-16 MED ORDER — INSULIN GLARGINE 100 UNIT/ML ~~LOC~~ SOLN
18.0000 [IU] | Freq: Every day | SUBCUTANEOUS | Status: DC
  Administered 2019-01-16: 18 [IU] via SUBCUTANEOUS
  Filled 2019-01-16 (×2): qty 0.18

## 2019-01-16 NOTE — ED Triage Notes (Addendum)
Patient presents with right foot swelling that started about 1 week ago. Patient states her right pinky toe and 4th digit is swollen and dark in color. Also c/o being tired and not wanting to eat regularly X1 week. Patients foot has very foul smell coming from it. Area between pinky toe and 4th digit is bleeding. 4th and 5th digit is raw and open skin noted with discoloration.

## 2019-01-16 NOTE — Progress Notes (Signed)
Pharmacy Antibiotic Note  Katie Gilbert is a 28 y.o. female admitted on 01/16/2019 with wound infection of foot/?osteomyelitis.  Pharmacy has been consulted for Vancomycin and Zosyn dosing. Hx DM-noncompliant, foul smelling drainage of R foot Xray: ?osteomyelitis/deep tissue infection   Plan:  Patient received Vanc 1 gram x1  in ER and 1500mg  x 1 on floor for a total Loading dose of 2500 mg based on weight of 122.5 kg.  Will start Vancomycin 1250 mg IV q8h for goal AUC 400-550 Expected AUC is 534 BMI 33 Scr used: 0.8  -Will start Zosyn 3.375gm EI q8h. Patient received first dose on floor instead of in ER. Watch renal fxn on Zosyn/Vancomycin    Height: 6\' 3"  (190.5 cm) Weight: 262 lb 5.6 oz (119 kg) IBW/kg (Calculated) : 80  Temp (24hrs), Avg:99.3 F (37.4 C), Min:98.9 F (37.2 C), Max:99.6 F (37.6 C)  Recent Labs  Lab 01/16/19 1247 01/16/19 1248  WBC 16.9*  --   CREATININE 0.60  --   LATICACIDVEN  --  1.4    Estimated Creatinine Clearance: 159.4 mL/min (by C-G formula based on SCr of 0.6 mg/dL).    No Known Allergies  Antimicrobials this admission: Vanc 1/30 >>   Zosyn 1/30 >>    Dose adjustments this admission:    Microbiology results:   BCx:     UCx:      Sputum:      MRSA PCR:    Thank you for allowing pharmacy to be a part of this patient's care.  Shadi Larner A 01/16/2019 4:26 PM

## 2019-01-16 NOTE — H&P (Signed)
[facility] [place] [facility]    PATIENT NAME: Katie GlassingLashante Gilbert    MR#:  [id number]  DATE OF BIRTH:  1991/10/14  DATE OF ADMISSION:  01/16/2019  PRIMARY CARE PHYSICIAN: Patient, No Pcp Per   REQUESTING/REFERRING PHYSICIAN: Dr. Dorothea GlassmanPaul Gilbert  CHIEF COMPLAINT:   Chief Complaint  Patient presents with  . Foot Swelling    right    HISTORY OF PRESENT ILLNESS:  Katie Gilbert  is a 28 y.o. female with a known history of diabetes type 1 who presents to the hospital due to right foot pain redness and swelling.  Patient says she noticed some swelling in her right foot a few days ago to about a week ago and it has progressively gotten worse.  She started to noticing significant foul-smelling drainage from the right foot and significant pain on ambulation and therefore was a bit concerned and came to the ER for further evaluation.  Patient was ruled in for sepsis given some leukocytosis, tachycardia and also x-rays findings of the right foot suggesting of osteomyelitis/deep tissue infection.  Hospitalist services were contacted for admission.  Patient has a history of diabetes but is not compliant and has not taken any medications in a long time.  She denies any chest pains, nausea, vomiting, fevers, chills, abdominal pain, dysuria hematuria or any other associated symptoms presently.  PAST MEDICAL HISTORY:   Past Medical History:  Diagnosis Date  . Genital herpes     PAST SURGICAL HISTORY:  History reviewed. No pertinent surgical history.  SOCIAL HISTORY:   Social History   Tobacco Use  . Smoking status: Never Smoker  . Smokeless tobacco: Never Used  Substance Use Topics  . Alcohol use: No    FAMILY HISTORY:   Family History  Problem Relation Age of Onset  . Hypertension Father   . Heart attack Father   . Diabetes Father   . Diabetes Maternal Grandmother     DRUG ALLERGIES:  No Known Allergies  REVIEW OF SYSTEMS:   Review of Systems    Constitutional: Negative for fever and weight loss.  HENT: Negative for congestion, nosebleeds and tinnitus.   Eyes: Negative for blurred vision, double vision and redness.  Respiratory: Negative for cough, hemoptysis and shortness of breath.   Cardiovascular: Negative for chest pain, orthopnea, leg swelling and PND.  Gastrointestinal: Negative for abdominal pain, diarrhea, melena, nausea and vomiting.  Genitourinary: Negative for dysuria, hematuria and urgency.  Musculoskeletal: Positive for joint pain (Right foot swelling/pain). Negative for falls.  Neurological: Negative for dizziness, tingling, sensory change, focal weakness, seizures, weakness and headaches.  Endo/Heme/Allergies: Negative for polydipsia. Does not bruise/bleed easily.  Psychiatric/Behavioral: Negative for depression and memory loss. The patient is not nervous/anxious.     MEDICATIONS AT HOME:   Prior to Admission medications   Not on File      VITAL SIGNS:  Blood pressure 134/87, pulse (!) 112, temperature 98.9 F (37.2 C), resp. rate 18, height 6\' 3"  (1.905 m), weight 122.5 kg, last menstrual period 01/01/2019, SpO2 100 %.  PHYSICAL EXAMINATION:  Physical Exam  GENERAL:  28 y.o.-year-old patient lying in the bed with no acute distress.  EYES: Pupils equal, round, reactive to light and accommodation. No scleral icterus. Extraocular muscles intact.  HEENT: Head atraumatic, normocephalic. Oropharynx and nasopharynx clear. No oropharyngeal erythema, moist oral mucosa  NECK:  Supple, no jugular venous distention. No thyroid enlargement, no tenderness.  LUNGS: Normal breath sounds bilaterally, no wheezing, rales, rhonchi. No use of  accessory muscles of respiration.  CARDIOVASCULAR: S1, S2 RRR. No murmurs, rubs, gallops, clicks.  ABDOMEN: Soft, nontender, nondistended. Bowel sounds present. No organomegaly or mass.  EXTREMITIES: No cyanosis, clubbing, +1-2 edema on the right leg compared to the left.  Right foot  swelling with right fifth toe redness and swelling and warmth and induration as seen below      NEUROLOGIC: Cranial nerves II through XII are intact. No focal Motor or sensory deficits appreciated b/l PSYCHIATRIC: The patient is alert and oriented x 3  SKIN: No obvious rash, lesion, or ulcer.   LABORATORY PANEL:   CBC Recent Labs  Lab 01/16/19 1247  WBC 16.9*  HGB 11.2*  HCT 33.7*  PLT 398   ------------------------------------------------------------------------------------------------------------------  Chemistries  Recent Labs  Lab 01/16/19 1247  NA 129*  K 3.6  CL 95*  CO2 22  GLUCOSE 355*  BUN 8  CREATININE 0.60  CALCIUM 8.6*  AST 12*  ALT 11  ALKPHOS 110  BILITOT 0.9   ------------------------------------------------------------------------------------------------------------------  Cardiac Enzymes No results for input(s): TROPONINI in the last 168 hours. ------------------------------------------------------------------------------------------------------------------  RADIOLOGY:  Dg Foot Complete Right  Result Date: 01/16/2019 CLINICAL DATA:  Right foot pain and swelling for 1 week. EXAM: RIGHT FOOT COMPLETE - 3+ VIEW COMPARISON:  None. FINDINGS: The PIP joint of the fifth toe is destroyed consistent with septic arthritis and adjacent osteomyelitis. There is also changes of osteomyelitis involving the proximal aspect of the proximal phalanx and possible pathologic fracture. The fifth MTP joint is maintained. The other bony structures are intact. Diffuse soft tissue swelling around the lateral aspect of the forefoot with gas in the soft tissues. IMPRESSION: Plain film findings consistent with septic arthritis at the PIP joint of the fifth toe and osteomyelitis involving the proximal and middle phalanges. Extensive soft tissue swelling and gas in the soft tissues of the lateral forefoot. Electronically Signed   By: Rudie Meyer M.D.   On: 01/16/2019 13:49      IMPRESSION AND PLAN:   28 year old female with past medical history of diabetes who presents to the hospital due to right foot pain swelling and redness.  1.  Right foot swelling-suspected to be secondary to osteomyelitis/diabetic foot ulcer/septic arthritis. - Patient's x-ray of the right foot is suggestive of septic arthritis and possible osteomyelitis. -I will get a MRI of the right foot.  Empirically treat the patient with IV vancomycin, Zosyn. -Get a podiatry consult.  Notified Dr. Ether Griffins.  2.  Sepsis-secondary to right foot osteomyelitis/diabetic foot ulcer.  Patient rules in given tachycardia, leukocytosis and clinical findings as mentioned above. -Continue IV fluids, IV antibiotics as mentioned above.  Follow cultures.    3.  Leukocytosis-secondary to the infection -Follow white cell count with IV antibiotic therapy.    4.  Diabetes type 1-patient is noncompliant has not taken the medications for a long time. -We will check hemoglobin A1c.  Care diabetes coordinator consult.  Placed patient on sliding scale insulin for now.    All the records are reviewed and case discussed with ED provider. Management plans discussed with the patient, family and they are in agreement.  CODE STATUS: Full code  TOTAL TIME TAKING CARE OF THIS PATIENT: 45 minutes.    Houston Siren M.D on 01/16/2019 at 2:50 PM  Between 7am to 6pm - Pager - [phone]  After 6pm go to [email or web address] - password [email or web address]  [facility]  Office  [phone]  CC: Primary care physician; Patient, No Pcp Per

## 2019-01-16 NOTE — Consult Note (Signed)
ORTHOPAEDIC CONSULTATION  REQUESTING PHYSICIAN: Houston Siren, MD  Chief Complaint: Right foot infection  HPI: Katie Gilbert is a 28 y.o. female who complains of pain swelling and drainage to her right foot.  She states for the last week it is been swelling and getting worse.  She works at [employer].  She states she has not had an appetite for the last week or so.  Noticed quite a bit of swelling and was seen in the ER today.  Obvious drainage with foul odor was noted.  X-ray showed osteomyelitis of the fifth toe region.  White blood cell count was elevated at 16,000.  Admitted for IV antibiotics and likely surgical I&D.  Past Medical History:  Diagnosis Date  . Genital herpes    History reviewed. No pertinent surgical history. Social History   Socioeconomic History  . Marital status: Single    Spouse name: Not on file  . Number of children: Not on file  . Years of education: Not on file  . Highest education level: Not on file  Occupational History  . Not on file  Social Needs  . Financial resource strain: Not on file  . Food insecurity:    Worry: Not on file    Inability: Not on file  . Transportation needs:    Medical: Not on file    Non-medical: Not on file  Tobacco Use  . Smoking status: Never Smoker  . Smokeless tobacco: Never Used  Substance and Sexual Activity  . Alcohol use: No  . Drug use: No  . Sexual activity: Not on file  Lifestyle  . Physical activity:    Days per week: Not on file    Minutes per session: Not on file  . Stress: Not on file  Relationships  . Social connections:    Talks on phone: Not on file    Gets together: Not on file    Attends religious service: Not on file    Active member of club or organization: Not on file    Attends meetings of clubs or organizations: Not on file    Relationship status: Not on file  Other Topics Concern  . Not on file  Social History Narrative  . Not on file   Family History  Problem Relation Age of  Onset  . Hypertension Father   . Heart attack Father   . Diabetes Father   . Diabetes Maternal Grandmother    No Known Allergies Prior to Admission medications   Not on File   Dg Foot Complete Right  Result Date: 01/16/2019 CLINICAL DATA:  Right foot pain and swelling for 1 week. EXAM: RIGHT FOOT COMPLETE - 3+ VIEW COMPARISON:  None. FINDINGS: The PIP joint of the fifth toe is destroyed consistent with septic arthritis and adjacent osteomyelitis. There is also changes of osteomyelitis involving the proximal aspect of the proximal phalanx and possible pathologic fracture. The fifth MTP joint is maintained. The other bony structures are intact. Diffuse soft tissue swelling around the lateral aspect of the forefoot with gas in the soft tissues. IMPRESSION: Plain film findings consistent with septic arthritis at the PIP joint of the fifth toe and osteomyelitis involving the proximal and middle phalanges. Extensive soft tissue swelling and gas in the soft tissues of the lateral forefoot. Electronically Signed   By: Rudie Meyer M.D.   On: 01/16/2019 13:49    Positive ROS: All other systems have been reviewed and were otherwise negative with the exception of those  mentioned in the HPI and as above.  12 point ROS was performed.  Physical Exam: General: Alert and oriented.  No apparent distress.  Vascular:  Left foot:Dorsalis Pedis:  present Posterior Tibial:  present  Right foot: Dorsalis Pedis:  present Posterior Tibial:  diminished secondary to edema.  Neuro:absent protective sensation.  Some gross sensation was noted.  Derm: Right foot with diffuse ecchymosis/erythema.  The right fifth toe is necrotic.  There is noted purulent drainage from the webspace.  Maceration to the plantar aspect of the right forefoot.  Erythema ecchymotic discoloration to the ankle with lymphangitic streaking on the right lower leg is noted.  Ortho/MS: Marked diffuse edema to the right foot and  ankle.  Assessment: Osteomyelitis right fifth toe with likely gas producing infection  Diabetes with neuropathy  Plan: MRI has been performed this evening.  She will need surgical I&D with at minimal amputation of the fifth toe and joint and possible further amputation as the fourth toe is erythematous and swollen.  With gas in the x-ray I suspect there is a fair amount of necrotic tissue deep in the region.  I discussed that had minimum we will have to perform a likely partial fifth ray amputation with debridement of multiple areas with the possibility of return to the OR down the road.  We will make n.p.o. after midnight tonight.  Plan for surgery tomorrow.  Can discuss MRI findings tomorrow as well.    Irean HongFowler, Madelene Kaatz A, DPM Cell [phone]   01/16/2019 5:45 PM

## 2019-01-16 NOTE — ED Notes (Signed)
1 set blood cultures drawn and sent pending orders.

## 2019-01-16 NOTE — ED Provider Notes (Signed)
[facility] Emergency Department Provider Note   ____________________________________________   First MD Initiated Contact with Patient 01/16/19 1319     (approximate)  I have reviewed the triage vital signs and the nursing notes.   HISTORY  Chief Complaint Foot Swelling (right)    HPI Katie Gilbert is a 28 y.o. female who comes in complaining of right foot swelling and pain for a week or more.  sHe says it is draining and smelling bad.  She has diabetes but she is not been taking care of it and she does not a primary care doctor.   Past Medical History:  Diagnosis Date  . Genital herpes     There are no active problems to display for this patient.   History reviewed. No pertinent surgical history.  Prior to Admission medications   Medication Sig Start Date End Date Taking? Authorizing Provider  albuterol (PROVENTIL HFA;VENTOLIN HFA) 108 (90 Base) MCG/ACT inhaler Inhale 1-2 puffs into the lungs every 6 (six) hours as needed for wheezing or shortness of breath. 12/25/16   Lutricia Feil, PA-C  brompheniramine-pseudoephedrine-DM 30-2-10 MG/5ML syrup Take 5 mLs by mouth 4 (four) times daily as needed. 12/23/16   Joni Reining, PA-C  chlorpheniramine-HYDROcodone (TUSSIONEX PENNKINETIC ER) 10-8 MG/5ML SUER Take 5 mLs by mouth 2 (two) times daily. 12/25/16   Lutricia Feil, PA-C  fluticasone (FLONASE) 50 MCG/ACT nasal spray Place 2 sprays into both nostrils daily. 12/25/16   Lutricia Feil, PA-C  HYDROcodone-acetaminophen (NORCO/VICODIN) 5-325 MG tablet Take 1 tablet by mouth every 6 (six) hours as needed for moderate pain. 09/24/17   Little, Traci M, PA-C  ibuprofen (ADVIL,MOTRIN) 600 MG tablet Take 1 tablet (600 mg total) by mouth every 8 (eight) hours as needed. 04/18/16   Tommi Rumps, PA-C  LORazepam (ATIVAN) 1 MG tablet Take 1 tablet (1 mg total) by mouth every 8 (eight) hours as needed for anxiety. 06/14/18 06/14/19  Merrily Brittle, MD    metFORMIN (GLUCOPHAGE) 500 MG tablet Take 1 tablet (500 mg total) by mouth 2 (two) times daily with a meal. 06/14/18 06/14/19  Merrily Brittle, MD    Allergies Patient has no known allergies.  Family History  Problem Relation Age of Onset  . Hypertension Father   . Heart attack Father     Social History Social History   Tobacco Use  . Smoking status: Never Smoker  . Smokeless tobacco: Never Used  Substance Use Topics  . Alcohol use: No  . Drug use: No    Review of systems Constitutional: No fever/chills Eyes: No visual changes. ENT: No sore throat. Cardiovascular: Denies chest pain. Respiratory: Denies shortness of breath. Gastrointestinal: No abdominal pain.  No nausea, no vomiting.  No diarrhea.  No constipation. Genitourinary: Negative for dysuria. Musculoskeletal: Negative for back pain. Skin: Negative for rash. Neurological: Negative for headaches, focal weakness  ____________________________________________   PHYSICAL EXAM:  VITAL SIGNS: ED Triage Vitals  Enc Vitals Group     BP 01/16/19 1234 (!) 164/99     Pulse Rate 01/16/19 1234 (!) 126     Resp 01/16/19 1234 16     Temp 01/16/19 1234 99.4 F (37.4 C)     Temp Source 01/16/19 1234 Oral     SpO2 01/16/19 1234 97 %     Weight 01/16/19 1235 270 lb (122.5 kg)     Height 01/16/19 1235 6\' 3"  (1.905 m)     Head Circumference --  Peak Flow --      Pain Score 01/16/19 1235 9     Pain Loc --      Pain Edu? --      Excl. in GC? --     Constitutional: Alert and oriented. Well appearing and in no acute distress. Eyes: Conjunctivae are normal.  Head: Atraumatic. Nose: No congestion/rhinnorhea. Mouth/Throat: Mucous membranes are moist.  Oropharynx non-erythematous. Neck: No stridor. Cardiovascular: Normal rate, regular rhythm. Grossly normal heart sounds.  Good peripheral circulation. Respiratory: Normal respiratory effort.  No retractions. Lungs CTAB. Gastrointestinal: Soft and nontender. No  distention. No abdominal bruits. No CVA tenderness. Musculoskeletal: Right lower leg is swollen.  Skin is darker in color is look like somewhat chronic changes.  Patient's foot is also swollen there is redness and some pussy discharge between the fourth and fifth toe fourth and fifth toes are swollen and red as well but smells bad.  Entire foot is swollen Neurologic:  Normal speech and language. No gross focal neurologic deficits are appreciated. No gait instability. Skin:  Skin is warm, dry and intact. No rash noted. Psychiatric: Mood and affect are normal. Speech and behavior are normal.  ____________________________________________   LABS (all labs ordered are listed, but only abnormal results are displayed)  Labs Reviewed  COMPREHENSIVE METABOLIC PANEL - Abnormal; Notable for the following components:      Result Value   Sodium 129 (*)    Chloride 95 (*)    Glucose, Bld 355 (*)    Calcium 8.6 (*)    Albumin 3.2 (*)    AST 12 (*)    All other components within normal limits  CBC WITH DIFFERENTIAL/PLATELET - Abnormal; Notable for the following components:   WBC 16.9 (*)    Hemoglobin 11.2 (*)    HCT 33.7 (*)    Neutro Abs 14.3 (*)    Monocytes Absolute 1.1 (*)    Abs Immature Granulocytes 0.11 (*)    All other components within normal limits  LACTIC ACID, PLASMA  CK  LACTIC ACID, PLASMA  URINALYSIS, COMPLETE (UACMP) WITH MICROSCOPIC  POC URINE PREG, ED   ____________________________________________  EKG   ____________________________________________  RADIOLOGY  ED MD interpretation: Tray read by radiology reviewed by me shows osteomyelitis what looks like septic arthritis and some air in the soft tissue.  Official radiology report(s): Dg Foot Complete Right  Result Date: 01/16/2019 CLINICAL DATA:  Right foot pain and swelling for 1 week. EXAM: RIGHT FOOT COMPLETE - 3+ VIEW COMPARISON:  None. FINDINGS: The PIP joint of the fifth toe is destroyed consistent with  septic arthritis and adjacent osteomyelitis. There is also changes of osteomyelitis involving the proximal aspect of the proximal phalanx and possible pathologic fracture. The fifth MTP joint is maintained. The other bony structures are intact. Diffuse soft tissue swelling around the lateral aspect of the forefoot with gas in the soft tissues. IMPRESSION: Plain film findings consistent with septic arthritis at the PIP joint of the fifth toe and osteomyelitis involving the proximal and middle phalanges. Extensive soft tissue swelling and gas in the soft tissues of the lateral forefoot. Electronically Signed   By: Rudie MeyerP.  Gallerani M.D.   On: 01/16/2019 13:49    ____________________________________________   PROCEDURES  Procedure(s) perfo  Procedures  Critical Care performed:   ____________________________________________   INITIAL IMPRESSION / ASSESSMENT AND PLAN / ED COURSE We will admit the patient hospitalist will stabilize her and will get Dr. Ether GriffinsFowler podiatry involved.  ____________________________________________   FINAL CLINICAL IMPRESSION(S) / ED DIAGNOSES  Final diagnoses:  Diabetic foot infection (HCC)  Osteomyelitis of right foot, unspecified type [facility](HCC)     ED Discharge Orders    None       Note:  This document was prepared using Dragon voice recognition software and may include unintentional dictation errors.    Arnaldo NatalMalinda, Lilyahna Sirmon F, MD 01/16/19 1414

## 2019-01-16 NOTE — Progress Notes (Signed)
Inpatient Diabetes Program Recommendations  AACE/ADA: New Consensus Statement on Inpatient Glycemic Control   Target Ranges:  Prepandial:   less than 140 mg/dL      Peak postprandial:   less than 180 mg/dL (1-2 hours)      Critically ill patients:  140 - 180 mg/dL   Results for Katie Gilbert, Katie Gilbert (MRN [id number]) as of 01/16/2019 15:41  Ref. Range 01/16/2019 12:47  Glucose Latest Ref Range: 70 - 99 mg/dL 892 (H)  Results for Katie Gilbert, Katie Gilbert (MRN [id number]) as of 01/16/2019 15:41  Ref. Range 06/13/2018 23:31  Hemoglobin A1C Latest Ref Range: 4.8 - 5.6 % 13.0 (H)    Review of Glycemic Control  Diabetes history: DM2 Outpatient Diabetes medications: Metformin 500 mg BID (per ED MD note) Current orders for Inpatient glycemic control: Novolog 0-15 units TID with meals, Novolog 0-5 units QHS  Inpatient Diabetes Program Recommendations:  Insulin - Basal: Please consider ordering Lantus 18 units Q24H (based on 122.5 kg x 0.15 units). Correction (SSI): Noted Novolog moderate correction scale ordered along with bedtime correction scale. HgbA1C: A1C in process.  NOTE: Noted consult for Diabetes Coordinator. Chart reviewed. Patient is currently in ED and will be admitted with right foot swelling with possible osteomyelitis and sepsis. Initial glucose 355 mg/dl on labs. In reviewing chart, noted patient has been on insulin in the past but stopped taking it despite A1C of 13%. Anticipate current A1C will be very high and patient needs to be taking insulin consistently as an outpatient. Will plan to follow up on A1C results and talk with patient tomorrow (01/17/19).    Thanks, Orlando Penner, RN, MSN, CDE Diabetes Coordinator Inpatient Diabetes Program [phone] (Team Pager from 8am to 5pm)

## 2019-01-16 NOTE — ED Notes (Signed)
ED TO INPATIENT HANDOFF REPORT  Name/Age/Gender Katie Gilbert 28 y.o. female  Code Status   Home/SNF/Other Home  Chief Complaint weakness  Level of Care/Admitting Diagnosis ED Disposition    ED Disposition Condition Comment   Admit  Hospital Area: [facility] [[id number]]  Level of Care: Med-Surg [16]  Diagnosis: Diabetic foot ulcer [facility]) [[id number]]  Admitting Physician: Houston Siren [[id number]]  Attending Physician: Houston Siren [[id number]]  Estimated length of stay: past midnight tomorrow  Certification:: I certify this patient will need inpatient services for at least 2 midnights  PT Class (Do Not Modify): Inpatient [101]  PT Acc Code (Do Not Modify): Private [1]       Medical History Past Medical History:  Diagnosis Date  . Genital herpes     Allergies No Known Allergies  IV Location/Drains/Wounds Patient Lines/Drains/Airways Status   Active Line/Drains/Airways    Name:   Placement date:   Placement time:   Site:   Days:   Peripheral IV 01/16/19 Right Forearm   01/16/19    1409    Forearm   less than 1          Labs/Imaging Results for orders placed or performed during the hospital encounter of 01/16/19 (from the past 48 hour(s))  Comprehensive metabolic panel     Status: Abnormal   Collection Time: 01/16/19 12:47 PM  Result Value Ref Range   Sodium 129 (L) 135 - 145 mmol/L   Potassium 3.6 3.5 - 5.1 mmol/L   Chloride 95 (L) 98 - 111 mmol/L   CO2 22 22 - 32 mmol/L   Glucose, Bld 355 (H) 70 - 99 mg/dL   BUN 8 6 - 20 mg/dL   Creatinine, Ser 0.86 0.44 - 1.00 mg/dL   Calcium 8.6 (L) 8.9 - 10.3 mg/dL   Total Protein 8.1 6.5 - 8.1 g/dL   Albumin 3.2 (L) 3.5 - 5.0 g/dL   AST 12 (L) 15 - 41 U/L   ALT 11 0 - 44 U/L   Alkaline Phosphatase 110 38 - 126 U/L   Total Bilirubin 0.9 0.3 - 1.2 mg/dL   GFR calc non Af Amer >60 >60 mL/min   GFR calc Af Amer >60 >60 mL/min   Anion gap 12 5 - 15    Comment: Performed at [facility],  [address] [place], [place] [address]  CBC with Differential     Status: Abnormal   Collection Time: 01/16/19 12:47 PM  Result Value Ref Range   WBC 16.9 (H) 4.0 - 10.5 K/uL   RBC 4.11 3.87 - 5.11 MIL/uL   Hemoglobin 11.2 (L) 12.0 - 15.0 g/dL   HCT 96.2 (L) 95.2 - 84.1 %   MCV 82.0 80.0 - 100.0 fL   MCH 27.3 26.0 - 34.0 pg   MCHC 33.2 30.0 - 36.0 g/dL   RDW 32.4 40.1 - 02.7 %   Platelets 398 150 - 400 K/uL   nRBC 0.0 0.0 - 0.2 %   Neutrophils Relative % 85 %   Neutro Abs 14.3 (H) 1.7 - 7.7 K/uL   Lymphocytes Relative 8 %   Lymphs Abs 1.3 0.7 - 4.0 K/uL   Monocytes Relative 6 %   Monocytes Absolute 1.1 (H) 0.1 - 1.0 K/uL   Eosinophils Relative 0 %   Eosinophils Absolute 0.1 0.0 - 0.5 K/uL   Basophils Relative 0 %   Basophils Absolute 0.1 0.0 - 0.1 K/uL   Immature Granulocytes 1 %  Abs Immature Granulocytes 0.11 (H) 0.00 - 0.07 K/uL    Comment: Performed at [facility], [address] [place], [place] [address]  Lactic acid, plasma     Status: None   Collection Time: 01/16/19 12:48 PM  Result Value Ref Range   Lactic Acid, Venous 1.4 0.5 - 1.9 mmol/L    Comment: Performed at [facility], [address] [place], [place] [address]  CK     Status: None   Collection Time: 01/16/19 12:48 PM  Result Value Ref Range   Total CK 46 38 - 234 U/L    Comment: Performed at [facility], [address] [place], [place] [address]   Dg Foot Complete Right  Result Date: 01/16/2019 CLINICAL DATA:  Right foot pain and swelling for 1 week. EXAM: RIGHT FOOT COMPLETE - 3+ VIEW COMPARISON:  None. FINDINGS: The PIP joint of the fifth toe is destroyed consistent with septic arthritis and adjacent osteomyelitis. There is also changes of osteomyelitis involving the proximal aspect of the proximal phalanx and possible pathologic fracture. The fifth MTP joint is maintained. The other bony structures are intact. Diffuse soft tissue swelling around the lateral aspect  of the forefoot with gas in the soft tissues. IMPRESSION: Plain film findings consistent with septic arthritis at the PIP joint of the fifth toe and osteomyelitis involving the proximal and middle phalanges. Extensive soft tissue swelling and gas in the soft tissues of the lateral forefoot. Electronically Signed   By: Rudie Meyer M.D.   On: 01/16/2019 13:49    Pending Labs Unresulted Labs (From admission, onward)    Start     Ordered   01/16/19 1501  Hemoglobin A1c  Add-on,   AD     01/16/19 1500   01/16/19 1248  Lactic acid, plasma  Now then every 2 hours,   STAT     01/16/19 1247   01/16/19 1248  Urinalysis, Complete w Microscopic  ONCE - STAT,   STAT     01/16/19 1247   Signed and Held  HIV antibody (Routine Testing)  Once,   R     Signed and Held   Signed and Held  Basic metabolic panel  Tomorrow morning,   R     Signed and Held   Signed and Held  CBC  Tomorrow morning,   R     Signed and Held   Signed and Held  CBC  (enoxaparin (LOVENOX)    CrCl >/= 30 ml/min)  Once,   R    Comments:  Baseline for enoxaparin therapy IF NOT ALREADY DRAWN.  Notify MD if PLT < 100 K.    Signed and Held   Signed and Held  Creatinine, serum  (enoxaparin (LOVENOX)    CrCl >/= 30 ml/min)  Once,   R    Comments:  Baseline for enoxaparin therapy IF NOT ALREADY DRAWN.    Signed and Held   Signed and Held  Creatinine, serum  (enoxaparin (LOVENOX)    CrCl >/= 30 ml/min)  Weekly,   R    Comments:  while on enoxaparin therapy    Signed and Held          Vitals/Pain Today's Vitals   01/16/19 1235 01/16/19 1320 01/16/19 1343 01/16/19 1521  BP:   134/87 137/82  Pulse:   (!) 112 (!) 114  Resp:   18 18  Temp:   98.9 F (37.2 C) 99.3 F (37.4 C)  TempSrc:  Oral  SpO2:   100% 98%  Weight: 122.5 kg     Height: 6\' 3"  (1.905 m)     PainSc: 9  10-Worst pain ever  0-No pain    Isolation Precautions No active isolations  Medications Medications  vancomycin (VANCOCIN) IVPB 1000 mg/200 mL premix  (1,000 mg Intravenous New Bag/Given 01/16/19 1446)  piperacillin-tazobactam (ZOSYN) IVPB 3.375 g (has no administration in time range)  vancomycin (VANCOCIN) 1,500 mg in sodium chloride 0.9 % 500 mL IVPB (has no administration in time range)  morphine 2 MG/ML injection 2 mg (2 mg Intravenous Given 01/16/19 1506)  ketorolac (TORADOL) 30 MG/ML injection 30 mg (has no administration in time range)    Mobility walks

## 2019-01-17 ENCOUNTER — Inpatient Hospital Stay: Payer: No Typology Code available for payment source | Admitting: Anesthesiology

## 2019-01-17 ENCOUNTER — Encounter
Admission: EM | Disposition: A | Discharge status: Home / Self Care | Payer: Self-pay | Source: Home / Self Care | Attending: Internal Medicine

## 2019-01-17 ENCOUNTER — Encounter: Payer: Self-pay | Admitting: Podiatry

## 2019-01-17 HISTORY — PX: I & D EXTREMITY: SHX5045

## 2019-01-17 LAB — BASIC METABOLIC PANEL
Anion gap: 9 (ref 5–15)
BUN: 11 mg/dL (ref 6–20)
CO2: 25 mmol/L (ref 22–32)
Calcium: 7.7 mg/dL — ABNORMAL LOW (ref 8.9–10.3)
Chloride: 97 mmol/L — ABNORMAL LOW (ref 98–111)
Creatinine, Ser: 0.75 mg/dL (ref 0.44–1.00)
GFR calc Af Amer: >60 mL/min (ref >60)
GFR calc non Af Amer: >60 mL/min (ref >60)
Glucose, Bld: 376 mg/dL — ABNORMAL HIGH (ref 70–99)
Potassium: 3.5 mmol/L (ref 3.5–5.1)
Sodium: 131 mmol/L — ABNORMAL LOW (ref 135–145)

## 2019-01-17 LAB — GLUCOSE, CAPILLARY
GLUCOSE-CAPILLARY: 156 mg/dL — AB (ref 70–99)
Glucose-Capillary: 351 mg/dL — ABNORMAL HIGH (ref 70–99)
Glucose-Capillary: 187 mg/dL — ABNORMAL HIGH (ref 70–99)
Glucose-Capillary: 144 mg/dL — ABNORMAL HIGH (ref 70–99)
Glucose-Capillary: 167 mg/dL — ABNORMAL HIGH (ref 70–99)

## 2019-01-17 LAB — HIV ANTIBODY (ROUTINE TESTING W REFLEX): HIV Screen 4th Generation wRfx: NONREACTIVE

## 2019-01-17 LAB — CBC
HEMATOCRIT: 29.5 % — AB (ref 36.0–46.0)
Hemoglobin: 9.6 g/dL — ABNORMAL LOW (ref 12.0–15.0)
MCH: 27.4 pg (ref 26.0–34.0)
MCHC: 32.5 g/dL (ref 30.0–36.0)
MCV: 84 fL (ref 80.0–100.0)
Platelets: 354 10*3/uL (ref 150–400)
RBC: 3.51 MIL/uL — ABNORMAL LOW (ref 3.87–5.11)
RDW: 11.8 % (ref 11.5–15.5)
WBC: 13.4 10*3/uL — ABNORMAL HIGH (ref 4.0–10.5)
nRBC: 0 % (ref 0.0–0.2)

## 2019-01-17 LAB — PREGNANCY, URINE: Preg Test, Ur: NEGATIVE

## 2019-01-17 SURGERY — IRRIGATION AND DEBRIDEMENT EXTREMITY
Anesthesia: General | Laterality: Right

## 2019-01-17 MED ORDER — FENTANYL CITRATE (PF) 100 MCG/2ML IJ SOLN
INTRAMUSCULAR | Status: AC
  Filled 2019-01-17: qty 2

## 2019-01-17 MED ORDER — THROMBIN 5000 UNITS EX SOLR
CUTANEOUS | Status: AC
  Filled 2019-01-17: qty 5000

## 2019-01-17 MED ORDER — ONDANSETRON HCL 4 MG/2ML IJ SOLN: 4.0000 mg | Freq: Once | INTRAMUSCULAR | Status: DC | PRN

## 2019-01-17 MED ORDER — MIDAZOLAM HCL 2 MG/2ML IJ SOLN
INTRAMUSCULAR | Status: AC
  Filled 2019-01-17: qty 2

## 2019-01-17 MED ORDER — INSULIN GLARGINE 100 UNIT/ML ~~LOC~~ SOLN
25.0000 [IU] | Freq: Every day | SUBCUTANEOUS | Status: DC
  Administered 2019-01-17 – 2019-01-19 (×3): 25 [IU] via SUBCUTANEOUS
  Filled 2019-01-17 (×4): qty 0.25

## 2019-01-17 MED ORDER — FENTANYL CITRATE (PF) 100 MCG/2ML IJ SOLN: 25.0000 ug | INTRAMUSCULAR | Status: DC | PRN

## 2019-01-17 MED ORDER — PROPOFOL 10 MG/ML IV BOLUS
INTRAVENOUS | Status: AC
  Filled 2019-01-17: qty 40

## 2019-01-17 MED ORDER — FENTANYL CITRATE (PF) 100 MCG/2ML IJ SOLN
INTRAMUSCULAR | Status: DC | PRN
  Administered 2019-01-17 (×4): 25 ug via INTRAVENOUS

## 2019-01-17 MED ORDER — ACETAMINOPHEN 10 MG/ML IV SOLN
INTRAVENOUS | Status: AC
  Filled 2019-01-17: qty 100

## 2019-01-17 MED ORDER — LIVING WELL WITH DIABETES BOOK
Freq: Once | Status: AC
  Administered 2019-01-17: 17:00:00
  Filled 2019-01-17: qty 1

## 2019-01-17 MED ORDER — PROPOFOL 10 MG/ML IV BOLUS
INTRAVENOUS | Status: DC | PRN
  Administered 2019-01-17: 200 mg via INTRAVENOUS
  Administered 2019-01-17: 25 mg via INTRAVENOUS

## 2019-01-17 MED ORDER — ONDANSETRON HCL 4 MG/2ML IJ SOLN
INTRAMUSCULAR | Status: DC | PRN
  Administered 2019-01-17: 4 mg via INTRAVENOUS

## 2019-01-17 MED ORDER — BUPIVACAINE HCL 0.5 % IJ SOLN
INTRAMUSCULAR | Status: DC | PRN
  Administered 2019-01-17: 20 mL

## 2019-01-17 MED ORDER — OXYCODONE-ACETAMINOPHEN 5-325 MG PO TABS
1.0000 | ORAL_TABLET | ORAL | Status: DC | PRN
  Administered 2019-01-17 – 2019-01-19 (×3): 1 via ORAL
  Filled 2019-01-17 (×3): qty 1

## 2019-01-17 MED ORDER — CHLORHEXIDINE GLUCONATE 4 % EX LIQD: 60.0000 mL | Freq: Once | CUTANEOUS | Status: DC

## 2019-01-17 MED ORDER — JUVEN PO PACK
1.0000 | PACK | Freq: Two times a day (BID) | ORAL | Status: DC
  Administered 2019-01-18 – 2019-01-20 (×5): 1 via ORAL
  Filled 2019-01-17 (×2): qty 1

## 2019-01-17 MED ORDER — ACETAMINOPHEN 10 MG/ML IV SOLN
INTRAVENOUS | Status: DC | PRN
  Administered 2019-01-17: 1000 mg via INTRAVENOUS

## 2019-01-17 MED ORDER — INSULIN STARTER KIT- PEN NEEDLES (ENGLISH)
1.0000 | Freq: Once | Status: AC
  Administered 2019-01-17: 1
  Filled 2019-01-17: qty 1

## 2019-01-17 MED ORDER — MIDAZOLAM HCL 2 MG/2ML IJ SOLN
INTRAMUSCULAR | Status: DC | PRN
  Administered 2019-01-17: 2 mg via INTRAVENOUS

## 2019-01-17 MED ORDER — LIDOCAINE HCL (PF) 2 % IJ SOLN
INTRAMUSCULAR | Status: AC
  Filled 2019-01-17: qty 10

## 2019-01-17 MED ORDER — ADULT MULTIVITAMIN W/MINERALS CH
1.0000 | ORAL_TABLET | Freq: Every day | ORAL | Status: DC
  Administered 2019-01-18 – 2019-01-21 (×4): 1 via ORAL
  Filled 2019-01-17 (×5): qty 1

## 2019-01-17 MED ORDER — THROMBIN 5000 UNITS EX SOLR
CUTANEOUS | Status: DC | PRN
  Administered 2019-01-17: 5000 [IU] via TOPICAL

## 2019-01-17 MED ORDER — LIDOCAINE HCL (CARDIAC) PF 100 MG/5ML IV SOSY
PREFILLED_SYRINGE | INTRAVENOUS | Status: DC | PRN
  Administered 2019-01-17: 100 mg via INTRAVENOUS

## 2019-01-17 SURGICAL SUPPLY — 63 items
BANDAGE ACE 4X5 VEL STRL LF (GAUZE/BANDAGES/DRESSINGS) ×2 IMPLANT
BLADE MED AGGRESSIVE (BLADE) ×2 IMPLANT
BLADE OSC/SAGITTAL MD 5.5X18 (BLADE) IMPLANT
BLADE OSCILLATING/SAGITTAL (BLADE)
BLADE SW THK.38XMED LNG THN (BLADE) IMPLANT
BNDG COHESIVE 4X5 TAN STRL (GAUZE/BANDAGES/DRESSINGS) ×2 IMPLANT
BNDG COHESIVE 6X5 TAN STRL LF (GAUZE/BANDAGES/DRESSINGS) ×2 IMPLANT
BNDG CONFORM 2 STRL LF (GAUZE/BANDAGES/DRESSINGS) ×2 IMPLANT
BNDG CONFORM 3 STRL LF (GAUZE/BANDAGES/DRESSINGS) ×2 IMPLANT
BNDG ESMARK 4X12 TAN STRL LF (GAUZE/BANDAGES/DRESSINGS) ×2 IMPLANT
BNDG GAUZE 4.5X4.1 6PLY STRL (MISCELLANEOUS) ×2 IMPLANT
CANISTER SUCT 1200ML W/VALVE (MISCELLANEOUS) ×2 IMPLANT
CANISTER SUCT 3000ML PPV (MISCELLANEOUS) ×2 IMPLANT
COVER WAND RF STERILE (DRAPES) ×2 IMPLANT
CUFF TOURN 18 STER (MISCELLANEOUS) ×2 IMPLANT
CUFF TOURN DUAL PL 12 NO SLV (MISCELLANEOUS) IMPLANT
DRAPE FLUOR MINI C-ARM 54X84 (DRAPES) ×2 IMPLANT
DRAPE XRAY CASSETTE 23X24 (DRAPES) IMPLANT
DRESSING ALLEVYN 4X4 (MISCELLANEOUS) IMPLANT
DURAPREP 26ML APPLICATOR (WOUND CARE) ×2 IMPLANT
ELECT REM PT RETURN 9FT ADLT (ELECTROSURGICAL) ×2
ELECTRODE REM PT RTRN 9FT ADLT (ELECTROSURGICAL) ×1 IMPLANT
GAUZE PACKING 1/4 X5 YD (GAUZE/BANDAGES/DRESSINGS) ×2 IMPLANT
GAUZE PACKING IODOFORM 1X5 (MISCELLANEOUS) ×2 IMPLANT
GAUZE PETRO XEROFOAM 1X8 (MISCELLANEOUS) ×2 IMPLANT
GAUZE SPONGE 4X4 12PLY STRL (GAUZE/BANDAGES/DRESSINGS) ×2 IMPLANT
GLOVE BIO SURGEON STRL SZ7.5 (GLOVE) ×2 IMPLANT
GLOVE INDICATOR 8.0 STRL GRN (GLOVE) ×2 IMPLANT
GOWN STRL REUS W/ TWL LRG LVL3 (GOWN DISPOSABLE) ×2 IMPLANT
GOWN STRL REUS W/TWL LRG LVL3 (GOWN DISPOSABLE) ×2
GOWN STRL REUS W/TWL MED LVL3 (GOWN DISPOSABLE) ×4 IMPLANT
HANDPIECE VERSAJET DEBRIDEMENT (MISCELLANEOUS) ×2 IMPLANT
IV NS 1000ML (IV SOLUTION) ×1
IV NS 1000ML BAXH (IV SOLUTION) ×1 IMPLANT
KIT TURNOVER KIT A (KITS) ×2 IMPLANT
LABEL OR SOLS (LABEL) ×2 IMPLANT
NEEDLE FILTER BLUNT 18X 1/2SAF (NEEDLE) ×1
NEEDLE FILTER BLUNT 18X1 1/2 (NEEDLE) ×1 IMPLANT
NEEDLE HYPO 25X1 1.5 SAFETY (NEEDLE) ×2 IMPLANT
NS IRRIG 500ML POUR BTL (IV SOLUTION) ×2 IMPLANT
PACK EXTREMITY ARMC (MISCELLANEOUS) ×2 IMPLANT
PAD ABD DERMACEA PRESS 5X9 (GAUZE/BANDAGES/DRESSINGS) ×2 IMPLANT
PULSAVAC PLUS IRRIG FAN TIP (DISPOSABLE) ×2
RASP SM TEAR CROSS CUT (RASP) IMPLANT
SHIELD FULL FACE ANTIFOG 7M (MISCELLANEOUS) ×2 IMPLANT
SOL .9 NS 3000ML IRR  AL (IV SOLUTION) ×1
SOL .9 NS 3000ML IRR UROMATIC (IV SOLUTION) ×1 IMPLANT
SOL PREP PVP 2OZ (MISCELLANEOUS) ×2
SOLUTION PREP PVP 2OZ (MISCELLANEOUS) ×1 IMPLANT
SPOGE SURGIFLO 8M (HEMOSTASIS) ×1
SPONGE SURGIFLO 8M (HEMOSTASIS) ×1 IMPLANT
STOCKINETTE IMPERVIOUS 9X36 MD (GAUZE/BANDAGES/DRESSINGS) ×2 IMPLANT
SUT ETHILON 2 0 FS 18 (SUTURE) IMPLANT
SUT ETHILON 4-0 (SUTURE) ×1
SUT ETHILON 4-0 FS2 18XMFL BLK (SUTURE) ×1
SUT VIC AB 3-0 SH 27 (SUTURE) ×1
SUT VIC AB 3-0 SH 27X BRD (SUTURE) ×1 IMPLANT
SUT VIC AB 4-0 FS2 27 (SUTURE) ×2 IMPLANT
SUTURE ETHLN 4-0 FS2 18XMF BLK (SUTURE) ×1 IMPLANT
SWAB CULTURE AMIES ANAERIB BLU (MISCELLANEOUS) ×2 IMPLANT
SYR 10ML LL (SYRINGE) ×4 IMPLANT
SYR 3ML LL SCALE MARK (SYRINGE) ×2 IMPLANT
TIP FAN IRRIG PULSAVAC PLUS (DISPOSABLE) ×1 IMPLANT

## 2019-01-17 NOTE — Anesthesia Post-op Follow-up Note (Signed)
Anesthesia QCDR form completed.        

## 2019-01-17 NOTE — Anesthesia Preprocedure Evaluation (Signed)
Anesthesia Evaluation  Patient identified by MRN, date of birth, ID band Patient awake    Reviewed: Allergy & Precautions, NPO status , Patient's Chart, lab work & pertinent test results  History of Anesthesia Complications Negative for: history of anesthetic complications  Airway Mallampati: III       Dental   Pulmonary neg sleep apnea, neg COPD,           Cardiovascular (-) hypertension(-) Past MI and (-) CHF (-) dysrhythmias (-) Valvular Problems/Murmurs     Neuro/Psych neg Seizures    GI/Hepatic Neg liver ROS, neg GERD  ,  Endo/Other  diabetes, Insulin Dependent  Renal/GU negative Renal ROS     Musculoskeletal   Abdominal   Peds  Hematology   Anesthesia Other Findings   Reproductive/Obstetrics                             Anesthesia Physical Anesthesia Plan  ASA: III  Anesthesia Plan: General   Post-op Pain Management:    Induction: Intravenous  PONV Risk Score and Plan: 3 and Dexamethasone, Ondansetron and Midazolam  Airway Management Planned: LMA  Additional Equipment:   Intra-op Plan:   Post-operative Plan:   Informed Consent: I have reviewed the patients History and Physical, chart, labs and discussed the procedure including the risks, benefits and alternatives for the proposed anesthesia with the patient or authorized representative who has indicated his/her understanding and acceptance.       Plan Discussed with:   Anesthesia Plan Comments:         Anesthesia Quick Evaluation

## 2019-01-17 NOTE — Progress Notes (Signed)
Initial Nutrition Assessment  DOCUMENTATION CODES:   Obesity unspecified  INTERVENTION:   -1 packet Juven BID, each packet provides 80 calories, 8 grams of carbohydrate, and 14 grams of amino acids; supplement contains CaHMB, glutamine, and arginine, to promote wound healing -MVI with minerals daily -Provided "Carbohydrate Counting for People with Diabetes" handout from AND's Nutrition Care Manual -Sent referral for outpatient diabetes education to [facility]'s Nutrition and Diabetes Education [employer]  NUTRITION DIAGNOSIS:   Increased nutrient needs related to wound healing as evidenced by estimated needs.  GOAL:   Patient will meet greater than or equal to 90% of their needs  MONITOR:   PO intake, Supplement acceptance, Diet advancement, Labs, Weight trends, Skin, I & O's  REASON FOR ASSESSMENT:   Consult Diet education  ASSESSMENT:   28 year old female with past medical history of diabetes who presents to the hospital due to right foot pain swelling and redness.  Pt admitted with sepsis and rt foot swelling likely related to osteomyelitis vs DM foot ulcer vs septic arthritis.   Pt down in OR from I&F of rt foot at time of visit. No family or caregivers present to provide further nutrition history. Unable to perform nutrition-focused physical exam at this time.   Per chart review, pt has experienced a 2.9% wt loss over the past year, which is not significant for time. Noted distant wt loss about two years ago, which may be contributing to uncontrolled DM.   Pt with increased nutrient needs related to wound healing. RD will add supplements once diet is advanced.   Last Hgb A1 c: 12.3 (01/16/19). PTA DM medications are 500 mg metformin BID; per DM coordinator notes, pt has been on insulin in the past, but stopped taking it.    Labs reviewed: Na: 131, CBGS: 187-351 (inpatient orders for glycemic control are 0-15 units insulin aspart TID with meals, 0-5  units insulin aspart q HS, and 25 units insulin glargine q HS).   NUTRITION - FOCUSED PHYSICAL EXAM:    Most Recent Value  Orbital Region  Unable to assess  Upper Arm Region  Unable to assess  Thoracic and Lumbar Region  Unable to assess  Buccal Region  Unable to assess  Temple Region  Unable to assess  Clavicle Bone Region  Unable to assess  Clavicle and Acromion Bone Region  Unable to assess  Scapular Bone Region  Unable to assess  Dorsal Hand  Unable to assess  Patellar Region  Unable to assess  Anterior Thigh Region  Unable to assess  Posterior Calf Region  Unable to assess  Edema (RD Assessment)  Unable to assess  Hair  Unable to assess  Eyes  Unable to assess  Mouth  Unable to assess  Skin  Unable to assess  Nails  Unable to assess       Diet Order:   Diet Order            Diet NPO time specified Except for: Sips with Meds  Diet effective midnight              EDUCATION NEEDS:   No education needs have been identified at this time  Skin:  Skin Assessment: Skin Integrity Issues: Skin Integrity Issues:: Diabetic Ulcer Diabetic Ulcer: rt foot with eschar  Last BM:  PTA  Height:   Ht Readings from Last 1 Encounters:  01/16/19 6\' 3"  (1.905 m)    Weight:   Wt Readings from Last 1 Encounters:  01/16/19 119  kg    Ideal Body Weight:  80.9 kg  BMI:  Body mass index is 32.79 kg/m.  Estimated Nutritional Needs:   Kcal:  2200-2400  Protein:  115-130 grams  Fluid:  > 2.2 L    Ariani Seier A. Mayford Knife, RD, LDN, CDE Pager: [phone] After hours Pager: [phone]

## 2019-01-17 NOTE — Progress Notes (Addendum)
Inpatient Diabetes Program Recommendations  AACE/ADA: New Consensus Statement on Inpatient Glycemic Control   Target Ranges:  Prepandial:   less than 140 mg/dL      Peak postprandial:   less than 180 mg/dL (1-2 hours)      Critically ill patients:  140 - 180 mg/dL  Results for Katie, Gilbert (MRN [id number]) as of 01/17/2019 08:20  Ref. Range 01/16/2019 16:27 01/16/2019 21:34 01/17/2019 07:44  Glucose-Capillary Latest Ref Range: 70 - 99 mg/dL 285 (H) 276 (H) 351 (H)   Results for Katie, Gilbert (MRN [id number]) as of 01/16/2019 15:41  Ref. Range 01/16/2019 12:47  Glucose Latest Ref Range: 70 - 99 mg/dL 355 (H)   Results for Katie, Gilbert (MRN [id number]) as of 01/17/2019 08:20  Ref. Range 06/13/2018 23:31 01/16/2019 12:47  Hemoglobin A1C Latest Ref Range: 4.8 - 5.6 % 13.0 (H) 12.3 (H)   Review of Glycemic Control  Diabetes history: DM2 Outpatient Diabetes medications: None listed on home med list but noted Metformin 500 mg BID (per ED MD note) Current orders for Inpatient glycemic control: Lantus 18 units QHS, Novolog 0-15 units TID with meals, Novolog 0-5 units QHS  Inpatient Diabetes Program Recommendations:   Insulin - Basal: Please consider increasing Lantus to 25 units QHS.  Also please order one time Lantus 5 units x1 now.    Correction (SSI): Please consider changing frequency of CBGs and Novolog 0-15 units to Q4H (for more frequent monitoring and correction if needed).  HgbA1C: A1C 12.3% on 01/16/19 indicating an average glucose of 306 mg/dl over the past 2-3 months.  Recommend patient be discharged on insulin regimen for DM control.  NOTE: Noted consult for Diabetes Coordinator. Chart reviewed.  Initial glucose 355 mg/dl on labs. In reviewing chart, noted patient has been on insulin in the past but stopped taking it.  Anticipate patient needs to be taking insulin consistently as an outpatient. Noted patient is NPO and to have procedure today. Will plan to talk with patient  today regarding DM control and importance of improving control.  Addendum 01/17/19_0 :30-Spoke with patient about diabetes and home regimen for diabetes control. Patient reports that she does NOT have a PCP but notes that she has private insurance but she is unsure what is covered. Patient reports that she has taken both oral DM medications and insulin in the past but she has not taken anything for DM in a long time.  Patient reports that she does not monitor glucose at all and she does not have a glucometer.  Informed patient it would be requested that she be given a prescription for a new glucometer and testing supplies. However, if insurance does not cover she could purchase Reli-On Prime glucometer at [facility] for $9 and a box of 50 test strips for $9 as well.   Discussed A1C results (12.3% on 01/16/19) and explained that current A1C indicates an average glucose of 306 mg/dl over the past 2-3 months. Discussed glucose and A1C goals. Discussed importance of checking CBGs and maintaining good CBG control to prevent long-term and short-term complications. Explained how hyperglycemia leads to damage within blood vessels which lead to the common complications seen with uncontrolled diabetes. Stressed to the patient the importance of improving glycemic control to prevent further complications from uncontrolled diabetes. Stressed importance of glycemic control following surgery and for wound healing.  Discussed impact of nutrition, exercise, stress, sickness, and medications on diabetes control. Patient states that she doesn't really have a good understanding of a Carb  Modified diet.  Discussed carbohydrates, carbohydrate goals per day and meal, along with portion sizes. Encouraged patient to look at nutrition labels and try to limit carbohydrates to 50-75 grams of carbohydrates per meal. Patient would like to talk with RD while inpatient so RD consult ordered. Patient reports that she has used both vial/syringe and  insulin pens in the past but she is not sure which insulins she has been on.  Discussed Lantus, Levemir, Novolog, and Humalog insulin. Also discussed more affordable insulins such as Novolin Regular, Novolin NPH, and Novolin 70/30 which can be purchased at [facility] for $25 per vial.  Asked patient to call her insurance and find out if insulins are covered and what her copays will be to ensure she can afford them.  Also asked patient to let nursing staff know so that the right insulins could be prescribed at discharge. Patient is agreeable to take insulin as an outpatient and she states that she plans to do a better job at taking care of herself and improving DM control.  Reviewed and demonstrated how to use an insulin pen and vial and syringe for insulin injections. Patient was able to provide return demonstration of both the insulin pen and the vial/syringe.  Informed patient that an insulin starter kit would be ordered as well for her to refer back to for directions on both.  Encouraged patient to check glucose 3-4 times per day (before meals and at bedtime) and to keep a log book of glucose readings and insulin taken which she will need to take to doctor appointments. Explained how the doctor she follows up with can use the log book to continue to make insulin adjustments if needed. Informed patient that CM consult would be ordered as well to assist with arranging follow up care and perhaps medication assistance if needed.  Patient verbalized understanding of information discussed and she states that she has no further questions at this time related to diabetes.  Thanks, Barnie Alderman, RN, MSN, CDE Diabetes Coordinator Inpatient Diabetes Program [phone] (Team Pager from 8am to 5pm)

## 2019-01-17 NOTE — Progress Notes (Signed)
[employer] [facility] [facility] [place] [facility]   PATIENT NAME: Katie Gilbert    MR#:  [id number]  DATE OF BIRTH:  06/05/1991  SUBJECTIVE:  CHIEF COMPLAINT:   Chief Complaint  Patient presents with  . Foot Swelling    right  no complaints other than foot pain REVIEW OF SYSTEMS:  Review of Systems  Constitutional: Negative for diaphoresis, fever, malaise/fatigue and weight loss.  HENT: Negative for ear discharge, ear pain, hearing loss, nosebleeds, sore throat and tinnitus.   Eyes: Negative for blurred vision and pain.  Respiratory: Negative for cough, hemoptysis, shortness of breath and wheezing.   Cardiovascular: Negative for chest pain, palpitations, orthopnea and leg swelling.  Gastrointestinal: Negative for abdominal pain, blood in stool, constipation, diarrhea, heartburn, nausea and vomiting.  Genitourinary: Negative for dysuria, frequency and urgency.  Musculoskeletal: Negative for back pain and myalgias.  Skin: Negative for itching and rash.  Neurological: Negative for dizziness, tingling, tremors, focal weakness, seizures, weakness and headaches.  Psychiatric/Behavioral: Negative for depression. The patient is not nervous/anxious.     DRUG ALLERGIES:  No Known Allergies VITALS:  Blood pressure (!) 106/55, pulse 98, temperature 97.6 F (36.4 C), resp. rate 11, height 6\' 3"  (1.905 m), weight 119 kg, last menstrual period 01/01/2019, SpO2 100 %. PHYSICAL EXAMINATION:  Physical Exam HENT:     Head: Normocephalic and atraumatic.  Eyes:     Conjunctiva/sclera: Conjunctivae normal.     Pupils: Pupils are equal, round, and reactive to light.  Neck:     Musculoskeletal: Normal range of motion and neck supple.     Thyroid: No thyromegaly.     Trachea: No tracheal deviation.  Cardiovascular:     Rate and Rhythm: Normal rate and regular rhythm.     Heart sounds: Normal heart sounds.  Pulmonary:     Effort: Pulmonary effort is normal. No respiratory distress.   Breath sounds: Normal breath sounds. No wheezing.  Chest:     Chest wall: No tenderness.  Abdominal:     General: Bowel sounds are normal. There is no distension.     Palpations: Abdomen is soft.     Tenderness: There is no abdominal tenderness.  Musculoskeletal: Normal range of motion.       Feet:  Feet:     Comments: In dressing Skin:    General: Skin is warm and dry.     Findings: No rash.  Neurological:     Mental Status: She is alert and oriented to person, place, and time.     Cranial Nerves: No cranial nerve deficit.    LABORATORY PANEL:  Female CBC Recent Labs  Lab 01/17/19 0534  WBC 13.4*  HGB 9.6*  HCT 29.5*  PLT 354   ------------------------------------------------------------------------------------------------------------------ Chemistries  Recent Labs  Lab 01/16/19 1247 01/17/19 0534  NA 129* 131*  K 3.6 3.5  CL 95* 97*  CO2 22 25  GLUCOSE 355* 376*  BUN 8 11  CREATININE 0.60 0.75  CALCIUM 8.6* 7.7*  AST 12*  --   ALT 11  --   ALKPHOS 110  --   BILITOT 0.9  --    RADIOLOGY:  Mr Foot Right Wo Contrast  Result Date: 01/16/2019 CLINICAL DATA:  Osteomyelitis. EXAM: MRI OF THE RIGHT FOREFOOT WITHOUT CONTRAST TECHNIQUE: Multiplanar, multisequence MR imaging of the right foot was performed. No intravenous contrast was administered. COMPARISON:  Radiographs 01/16/2019 FINDINGS: As demonstrated on the plain films there is extensive osteomyelitis involving the proximal phalanx of the fifth  toe. The bone is moderately destroyed. The PIP joint is destroyed suggesting septic arthritis. The middle phalanx is also involved with osteomyelitis. Is difficult to see the distal phalanx and there may be 1 combined distal and middle phalanx. There is also evidence of osteomyelitis involving the fifth metatarsal head and distal fifth metatarsal shaft with findings worrisome for septic arthritis at the fifth MTP joint. There are also changes of septic arthritis at the fourth  MTP joint with osteomyelitis involving the fourth metatarsal head and the fourth proximal phalanx. There is extensive cellulitis and myofasciitis involving the entire foot. Fluid collection along the dorsum of the fifth proximal phalanx is most likely an abscess which also contains some gas. This measures approximately 2.2 x 1.7 cm. No findings to suggest osteomyelitis involving the first, second or third toes. There appears to be a plantar soft tissue wound involving the great toe along the level of the distal phalanx but I do not see any obvious changes of osteomyelitis. IMPRESSION: 1. MR findings consistent with septic arthritis involving the PIP joint and MTP joint of the fifth toe with osteomyelitis involving the middle and proximal phalanges and also the distal fifth metatarsal shaft and fifth metatarsal head. 2. Septic arthritis likely at the fourth MTP joint with osteomyelitis involving the fourth metatarsal head, distal shaft and also the proximal phalanx. 3. Extensive cellulitis and myofasciitis. 4. Dorsal soft tissue abscess overlying the fifth proximal phalanx and fifth MTP joint. Electronically Signed   By: Rudie Meyer M.D.   On: 01/16/2019 18:33   ASSESSMENT AND PLAN:  28 year old female with past medical history of diabetes who presents to the hospital due to right foot pain swelling and redness.  1.  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 also Incision and drainage deep webspace abscess right forefoot by Dr Ether Griffins today  2.  Sepsis present on admission-secondary to right foot osteomyelitis/diabetic foot ulcer.   -Continue IV fluids, IV antibiotics as mentioned above.  Follow cultures.    3.  Leukocytosis-secondary to the infection -Follow white cell count with IV antibiotic therapy.    4.  Diabetes type 1-patient is noncompliant has not taken  the medications for a long time. - A1C 12.3% on 01/16/19  - increase lantus to 25 units SQ qhs - continue SSI      All the records are reviewed and case discussed with Care Management/Social Worker. Management plans discussed with the patient, Dr Ether Griffins and they are in agreement.  CODE STATUS: Full Code  TOTAL TIME TAKING CARE OF THIS PATIENT: 35 minutes.   More than 50% of the time was spent in counseling/coordination of care: YES  POSSIBLE D/C IN 2-3 DAYS, DEPENDING ON CLINICAL CONDITION.   Delfino Lovett M.D on 01/17/2019 at 2:35 PM  Between 7am to 6pm - Pager - [phone]  After 6pm go to [email or web address] - Social research officer, government  [facility] [place] [facility]  Office  [phone]  CC: Primary care physician; Patient, No Pcp Per  Note: This dictation was prepared with Dragon dictation along with smaller phrase technology. Any transcriptional errors that result from this process are unintentional.

## 2019-01-17 NOTE — Anesthesia Postprocedure Evaluation (Signed)
Anesthesia Post Note  Patient: Katie Gilbert  Procedure(s) Performed: IRRIGATION AND DEBRIDEMENT RIGHT FOOT (Right )  Patient location during evaluation: PACU Anesthesia Type: General Level of consciousness: awake and alert Pain management: pain level controlled Vital Signs Assessment: post-procedure vital signs reviewed and stable Respiratory status: spontaneous breathing and respiratory function stable Cardiovascular status: stable Anesthetic complications: no     Last Vitals:  Vitals:   01/17/19 1415 01/17/19 1430  BP: (!) 106/55 111/76  Pulse: 98 95  Resp: 11 17  Temp: 36.4 C   SpO2: 100% 97%    Last Pain:  Vitals:   01/17/19 1430  TempSrc:   PainSc: 0-No pain                 KEPHART,WILLIAM K

## 2019-01-17 NOTE — Transfer of Care (Signed)
Immediate Anesthesia Transfer of Care Note  Patient: Katie Gilbert  Procedure(s) Performed: IRRIGATION AND DEBRIDEMENT RIGHT FOOT (Right )  Patient Location: PACU  Anesthesia Type:General  Level of Consciousness: awake  Airway & Oxygen Therapy: Patient Spontanous Breathing  Post-op Assessment: Report given to RN  Post vital signs: stable  Last Vitals:  Vitals Value Taken Time  BP 106/55 01/17/2019  2:15 PM  Temp    Pulse 95 01/17/2019  2:18 PM  Resp 15 01/17/2019  2:18 PM  SpO2 100 % 01/17/2019  2:18 PM  Vitals shown include unvalidated device data.  Last Pain:  Vitals:   01/17/19 1229  TempSrc: Temporal  PainSc: 0-No pain      Patients Stated Pain Goal: 4 (01/16/19 2018)  Complications: No apparent anesthesia complications

## 2019-01-17 NOTE — Anesthesia Procedure Notes (Signed)
Procedure Name: LMA Insertion Date/Time: 01/17/2019 1:08 PM Performed by: Oliva Bustardorbett, Ora Mcnatt E, CRNA Pre-anesthesia Checklist: Patient identified, Emergency Drugs available, Suction available, Patient being monitored and Timeout performed Patient Re-evaluated:Patient Re-evaluated prior to induction Oxygen Delivery Method: Circle system utilized Preoxygenation: Pre-oxygenation with 100% oxygen Induction Type: IV induction Ventilation: Mask ventilation without difficulty LMA: LMA inserted LMA Size: 4.5 Number of attempts: 1 Placement Confirmation: positive ETCO2 Dental Injury: Teeth and Oropharynx as per pre-operative assessment

## 2019-01-17 NOTE — Progress Notes (Signed)
Febrile overnight 100.5. WNL following tylenol administration.

## 2019-01-17 NOTE — Op Note (Signed)
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  Partial amputation fifth ray right foot 2.  Partial amputation fourth ray right foot 3.  Incision and drainage deep webspace abscess right forefoot    EBL: Minimal    Anesthesia:local and general.  Local consisted of 0.5% bupivacaine.  A total of 20 cc was used.    Hemostasis: Mid calf tourniquet inflated to 200 mmHg for approximately 22 minutes    Specimen: Deep wound culture as well as fourth and fifth toes and metatarsals for pathology    Complications: None    Operative indications:Katie Gilbert is an 28 y.o. that presents today for surgical intervention.  The risks/benefits/alternatives/complications have been discussed and consent has been given.    Procedure:  Patient was brought into the OR and placed on the operating table in thesupine position. After anesthesia was obtained theright lower extremity was prepped and draped in usual sterile fashion.  Attention was directed to the distal right foot where a noted purulent drainage was expressed from the fourth webspace with necrotic fifth toe and early necrosis of the fourth toe.  Previous MRI revealed osteomyelitis of the fourth and fifth metatarsal phalangeal joint regions.  A incision was begun on the lateral fifth metatarsal and incorporated around the fourth and fifth metatarsophalangeal joints dorsally and plantarly.  Full-thickness incision was taken down to the metatarsals.  Soft tissue was sharply removed off the dorsal and plantar aspect of the metatarsals.  An osteotomy was created along the midshaft region of the fourth and fifth metatarsals at this time.  The fourth ray and fifth rays were then removed.  The proximal margins were inked and sent for pathological examination.  Deep space culture was performed of the infected  material.  The wound was flushed with copious amounts of irrigation.  At this time blunt dissection was carried out under the lesser metatarsophalangeal joints.  A deep space abscess was noted and drained at this time.  This area was irrigated and flushed with copious amounts of irrigation.  Next the versa jet was used to remove any residual nonviable tissue both into the amputated site as well as into the deep space abscess under the left metatarsal phalangeal joints.  Irrigation the tourniquet was deflated.  Bleeders were Bovie cauterized as appropriate.  The area was infiltrated with Surgi-Flo with thrombin.  The skin incision was reapproximated with a 2-0 nylon.  The deep space abscess region as well as the amputated sites were packed with iodoform impregnated gauze.  A large bulky sterile dressing was applied to the right foot.    Patient tolerated the procedure and anesthesia well.  Was transported from the OR to the PACU with all vital signs stable and vascular status intact. To be discharged per routine protocol.

## 2019-01-18 LAB — CBC
HCT: 27.5 % — ABNORMAL LOW (ref 36.0–46.0)
Hemoglobin: 8.8 g/dL — ABNORMAL LOW (ref 12.0–15.0)
MCH: 27.2 pg (ref 26.0–34.0)
MCHC: 32 g/dL (ref 30.0–36.0)
MCV: 85.1 fL (ref 80.0–100.0)
Platelets: 323 10*3/uL (ref 150–400)
RBC: 3.23 MIL/uL — ABNORMAL LOW (ref 3.87–5.11)
RDW: 11.9 % (ref 11.5–15.5)
WBC: 11.8 10*3/uL — AB (ref 4.0–10.5)
nRBC: 0 % (ref 0.0–0.2)

## 2019-01-18 LAB — VANCOMYCIN, TROUGH: Vancomycin Tr: 11 ug/mL — ABNORMAL LOW (ref 15–20)

## 2019-01-18 LAB — BASIC METABOLIC PANEL
Anion gap: 6 (ref 5–15)
BUN: 12 mg/dL (ref 6–20)
CO2: 26 mmol/L (ref 22–32)
Calcium: 7.6 mg/dL — ABNORMAL LOW (ref 8.9–10.3)
Chloride: 104 mmol/L (ref 98–111)
Creatinine, Ser: 0.86 mg/dL (ref 0.44–1.00)
GFR calc non Af Amer: >60 mL/min (ref >60)
Glucose, Bld: 228 mg/dL — ABNORMAL HIGH (ref 70–99)
Potassium: 3.3 mmol/L — ABNORMAL LOW (ref 3.5–5.1)
SODIUM: 136 mmol/L (ref 135–145)

## 2019-01-18 LAB — GLUCOSE, CAPILLARY
Glucose-Capillary: 186 mg/dL — ABNORMAL HIGH (ref 70–99)
Glucose-Capillary: 182 mg/dL — ABNORMAL HIGH (ref 70–99)
Glucose-Capillary: 160 mg/dL — ABNORMAL HIGH (ref 70–99)
Glucose-Capillary: 142 mg/dL — ABNORMAL HIGH (ref 70–99)
Glucose-Capillary: 178 mg/dL — ABNORMAL HIGH (ref 70–99)

## 2019-01-18 LAB — VANCOMYCIN, PEAK: Vancomycin Pk: 21 ug/mL — ABNORMAL LOW (ref 30–40)

## 2019-01-18 NOTE — Progress Notes (Signed)
Pharmacy Antibiotic Note  Katie Gilbert is a 28 y.o. female admitted on 01/16/2019 with wound infection of foot/?osteomyelitis.  Pharmacy has been consulted for Vancomycin and Zosyn dosing. Hx DM-noncompliant, foul smelling drainage of R foot Xray: ?osteomyelitis/deep tissue infection   Plan:  Patient received Vanc 1 gram x1  in ER and 1500mg  x 1 on floor for a total Loading dose of 2500 mg based on weight of 122.5 kg.  Will start Vancomycin 1250 mg IV q8h for goal AUC 400-550 Expected AUC is 534 BMI 33 Scr used: 0.8  2/1: Peak at 0224 = 21, trough at 0726 = 11 Per vanc AUC calculator - steady state AUC at current dose and interval = 408.3 with Cmin expected 10.7 Will continue at current dose. SCr also slowly creeping up. Will check SCr in AM as pt on concomitant Zosyn  Continue Zosyn 3.375 g EI q8h. Patient received first dose on floor instead of in ER. Watch renal fxn on Zosyn/Vancomycin    Height: 6\' 3"  (190.5 cm) Weight: 262 lb 5.6 oz (119 kg) IBW/kg (Calculated) : 80  Temp (24hrs), Avg:98.8 F (37.1 C), Min:97.1 F (36.2 C), Max:102.3 F (39.1 C)  Recent Labs  Lab 01/16/19 1247 01/16/19 1248 01/16/19 1613 01/17/19 0534 01/18/19 0224 01/18/19 0726  WBC 16.9*  --   --  13.4* 11.8*  --   CREATININE 0.60  --   --  0.75 0.86  --   LATICACIDVEN  --  1.4 1.1  --   --   --   VANCOTROUGH  --   --   --   --   --  11*  VANCOPEAK  --   --   --   --  21*  --     Estimated Creatinine Clearance: 148.3 mL/min (by C-G formula based on SCr of 0.86 mg/dL).    No Known Allergies  Antimicrobials this admission: Vanc 1/30 >>   Zosyn 1/30 >>    Dose adjustments this admission:    Microbiology results: 1/31 WCx pending  Thank you for allowing pharmacy to be a part of this patient's care.  Marty Heck 01/18/2019 8:08 AM

## 2019-01-18 NOTE — Progress Notes (Signed)
[facility] [place] [facility]   PATIENT NAME: Katie Gilbert    MR#:  [id number]  DATE OF BIRTH:  Sep 25, 1991  SUBJECTIVE:  CHIEF COMPLAINT:   Chief Complaint  Patient presents with  . Foot Swelling    right  no complaints other than foot pain  REVIEW OF SYSTEMS:  Review of Systems  Constitutional: Negative for diaphoresis, fever, malaise/fatigue and weight loss.  HENT: Negative for ear discharge, ear pain, hearing loss, nosebleeds, sore throat and tinnitus.   Eyes: Negative for blurred vision and pain.  Respiratory: Negative for cough, hemoptysis, shortness of breath and wheezing.   Cardiovascular: Negative for chest pain, palpitations, orthopnea and leg swelling.  Gastrointestinal: Negative for abdominal pain, blood in stool, constipation, diarrhea, heartburn, nausea and vomiting.  Genitourinary: Negative for dysuria, frequency and urgency.  Musculoskeletal: Negative for back pain and myalgias.  Skin: Negative for itching and rash.  Neurological: Negative for dizziness, tingling, tremors, focal weakness, seizures, weakness and headaches.  Psychiatric/Behavioral: Negative for depression. The patient is not nervous/anxious.     DRUG ALLERGIES:  No Known Allergies VITALS:  Blood pressure 109/71, pulse 95, temperature 98.7 F (37.1 C), temperature source Oral, resp. rate 20, height 6\' 3"  (1.905 m), weight 119 kg, last menstrual period 01/01/2019, SpO2 98 %. PHYSICAL EXAMINATION:  Physical Exam HENT:     Head: Normocephalic and atraumatic.  Eyes:     Conjunctiva/sclera: Conjunctivae normal.     Pupils: Pupils are equal, round, and reactive to light.  Neck:     Musculoskeletal: Normal range of motion and neck supple.     Thyroid: No thyromegaly.     Trachea: No tracheal deviation.  Cardiovascular:     Rate and Rhythm: Normal rate and regular rhythm.     Heart sounds: Normal heart sounds.  Pulmonary:     Effort: Pulmonary effort is normal. No  respiratory distress.     Breath sounds: Normal breath sounds. No wheezing.  Chest:     Chest wall: No tenderness.  Abdominal:     General: Bowel sounds are normal. There is no distension.     Palpations: Abdomen is soft.     Tenderness: There is no abdominal tenderness.  Musculoskeletal: Normal range of motion.       Feet:  Feet:     Comments: In dressing Skin:    General: Skin is warm and dry.     Findings: No rash.  Neurological:     Mental Status: Katie Gilbert is alert and oriented to person, place, and time.     Cranial Nerves: No cranial nerve deficit.    LABORATORY PANEL:  Female CBC Recent Labs  Lab 01/18/19 0224  WBC 11.8*  HGB 8.8*  HCT 27.5*  PLT 323   ------------------------------------------------------------------------------------------------------------------ Chemistries  Recent Labs  Lab 01/16/19 1247  01/18/19 0224  NA 129*   < > 136  K 3.6   < > 3.3*  CL 95*   < > 104  CO2 22   < > 26  GLUCOSE 355*   < > 228*  BUN 8   < > 12  CREATININE 0.60   < > 0.86  CALCIUM 8.6*   < > 7.6*  AST 12*  --   --   ALT 11  --   --   ALKPHOS 110  --   --   BILITOT 0.9  --   --    < > = values in this interval not displayed.  RADIOLOGY:  No results found. ASSESSMENT AND PLAN:  28 year old female with past medical history of diabetes who presents to the hospital due to right foot pain swelling and redness.  1.  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 also Incision and drainage deep webspace abscess right forefoot by Dr Ether GriffinsFowler 01/17/19 Suggested to call ID consult to decide type and length of antibiotic therapy  2.  Sepsis present on admission-secondary to right foot osteomyelitis/diabetic foot ulcer.   -Continue IV fluids, IV antibiotics as mentioned above.  Follow cultures.    3.  Leukocytosis-secondary to the  infection -Follow white cell count with IV antibiotic therapy.    4.  Diabetes type 1-patient is noncompliant has not taken the medications for a long time. - A1C 12.3% on 01/16/19  - increase lantus to 25 units SQ qhs - continue SSI    All the records are reviewed and case discussed with Care Management/Social Worker. Management plans discussed with the patient, Dr Ether GriffinsFowler and they are in agreement.  CODE STATUS: Full Code  TOTAL TIME TAKING CARE OF THIS PATIENT: 35 minutes.   More than 50% of the time was spent in counseling/coordination of care: YES  POSSIBLE D/C IN 2-3 DAYS, DEPENDING ON CLINICAL CONDITION.   Altamese DillingVaibhavkumar Maddisen Vought M.D on 01/18/2019 at 2:48 PM  Between 7am to 6pm - Pager - [phone]  After 6pm go to [email or web address] - Social research officer, governmentpassword [email or web address]  [facility]  Office  [phone]  CC: Primary care physician; Patient, No Pcp Per  Note: This dictation was prepared with Dragon dictation along with smaller phrase technology. Any transcriptional errors that result from this process are unintentional.

## 2019-01-18 NOTE — Progress Notes (Signed)
Daily Progress Note   Subjective  - 1 Day Post-Op  Follow-up right foot incision and drainage with amputation of fourth and fifth toes.  Minimal pain.  Objective Vitals:   01/17/19 1647 01/17/19 2327 01/18/19 0054 01/18/19 0055  BP: 104/66 101/67 (!) 87/67 106/68  Pulse: 92 (!) 105 95 96  Resp: 18 19 20    Temp: 98.2 F (36.8 C) (!) 102.3 F (39.1 C) 100.2 F (37.9 C)   TempSrc: Oral Oral Oral   SpO2: 100% 96% 99% 98%  Weight:      Height:        Physical Exam: Incision is coapted nicely with distal aspect packed open.  Packing pulled and no purulent drainage.  Erythema and edema has diminished nicely.  Laboratory CBC    Component Value Date/Time   WBC 11.8 (H) 01/18/2019 0224   HGB 8.8 (L) 01/18/2019 0224   HGB 13.2 01/22/2015 1537   HCT 27.5 (L) 01/18/2019 0224   HCT 39.5 01/22/2015 1537   PLT 323 01/18/2019 0224   PLT 357 01/22/2015 1537    BMET    Component Value Date/Time   NA 136 01/18/2019 0224   NA 136 01/24/2015 0450   K 3.3 (L) 01/18/2019 0224   K 4.0 01/22/2015 1537   CL 104 01/18/2019 0224   CL 99 01/22/2015 1537   CO2 26 01/18/2019 0224   CO2 25 01/22/2015 1537   GLUCOSE 228 (H) 01/18/2019 0224   GLUCOSE 403 (H) 01/22/2015 1537   BUN 12 01/18/2019 0224   BUN 5 (L) 01/22/2015 1537   CREATININE 0.86 01/18/2019 0224   CREATININE 0.72 01/25/2015 1336   CALCIUM 7.6 (L) 01/18/2019 0224   CALCIUM 9.1 01/22/2015 1537   GFRNONAA >60 01/18/2019 0224   GFRNONAA >60 01/25/2015 1336   GFRNONAA >60 07/15/2014 1806   GFRAA >60 01/18/2019 0224   GFRAA >60 01/25/2015 1336   GFRAA >60 07/15/2014 1806    Assessment/Planning: Status post right fourth and fifth toe amputation for osteomyelitis   White blood cell count is trending down.  Awaiting cultures.  Dressing changed today.  Will have physical therapy evaluate for nonweightbearing.  ID has been consulted for IV antibiotics.    Gwyneth Revels A  01/18/2019, 7:38 AM

## 2019-01-18 NOTE — Progress Notes (Signed)
PT Cancellation Note  Patient Details Name: Katie Gilbert MRN: [id number] DOB: [birth year]   Cancelled Treatment:        Patient reports she just went into bathroom and washed up with nursing. Reports she was able to maintain R NWB. Will perform PT eval tomorrow and assess safety with B Crutches for discharge home.   Phyllis Whitefield 01/18/2019, 1:10 PM

## 2019-01-19 LAB — URINE CULTURE

## 2019-01-19 LAB — GLUCOSE, CAPILLARY
GLUCOSE-CAPILLARY: 110 mg/dL — AB (ref 70–99)
Glucose-Capillary: 146 mg/dL — ABNORMAL HIGH (ref 70–99)
Glucose-Capillary: 129 mg/dL — ABNORMAL HIGH (ref 70–99)
Glucose-Capillary: 85 mg/dL (ref 70–99)

## 2019-01-19 LAB — CREATININE, SERUM
Creatinine, Ser: 0.69 mg/dL (ref 0.44–1.00)
GFR calc Af Amer: >60 mL/min (ref >60)
GFR calc non Af Amer: >60 mL/min (ref >60)

## 2019-01-19 MED ORDER — POTASSIUM CHLORIDE CRYS ER 20 MEQ PO TBCR
20.0000 meq | EXTENDED_RELEASE_TABLET | Freq: Two times a day (BID) | ORAL | Status: DC
  Administered 2019-01-19 – 2019-01-21 (×5): 20 meq via ORAL
  Filled 2019-01-19 (×4): qty 1

## 2019-01-19 NOTE — Evaluation (Signed)
Physical Therapy Evaluation Patient Details Name: Katie Gilbert MRN: [id number] DOB: 1991-04-05 Today's Date: 01/19/2019   History of Present Illness  Pt is a 28 year old with PMH of DM with sensation deficits in bilat foot sensation. Patient arrived to ED with complaints of R foot swelling and drainage. Noted to have osteomylitis in 4th and 5th metatarsals with subsequent partial amputation 01/18/19. Currently NWB on RLE, plans to return home with mother.   Clinical Impression  Patient is a 28 year old female with current impairments in bilat foot sensation, BLE strength, balance, and activity tolerance. Currently limited in activities for transfers STS, ambulation, and that require LLE SLS; inhibiting full participation in her job at [employer] and independent ADLs. PT educated patient on NWB precautions and hop-to gait pattern with RW, will progress to crutches as allowed- difficulty today d/t difficulty with NWB status compliance and decreased LLE balance. Would benefit from skilled PT to address above deficits and promote optimal return to PLOF     Follow Up Recommendations Home health PT    Equipment Recommendations  Rolling walker with 5" wheels    Recommendations for Other Services       Precautions / Restrictions Restrictions Weight Bearing Restrictions: Yes RLE Weight Bearing: Non weight bearing      Mobility  Bed Mobility Overal bed mobility: Modified Independent;Independent                Transfers Overall transfer level: Modified independent Equipment used: Rolling walker (2 wheeled) Transfers: Sit to/from Stand Sit to Stand: Mod assist         General transfer comment: Cuing for set up and hand placement, patient unable to stand abiding by wt bearing precautions without physical assistance  Ambulation/Gait   Gait Distance (Feet): 60 Feet Assistive device: Rolling walker (2 wheeled) Gait Pattern/deviations: (Hop-to) Gait velocity: decreased   General  Gait Details: Patient with difficulty abiding by precautions at first, able to comply and complete hop-to pattern with quick fatigue. PT held crutch training to date as patient was having difficulty with abiding by precautions and with LLE balance  Stairs            Wheelchair Mobility    Modified Rankin (Stroke Patients Only)       Balance Overall balance assessment: Independent;Needs assistance Sitting-balance support: No upper extremity supported;Feet unsupported Sitting balance-Leahy Scale: Normal       Standing balance-Leahy Scale: Fair Standing balance comment: Abiding by NWB precautions with "heel down" on RLE unable to tolerate challenge   Single Leg Stance - Left Leg: 5                         Pertinent Vitals/Pain Pain Assessment: No/denies pain    Home Living                        Prior Function                 Hand Dominance        Extremity/Trunk Assessment   Upper Extremity Assessment Upper Extremity Assessment: Overall WFL for tasks assessed    Lower Extremity Assessment Lower Extremity Assessment: Overall WFL for tasks assessed    Cervical / Trunk Assessment Cervical / Trunk Assessment: Normal  Communication      Cognition Arousal/Alertness: Awake/alert Behavior During Therapy: WFL for tasks assessed/performed Overall Cognitive Status: Within Functional Limits for tasks assessed  General Comments      Exercises     Assessment/Plan    PT Assessment Patient needs continued PT services  PT Problem List Decreased strength;Decreased coordination;Impaired sensation;Decreased activity tolerance;Decreased balance;Decreased mobility;Decreased safety awareness       PT Treatment Interventions Gait training;Therapeutic exercise;Balance training;Therapeutic activities;Patient/family education;Neuromuscular re-education    PT Goals (Current goals can be found  in the Care Plan section)  Acute Rehab PT Goals Patient Stated Goal: Return home, increase walking PT Goal Formulation: With patient Time For Goal Achievement: 02/02/19 Potential to Achieve Goals: Good    Frequency Min 2X/week   Barriers to discharge        Co-evaluation               AM-PAC PT "6 Clicks" Mobility  Outcome Measure Help needed turning from your back to your side while in a flat bed without using bedrails?: None Help needed moving from lying on your back to sitting on the side of a flat bed without using bedrails?: None Help needed moving to and from a bed to a chair (including a wheelchair)?: None Help needed standing up from a chair using your arms (e.g., wheelchair or bedside chair)?: A Lot Help needed to walk in hospital room?: A Little Help needed climbing 3-5 steps with a railing? : A Lot 6 Click Score: 19    End of Session Equipment Utilized During Treatment: Gait belt Activity Tolerance: Patient tolerated treatment well;Patient limited by fatigue Patient left: in bed;with call bell/phone within reach;with bed alarm set(On EOB with wash pain in front of patient) Nurse Communication: Mobility status;Weight bearing status;Precautions PT Visit Diagnosis: Unsteadiness on feet (R26.81);Other abnormalities of gait and mobility (R26.89);Difficulty in walking, not elsewhere classified (R26.2);Muscle weakness (generalized) (M62.81)    Time: 9758-8325 PT Time Calculation (min) (ACUTE ONLY): 30 min   Charges:   PT Evaluation $PT Eval Moderate Complexity: 1 Mod PT Treatments $Therapeutic Activity: 8-22 mins        Staci Acosta PT, DPT  Staci Acosta 01/19/2019, 12:41 PM

## 2019-01-19 NOTE — Progress Notes (Signed)
[employer] [facility] - [place] at [facility]   PATIENT NAME: Katie Gilbert    MR#:  [id number]  DATE OF BIRTH:  05-09-1991  SUBJECTIVE:  CHIEF COMPLAINT:   Chief Complaint  Patient presents with  . Foot Swelling    right  no complaints other than foot pain, getting better.  Able to ambulate with help of walker.  Keeping nonweightbearing.  REVIEW OF SYSTEMS:  Review of Systems  Constitutional: Negative for diaphoresis, fever, malaise/fatigue and weight loss.  HENT: Negative for ear discharge, ear pain, hearing loss, nosebleeds, sore throat and tinnitus.   Eyes: Negative for blurred vision and pain.  Respiratory: Negative for cough, hemoptysis, shortness of breath and wheezing.   Cardiovascular: Negative for chest pain, palpitations, orthopnea and leg swelling.  Gastrointestinal: Negative for abdominal pain, blood in stool, constipation, diarrhea, heartburn, nausea and vomiting.  Genitourinary: Negative for dysuria, frequency and urgency.  Musculoskeletal: Negative for back pain and myalgias.  Skin: Negative for itching and rash.  Neurological: Negative for dizziness, tingling, tremors, focal weakness, seizures, weakness and headaches.  Psychiatric/Behavioral: Negative for depression. The patient is not nervous/anxious.     DRUG ALLERGIES:  No Known Allergies VITALS:  Blood pressure 118/78, pulse 88, temperature 99.3 F (37.4 C), temperature source Oral, resp. rate 18, height 6\' 3"  (1.905 m), weight 119 kg, last menstrual period 01/01/2019, SpO2 98 %. PHYSICAL EXAMINATION:  Physical Exam HENT:     Head: Normocephalic and atraumatic.  Eyes:     Conjunctiva/sclera: Conjunctivae normal.     Pupils: Pupils are equal, round, and reactive to light.  Neck:     Musculoskeletal: Normal range of motion and neck supple.     Thyroid: No thyromegaly.     Trachea: No tracheal deviation.  Cardiovascular:     Rate and Rhythm: Normal rate and regular rhythm.     Heart sounds:  Normal heart sounds.  Pulmonary:     Effort: Pulmonary effort is normal. No respiratory distress.     Breath sounds: Normal breath sounds. No wheezing.  Chest:     Chest wall: No tenderness.  Abdominal:     General: Bowel sounds are normal. There is no distension.     Palpations: Abdomen is soft.     Tenderness: There is no abdominal tenderness.  Musculoskeletal: Normal range of motion.       Feet:  Feet:     Comments: In dressing Skin:    General: Skin is warm and dry.     Findings: No rash.  Neurological:     Mental Status: She is alert and oriented to person, place, and time.     Cranial Nerves: No cranial nerve deficit.    LABORATORY PANEL:  Female CBC Recent Labs  Lab 01/18/19 0224  WBC 11.8*  HGB 8.8*  HCT 27.5*  PLT 323   ------------------------------------------------------------------------------------------------------------------ Chemistries  Recent Labs  Lab 01/16/19 1247  01/18/19 0224 01/19/19 0337  NA 129*   < > 136  --   K 3.6   < > 3.3*  --   CL 95*   < > 104  --   CO2 22   < > 26  --   GLUCOSE 355*   < > 228*  --   BUN 8   < > 12  --   CREATININE 0.60   < > 0.86 0.69  CALCIUM 8.6*   < > 7.6*  --   AST 12*  --   --   --  ALT 11  --   --   --   ALKPHOS 110  --   --   --   BILITOT 0.9  --   --   --    < > = values in this interval not displayed.   RADIOLOGY:  No results found. ASSESSMENT AND PLAN:  28 year old female with past medical history of diabetes who presents to the hospital due to right foot pain swelling and redness.  1.  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 also Incision and drainage deep webspace abscess right forefoot by Dr Ether Griffins 01/17/19 Suggested to call ID consult to decide type and length of antibiotic therapy  2.  Sepsis present on admission-secondary to right foot  osteomyelitis/diabetic foot ulcer.   -Continue IV fluids, IV antibiotics as mentioned above.  Follow cultures.    3.  Leukocytosis-secondary to the infection -Follow white cell count with IV antibiotic therapy.    4.  Diabetes type 1-patient is noncompliant has not taken the medications for a long time. - A1C 12.3% on 01/16/19  - increase lantus to 25 units SQ qhs - continue SSI, blood sugar fairly under control now.    All the records are reviewed and case discussed with Care Management/Social Worker. Management plans discussed with the patient, Dr Ether Griffins and they are in agreement.  CODE STATUS: Full Code  TOTAL TIME TAKING CARE OF THIS PATIENT: 35 minutes.   More than 50% of the time was spent in counseling/coordination of care: YES  POSSIBLE D/C IN 2-3 DAYS, DEPENDING ON CLINICAL CONDITION.   Altamese Dilling M.D on 01/19/2019 at 12:58 PM  Between 7am to 6pm - Pager - [phone]  After 6pm go to [email or web address] - Social research officer, government  [facility] [place] [facility]  Office  [phone]  CC: Primary care physician; Patient, No Pcp Per  Note: This dictation was prepared with Dragon dictation along with smaller phrase technology. Any transcriptional errors that result from this process are unintentional.

## 2019-01-19 NOTE — Progress Notes (Signed)
Pharmacy Antibiotic Note  Katie Gilbert is a 28 y.o. female admitted on 01/16/2019 with wound infection of foot/?osteomyelitis.  Pharmacy has been consulted for Vancomycin and Zosyn dosing. Hx DM-noncompliant, foul smelling drainage of R foot Xray: ?osteomyelitis/deep tissue infection   Plan:  Patient received Vanc 1 gram x1  in ER and 1500mg  x 1 on floor for a total Loading dose of 2500 mg based on weight of 122.5 kg.  Will start Vancomycin 1250 mg IV q8h for goal AUC 400-550 Expected AUC is 534 BMI 33 Scr used: 0.8  2/1: Peak at 0224 = 21, trough at 0726 = 11 Per vanc AUC calculator - steady state AUC at current dose and interval = 408.3 Will continue at current dose. SCr also slowly creeping up. Will check SCr in AM as pt on concomitant Zosyn  2/2: SCr 0.69 - stable, continue at current dose  Continue Zosyn 3.375 g EI q8h. Patient received first dose on floor instead of in ER. Watch renal fxn on Zosyn/Vancomycin    Height: 6\' 3"  (190.5 cm) Weight: 262 lb 5.6 oz (119 kg) IBW/kg (Calculated) : 80  Temp (24hrs), Avg:99.6 F (37.6 C), Min:98.5 F (36.9 C), Max:100.9 F (38.3 C)  Recent Labs  Lab 01/16/19 1247 01/16/19 1248 01/16/19 1613 01/17/19 0534 01/18/19 0224 01/18/19 0726 01/19/19 0337  WBC 16.9*  --   --  13.4* 11.8*  --   --   CREATININE 0.60  --   --  0.75 0.86  --  0.69  LATICACIDVEN  --  1.4 1.1  --   --   --   --   VANCOTROUGH  --   --   --   --   --  11*  --   VANCOPEAK  --   --   --   --  21*  --   --     Estimated Creatinine Clearance: 159.4 mL/min (by C-G formula based on SCr of 0.69 mg/dL).    No Known Allergies  Antimicrobials this admission: Vanc 1/30 >>   Zosyn 1/30 >>    Dose adjustments this admission:    Microbiology results: 1/31 WCx pending  Thank you for allowing pharmacy to be a part of this patient's care.  Marty Heck 01/19/2019 8:13 AM

## 2019-01-19 NOTE — Progress Notes (Signed)
Daily Progress Note   Subjective  - 2 Days Post-Op  Follow-up right foot imitation.  Doing well.  Objective Vitals:   01/18/19 0752 01/18/19 1625 01/18/19 2356 01/19/19 0737  BP: 109/71 129/86 116/78 118/78  Pulse: 95 96 86 88  Resp:   18   Temp: 98.7 F (37.1 C) (!) 100.9 F (38.3 C) 98.5 F (36.9 C) 99.3 F (37.4 C)  TempSrc: Oral Oral Oral Oral  SpO2: 98% 99% 98% 98%  Weight:      Height:        Physical Exam: Incision come together nicely.  Scant amount of bloody drainage.  No purulence.  Erythema continues to diminish as well.  Culture growing strep agalactiae.  Laboratory CBC    Component Value Date/Time   WBC 11.8 (H) 01/18/2019 0224   HGB 8.8 (L) 01/18/2019 0224   HGB 13.2 01/22/2015 1537   HCT 27.5 (L) 01/18/2019 0224   HCT 39.5 01/22/2015 1537   PLT 323 01/18/2019 0224   PLT 357 01/22/2015 1537    BMET    Component Value Date/Time   NA 136 01/18/2019 0224   NA 136 01/24/2015 0450   K 3.3 (L) 01/18/2019 0224   K 4.0 01/22/2015 1537   CL 104 01/18/2019 0224   CL 99 01/22/2015 1537   CO2 26 01/18/2019 0224   CO2 25 01/22/2015 1537   GLUCOSE 228 (H) 01/18/2019 0224   GLUCOSE 403 (H) 01/22/2015 1537   BUN 12 01/18/2019 0224   BUN 5 (L) 01/22/2015 1537   CREATININE 0.69 01/19/2019 0337   CREATININE 0.72 01/25/2015 1336   CALCIUM 7.6 (L) 01/18/2019 0224   CALCIUM 9.1 01/22/2015 1537   GFRNONAA >60 01/19/2019 0337   GFRNONAA >60 01/25/2015 1336   GFRNONAA >60 07/15/2014 1806   GFRAA >60 01/19/2019 0337   GFRAA >60 01/25/2015 1336   GFRAA >60 07/15/2014 1806    Assessment/Planning: Osteomyelitis right foot status post amputation fourth and fifth rays.   Dressing changed today.  Continue with nonweightbearing to the right foot.  Likely will need IV antibiotics.  Consult has been placed for infectious disease.  Upon discharge patient should have dressing changes performed 3 times Gilbert week.  Dry dressing changes to be performed.  Follow-up  with podiatry in 2 weeks.  Dr. Alberteen Spindle to see tomorrow.    Katie Gilbert  01/19/2019, 9:51 AM

## 2019-01-20 ENCOUNTER — Inpatient Hospital Stay: Payer: No Typology Code available for payment source

## 2019-01-20 ENCOUNTER — Inpatient Hospital Stay: Payer: Self-pay

## 2019-01-20 DIAGNOSIS — L03031 Cellulitis of right toe: Secondary | ICD-10-CM

## 2019-01-20 DIAGNOSIS — Z89421 Acquired absence of other right toe(s): Secondary | ICD-10-CM

## 2019-01-20 DIAGNOSIS — M009 Pyogenic arthritis, unspecified: Secondary | ICD-10-CM

## 2019-01-20 DIAGNOSIS — E1069 Type 1 diabetes mellitus with other specified complication: Secondary | ICD-10-CM

## 2019-01-20 DIAGNOSIS — M869 Osteomyelitis, unspecified: Secondary | ICD-10-CM

## 2019-01-20 DIAGNOSIS — B951 Streptococcus, group B, as the cause of diseases classified elsewhere: Secondary | ICD-10-CM

## 2019-01-20 DIAGNOSIS — L089 Local infection of the skin and subcutaneous tissue, unspecified: Secondary | ICD-10-CM

## 2019-01-20 DIAGNOSIS — E10628 Type 1 diabetes mellitus with other skin complications: Secondary | ICD-10-CM

## 2019-01-20 DIAGNOSIS — Z9114 Patient's other noncompliance with medication regimen: Secondary | ICD-10-CM

## 2019-01-20 LAB — GLUCOSE, CAPILLARY
GLUCOSE-CAPILLARY: 100 mg/dL — AB (ref 70–99)
Glucose-Capillary: 73 mg/dL (ref 70–99)
Glucose-Capillary: 102 mg/dL — ABNORMAL HIGH (ref 70–99)
Glucose-Capillary: 94 mg/dL (ref 70–99)
Glucose-Capillary: 138 mg/dL — ABNORMAL HIGH (ref 70–99)

## 2019-01-20 LAB — BASIC METABOLIC PANEL
ANION GAP: 7 (ref 5–15)
BUN: 10 mg/dL (ref 6–20)
CO2: 26 mmol/L (ref 22–32)
Calcium: 8.3 mg/dL — ABNORMAL LOW (ref 8.9–10.3)
Chloride: 107 mmol/L (ref 98–111)
Creatinine, Ser: 0.81 mg/dL (ref 0.44–1.00)
GFR calc Af Amer: >60 mL/min (ref >60)
GFR calc non Af Amer: >60 mL/min (ref >60)
Glucose, Bld: 82 mg/dL (ref 70–99)
Potassium: 3.5 mmol/L (ref 3.5–5.1)
Sodium: 140 mmol/L (ref 135–145)

## 2019-01-20 LAB — CBC
HEMATOCRIT: 28 % — AB (ref 36.0–46.0)
Hemoglobin: 8.7 g/dL — ABNORMAL LOW (ref 12.0–15.0)
MCH: 26.9 pg (ref 26.0–34.0)
MCHC: 31.1 g/dL (ref 30.0–36.0)
MCV: 86.7 fL (ref 80.0–100.0)
NRBC: 0 % (ref 0.0–0.2)
Platelets: 393 10*3/uL (ref 150–400)
RBC: 3.23 MIL/uL — ABNORMAL LOW (ref 3.87–5.11)
RDW: 11.5 % (ref 11.5–15.5)
WBC: 7.5 10*3/uL (ref 4.0–10.5)

## 2019-01-20 MED ORDER — "INSULIN SYRINGE 27G X 1/2"" 1 ML MISC": 1.0000 | Freq: Every morning | 0 refills | Status: DC

## 2019-01-20 MED ORDER — ACETAMINOPHEN 325 MG PO TABS: 650.0000 mg | ORAL_TABLET | Freq: Four times a day (QID) | ORAL | 0 refills | Status: DC | PRN

## 2019-01-20 MED ORDER — FERROUS SULFATE 325 (65 FE) MG PO TABS
325.0000 mg | ORAL_TABLET | Freq: Two times a day (BID) | ORAL | Status: DC
  Administered 2019-01-21: 325 mg via ORAL
  Filled 2019-01-20: qty 1

## 2019-01-20 MED ORDER — ASCORBIC ACID 500 MG PO TABS: 500.0000 mg | ORAL_TABLET | Freq: Two times a day (BID) | ORAL | 0 refills | Status: DC

## 2019-01-20 MED ORDER — PROMETHAZINE HCL 25 MG/ML IJ SOLN
25.0000 mg | Freq: Four times a day (QID) | INTRAMUSCULAR | Status: DC | PRN
  Administered 2019-01-20: 25 mg via INTRAVENOUS
  Filled 2019-01-20: qty 1

## 2019-01-20 MED ORDER — CEFTRIAXONE IV (FOR PTA / DISCHARGE USE ONLY): 2.0000 g | INTRAVENOUS | 0 refills | Status: AC

## 2019-01-20 MED ORDER — METFORMIN HCL 500 MG PO TABS: 500.0000 mg | ORAL_TABLET | Freq: Two times a day (BID) | ORAL | 11 refills | Status: DC

## 2019-01-20 MED ORDER — INSULIN PEN NEEDLE 29G X 5MM MISC: 0 refills | Status: DC

## 2019-01-20 MED ORDER — ONDANSETRON HCL 4 MG PO TABS
4.0000 mg | ORAL_TABLET | Freq: Two times a day (BID) | ORAL | Status: DC
  Administered 2019-01-20 – 2019-01-21 (×2): 4 mg via ORAL
  Filled 2019-01-20 (×2): qty 1

## 2019-01-20 MED ORDER — ONDANSETRON HCL 4 MG/2ML IJ SOLN
4.0000 mg | Freq: Two times a day (BID) | INTRAMUSCULAR | Status: DC
  Filled 2019-01-20: qty 2

## 2019-01-20 MED ORDER — SODIUM CHLORIDE 0.9% FLUSH
10.0000 mL | Freq: Two times a day (BID) | INTRAVENOUS | Status: DC
  Administered 2019-01-20: 30 mL
  Administered 2019-01-21: 10 mL

## 2019-01-20 MED ORDER — INSULIN GLARGINE 100 UNIT/ML ~~LOC~~ SOLN
20.0000 [IU] | Freq: Every day | SUBCUTANEOUS | Status: DC
  Administered 2019-01-20: 20 [IU] via SUBCUTANEOUS
  Filled 2019-01-20 (×2): qty 0.2

## 2019-01-20 MED ORDER — METRONIDAZOLE 500 MG PO TABS: 500.0000 mg | ORAL_TABLET | Freq: Two times a day (BID) | ORAL | 0 refills | Status: AC

## 2019-01-20 MED ORDER — SODIUM CHLORIDE 0.9 % IV SOLN
2.0000 g | INTRAVENOUS | Status: DC
  Administered 2019-01-20 – 2019-01-21 (×2): 2 g via INTRAVENOUS
  Filled 2019-01-20: qty 20
  Filled 2019-01-20 (×2): qty 2

## 2019-01-20 MED ORDER — FERROUS SULFATE 325 (65 FE) MG PO TABS: 325.0000 mg | ORAL_TABLET | Freq: Two times a day (BID) | ORAL | 3 refills | Status: DC

## 2019-01-20 MED ORDER — OXYCODONE-ACETAMINOPHEN 5-325 MG PO TABS: 1.0000 | ORAL_TABLET | Freq: Four times a day (QID) | ORAL | 0 refills | Status: DC | PRN

## 2019-01-20 MED ORDER — INFLUENZA VAC SPLIT QUAD 0.5 ML IM SUSY: 0.5000 mL | PREFILLED_SYRINGE | INTRAMUSCULAR | Status: DC

## 2019-01-20 MED ORDER — BLOOD GLUCOSE MONITOR KIT: PACK | 0 refills | Status: DC

## 2019-01-20 MED ORDER — SODIUM CHLORIDE 0.9% FLUSH: 10.0000 mL | INTRAVENOUS | Status: DC | PRN

## 2019-01-20 MED ORDER — INSULIN GLARGINE 100 UNIT/ML ~~LOC~~ SOLN: 15.0000 [IU] | Freq: Every day | SUBCUTANEOUS | 11 refills | Status: DC

## 2019-01-20 MED ORDER — METRONIDAZOLE 500 MG PO TABS
500.0000 mg | ORAL_TABLET | Freq: Two times a day (BID) | ORAL | Status: DC
  Administered 2019-01-20 – 2019-01-21 (×2): 500 mg via ORAL
  Filled 2019-01-20 (×2): qty 1

## 2019-01-20 MED ORDER — VITAMIN C 500 MG PO TABS
500.0000 mg | ORAL_TABLET | Freq: Two times a day (BID) | ORAL | Status: DC
  Administered 2019-01-20 – 2019-01-21 (×3): 500 mg via ORAL
  Filled 2019-01-20 (×3): qty 1

## 2019-01-20 NOTE — Care Management (Signed)
Called [phone] to [employer] to find out what may be covered Was connected to benefits department This patient has no IV infusion benefit even for outpatient services, no Physical Therapy Benefit either.

## 2019-01-20 NOTE — Discharge Summary (Signed)
[place] at [place] NAME: Katie Gilbert    MR#:  [id number]  DATE OF BIRTH:  1991-05-07  DATE OF ADMISSION:  01/16/2019 ADMITTING PHYSICIAN: Henreitta Leber, MD  DATE OF DISCHARGE: 01/20/2019   PRIMARY CARE PHYSICIAN: Patient, No Pcp Per    ADMISSION DIAGNOSIS:  Diabetic foot infection ([place]) [E11.628, L08.9] Osteomyelitis of right foot, unspecified type (Katie Gilbert) [M86.9]  DISCHARGE DIAGNOSIS:  Active Problems:   Diabetic foot ulcer ([place])   SECONDARY DIAGNOSIS:   Past Medical History:  Diagnosis Date  . Genital herpes     HOSPITAL COURSE:   28 year old female with past medical history of diabetes who presents to the hospital due to right foot pain swelling and redness.  1. 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 alsoIncision and drainage deep webspace abscess right forefoot by Dr Vickki Muff 01/17/19 Suggested to call ID consult to decide type and length of antibiotic therapy ID suggested to continue ceftriaxone IV for [redacted] weeks along with oral metronidazole and follow-up in the clinic for the bone/surgical cultures.  2. Sepsis present on admission-secondary to right foot osteomyelitis/diabetic foot ulcer.  -Continue IV fluids, IV antibiotics as mentioned above. Follow cultures.   3. Leukocytosis-secondary to the infection -Follow white cell count with IV antibiotic therapy.   4. Diabetes type 1-patient is noncompliant has not taken the medications for a long time. - A1C 12.3% on 01/16/19  - increase lantus to 25 units SQ qhs - continue SSI, blood sugar fairly under control now. -Her blood sugar was running on the lower sides decreased Lantus to 15 units daily and added some metformin.  Counseled for dietary control.  Advised to get a glucose check and check blood sugar 3 times  daily.    DISCHARGE CONDITIONS:   Stable  CONSULTS OBTAINED:  Treatment Team:  Tsosie Billing, MD  DRUG ALLERGIES:  No Known Allergies  DISCHARGE MEDICATIONS:   Allergies as of 01/20/2019   No Known Allergies     Medication List    TAKE these medications   acetaminophen 325 MG tablet Commonly known as:  TYLENOL Take 2 tablets (650 mg total) by mouth every 6 (six) hours as needed for mild pain (or Fever >/= 101).   ascorbic acid 500 MG tablet Commonly known as:  VITAMIN C Take 1 tablet (500 mg total) by mouth 2 (two) times daily.   blood glucose meter kit and supplies Kit Dispense based on patient and insurance preference. Use up to four times daily as directed. (FOR ICD-9 250.00, 250.01).   cefTRIAXone  IVPB Commonly known as:  ROCEPHIN Inject 2 g into the vein daily for 13 days. Indication: Diabetic foot infection -rt foot/osteomyelitis Last Day of Therapy:  02/03/19 Labs - Once weekly:  CBC/D and BMP, Labs - Every other week:  ESR and CRP Start taking on:  January 21, 2019   ferrous sulfate 325 (65 FE) MG tablet Take 1 tablet (325 mg total) by mouth 2 (two) times daily with a meal.   insulin glargine 100 UNIT/ML injection Commonly known as:  LANTUS Inject 0.15 mLs (15 Units total) into the skin at bedtime.   Insulin Pen Needle 29G X 5MM Misc To use daily.   Insulin Syringe 27G X 1/2" 1 ML Misc 1 Act by Does not apply route every morning.   metFORMIN 500 MG tablet Commonly known  as:  GLUCOPHAGE Take 1 tablet (500 mg total) by mouth 2 (two) times daily with a meal.   metroNIDAZOLE 500 MG tablet Commonly known as:  FLAGYL Take 1 tablet (500 mg total) by mouth every 12 (twelve) hours for 14 days.   oxyCODONE-acetaminophen 5-325 MG tablet Commonly known as:  PERCOCET/ROXICET Take 1-2 tablets by mouth every 6 (six) hours as needed for moderate pain.            Home Infusion Instuctions  (From admission, onward)         Start      Ordered   01/20/19 0000  Home infusion instructions [facility] May follow [place] Dosing Protocol; May administer Cathflo as needed to maintain patency of vascular access device.; Flushing of vascular access device: per [facility] Protocol: 0.9% NaCl pre/post medica...    Question Answer Comment  Instructions May follow [place] Dosing Protocol   Instructions May administer Cathflo as needed to maintain patency of vascular access device.   Instructions Flushing of vascular access device: per [facility] Protocol: 0.9% NaCl pre/post medication administration and prn patency; Heparin 100 u/ml, 45m for implanted ports and Heparin 10u/ml, 573mfor all other central venous catheters.   Instructions May follow [facility] Anaphylaxis Protocol for First Dose Administration in the home: 0.9% NaCl at 25-50 ml/hr to maintain IV access for protocol meds. Epinephrine 0.3 ml IV/IM PRN and Benadryl 25-50 IV/IM PRN s/s of anaphylaxis.   Instructions [facility] Infusion Coordinator (RN) to assist per patient IV care needs in the home PRN.      01/20/19 1328           DISCHARGE INSTRUCTIONS:    Follow with podiatry clinic in 1 week.  If you experience worsening of your admission symptoms, develop shortness of breath, life threatening emergency, suicidal or homicidal thoughts you must seek medical attention immediately by calling 911 or calling your MD immediately  if symptoms less severe.  You Must read complete instructions/literature along with all the possible adverse reactions/side effects for all the Medicines you take and that have been prescribed to you. Take any new Medicines after you have completely understood and accept all the possible adverse reactions/side effects.   Please note  You were cared for by a hospitalist during your hospital stay. If you have any questions about your discharge medications or the care you received while you were in the hospital after you are discharged, you can  call the unit and asked to speak with the hospitalist on call if the hospitalist that took care of you is not available. Once you are discharged, your primary care physician will handle any further medical issues. Please note that NO REFILLS for any discharge medications will be authorized once you are discharged, as it is imperative that you return to your primary care physician (or establish a relationship with a primary care physician if you do not have one) for your aftercare needs so that they can reassess your need for medications and monitor your lab values.    Today   CHIEF COMPLAINT:   Chief Complaint  Patient presents with  . Foot Swelling    right    HISTORY OF PRESENT ILLNESS:  LaDametria Tuzzolinois a [age].o. female with a known history of diabetes type 1 who presents to the hospital due to right foot pain redness and swelling.  Patient says she noticed some swelling in her right foot a few days ago to about a  week ago and it has progressively gotten worse.  She started to noticing significant foul-smelling drainage from the right foot and significant pain on ambulation and therefore was a bit concerned and came to the ER for further evaluation.  Patient was ruled in for sepsis given some leukocytosis, tachycardia and also x-rays findings of the right foot suggesting of osteomyelitis/deep tissue infection.  Hospitalist services were contacted for admission.  Patient has a history of diabetes but is not compliant and has not taken any medications in a long time.  She denies any chest pains, nausea, vomiting, fevers, chills, abdominal pain, dysuria hematuria or any other associated symptoms presently.   VITAL SIGNS:  Blood pressure 126/87, pulse 86, temperature 98.6 F (37 C), temperature source Oral, resp. rate 18, height '6\' 3"'  (1.905 m), weight 119 kg, last menstrual period 01/01/2019, SpO2 97 %.  I/O:    Intake/Output Summary (Last 24 hours) at 01/20/2019 1340 Last data filed at  01/20/2019 0012 Gross per 24 hour  Intake 1406.13 ml  Output 1200 ml  Net 206.13 ml    PHYSICAL EXAMINATION:   HENT:     Head: Normocephalic and atraumatic.  Eyes:     Conjunctiva/sclera: Conjunctivae normal.     Pupils: Pupils are equal, round, and reactive to light.  Neck:     Musculoskeletal: Normal range of motion and neck supple.     Thyroid: No thyromegaly.     Trachea: No tracheal deviation.  Cardiovascular:     Rate and Rhythm: Normal rate and regular rhythm.     Heart sounds: Normal heart sounds.  Pulmonary:     Effort: Pulmonary effort is normal. No respiratory distress.     Breath sounds: Normal breath sounds. No wheezing.  Chest:     Chest wall: No tenderness.  Abdominal:     General: Bowel sounds are normal. There is no distension.     Palpations: Abdomen is soft.     Tenderness: There is no abdominal tenderness.  Musculoskeletal: Normal range of motion.       Feet:  Feet:     Comments: In dressing Skin:    General: Skin is warm and dry.     Findings: No rash.  Neurological:     Mental Status: She is alert and oriented to person, place, and time.     Cranial Nerves: No cranial nerve deficit.   DATA REVIEW:   CBC Recent Labs  Lab 01/20/19 0420  WBC 7.5  HGB 8.7*  HCT 28.0*  PLT 393    Chemistries  Recent Labs  Lab 01/16/19 1247  01/20/19 0420  NA 129*   < > 140  K 3.6   < > 3.5  CL 95*   < > 107  CO2 22   < > 26  GLUCOSE 355*   < > 82  BUN 8   < > 10  CREATININE 0.60   < > 0.81  CALCIUM 8.6*   < > 8.3*  AST 12*  --   --   ALT 11  --   --   ALKPHOS 110  --   --   BILITOT 0.9  --   --    < > = values in this interval not displayed.    Cardiac Enzymes No results for input(s): TROPONINI in the last 168 hours.  Microbiology Results  Results for orders placed or performed during the hospital encounter of 01/16/19  Urine Culture     Status: Abnormal  Collection Time: 01/16/19  3:00 PM  Result Value Ref Range Status   Specimen  Description   Final    URINE, RANDOM Performed at [facility], [address], [place], [place] [address]    Special Requests   Final    NONE Performed at [facility], [address] [place], [place] [address]    Culture MULTIPLE SPECIES PRESENT, SUGGEST RECOLLECTION (A)  Final   Report Status 01/19/2019 FINAL  Final  Aerobic/Anaerobic Culture (surgical/deep wound)     Status: None (Preliminary result)   Collection Time: 01/17/19  1:40 PM  Result Value Ref Range Status   Specimen Description WOUND  Final   Special Requests   Final    NONE Performed at [facility], [address] [place], [place] [address]    Gram Stain   Final    FEW WBC PRESENT, PREDOMINANTLY PMN FEW GRAM POSITIVE COCCI RARE GRAM POSITIVE RODS    Culture   Final    FEW GROUP B STREP(S.AGALACTIAE)ISOLATED TESTING AGAINST S. AGALACTIAE NOT ROUTINELY PERFORMED DUE TO PREDICTABILITY OF AMP/PEN/VAN SUSCEPTIBILITY.    Report Status PENDING  Incomplete    RADIOLOGY:  [place] Ekg Site Rite  Result Date: 01/20/2019 If [facility] image not attached, placement could not be confirmed due to current cardiac rhythm.   EKG:  No orders found for this or any previous visit.    Management plans discussed with the patient, family and they are in agreement.  CODE STATUS:     Code Status Orders  (From admission, onward)         Start     Ordered   01/16/19 1559  Full code  Continuous     01/16/19 1558        Code Status History    This patient has a current code status but no historical code status.      TOTAL TIME TAKING CARE OF THIS PATIENT: 35 minutes.    Vaughan Basta M.D on 01/20/2019 at 1:40 PM  Between 7am to 6pm - Pager - [phone]  After 6pm go to [email or web address] - password [email or web address] [place] [facility]  Office  [phone]  CC: Primary care physician; Patient, No Pcp Per   Note: This dictation was prepared with Dragon dictation along with  smaller phrase technology. Any transcriptional errors that result from this process are unintentional.

## 2019-01-20 NOTE — Progress Notes (Signed)
  Page received from RN stating that patient will be d/c'd today.  Patient was seen by diabetes coordinator last week.  Called and spoke to patient prior to d/c and asked if she has questions.  Patient will be d/c'd on Lantus 15 units daily and Metformin.  Explained this to patient and she states she has no questions.  Reinforced the importance of monitoring blood sugars as well. Patient verbalized understanding.   Thanks  Beryl Meager, RN, BC-ADM Inpatient Diabetes Coordinator Pager [phone]

## 2019-01-20 NOTE — Progress Notes (Signed)
Peripherally Inserted Central Catheter/Midline Placement  The IV Nurse has discussed with the patient and/or persons authorized to consent for the patient, the purpose of this procedure and the potential benefits and risks involved with this procedure.  The benefits include less needle sticks, lab draws from the catheter, and the patient may be discharged home with the catheter. Risks include, but not limited to, infection, bleeding, blood clot (thrombus formation), and puncture of an artery; nerve damage and irregular heartbeat and possibility to perform a PICC exchange if needed/ordered by physician.  Alternatives to this procedure were also discussed.  Bard Power PICC patient education guide, fact sheet on infection prevention and patient information card has been provided to patient /or left at bedside.    PICC/Midline Placement Documentation  PICC Single Lumen 01/20/19 PICC Right Basilic 38 cm 0 cm (Active)  Indication for Insertion or Continuance of Line Home intravenous therapies (PICC only) 01/20/2019  6:00 PM  Exposed Catheter (cm) 0 cm 01/20/2019  6:00 PM  Site Assessment Clean;Dry;Intact 01/20/2019  6:00 PM  Line Status Flushed;Blood return noted 01/20/2019  6:00 PM  Dressing Type Transparent 01/20/2019  6:00 PM  Dressing Status Clean;Dry;Intact;Antimicrobial disc in place 01/20/2019  6:00 PM  Dressing Change Due 01/27/19 01/20/2019  6:00 PM       Stacie GlazeJoyce, Jonathan [place] 01/20/2019, 6:45 PM

## 2019-01-20 NOTE — Treatment Plan (Addendum)
Diagnosis: Diabetic foot infection -rt foot/osteomyelitis Baseline Creatinine 0.81  Culture Result: GBS  No Known Allergies  OPAT Orders Discharge antibiotics: Ceftriaxone 2 grams IV every 24 hours  End Date: 02/03/19 ( may need more-after assessment )  PO Flagyl 54m Q 12 until 02/03/19  [facility]  Labs weekly on Wednesday while on IV antibiotics: _X_ CBC with differential  _X_ CMP _X_ CRP _X_ ESR    __X Please leave PIC in place until doctor has seen patient or been notified  Fax weekly labs to [phone] Clinic Follow Up Appt: 2weeks-   Call [phone] make appt on 01/30/19

## 2019-01-20 NOTE — Consult Note (Signed)
NAME: Katie Gilbert  DOB: May 18, 1991  MRN: [id number]  Date/Time: 01/20/2019 9:55 AM  Dr. Ether Griffins Subjective:  REASON FOR CONSULT: Right foot infection/ osteomyelitis ? Katie Gilbert is a 28 y.o. female with a history of type 1 diabetes mellitus not on any medication is admitted with 2-week history of a right foot wound. Patient states 2 weeks ago she noted a spot on her right foot on the fifth toe and she thought it was secondary to the shoe rubbing on her foot.  Later it started to bleed but she did not pay much attention.  She works at [employer] and on Thursday the foot was hurting her a lot and she also noted discoloration of the foot.  So she came to the hospital.  She also states that the foot had a foul-smelling odor coming from it.  And she was having chills.  She was not eating well the last 1 week.  In the ED on 01/16/2019 her vitals were blood pressure of 164/99, heart rate of 126, temperature of 99.4.  WBC was 16.9.  Blood sugar was 355, creatinine of 0.60 hemoglobin A1c was 12.3.  MRI of the right foot showed septic arthritis involving the PIP joint and MTP joint of the fifth toe with osteo-involving the middle and proximal phalanx and also the distal fifth metatarsal shaft and fifth metatarsal head.  There was also fourth MTP joint involvement.  She was started on vancomycin and Zosyn.  She was seen by podiatrist Dr. Ether Griffins and underwent partial amputation of the fifth ray and partial amputation of the fourth ray and incision and drainage of the deep webspace  abscess on the right foot.  Am consulted to advise on IV antibiotics to go home with. Patient says she is feeling better now. She is tearful.  She says she had neglected her medical care.  She has not been taking any medication for type 1 diabetes.  She was diagnosed when she was in the teens.  Her father died at the age of 98 from myocardial infarction.  He was also a diabetic.   Past Medical History:  Diagnosis Date  . Genital  herpes     Past Surgical History:  Procedure Laterality Date  . I&D EXTREMITY Right 01/17/2019   Procedure: IRRIGATION AND DEBRIDEMENT RIGHT FOOT;  Surgeon: Gwyneth Revels, DPM;  Location: [facility] ORS;  Service: Podiatry;  Laterality: Right;  Social history Non-smoker No alcohol or illicit drug use Lives between her mom's place and her boyfriend's place. She works at [employer] History  Problem Relation Age of Onset  . Hypertension Father   . Heart attack Father   . Diabetes Father   . Diabetes Maternal Grandmother    No Known Allergies  ? Current Facility-Administered Medications  Medication Dose Route Frequency Provider Last Rate Last Dose  . acetaminophen (TYLENOL) tablet 650 mg  650 mg Oral Q6H PRN Gwyneth Revels, DPM   650 mg at 01/18/19 1828   Or  . acetaminophen (TYLENOL) suppository 650 mg  650 mg Rectal Q6H PRN Gwyneth Revels, DPM      . chlorhexidine (HIBICLENS) 4 % liquid 4 application  60 mL Topical Once Gwyneth Revels, DPM      . enoxaparin (LOVENOX) injection 40 mg  40 mg Subcutaneous Q24H Gwyneth Revels, DPM   40 mg at 01/19/19 2241  . ferrous sulfate tablet 325 mg  325 mg Oral BID WC Altamese Dilling, MD      . Melene Muller ON 01/21/2019] Influenza  vac split quadrivalent PF (FLUARIX) injection 0.5 mL  0.5 mL Intramuscular Tomorrow-1000 Altamese DillingVachhani, Vaibhavkumar, MD      . insulin aspart (novoLOG) injection 0-15 Units  0-15 Units Subcutaneous TID WC Gwyneth RevelsFowler, Justin, DPM   2 Units at 01/19/19 1213  . insulin aspart (novoLOG) injection 0-5 Units  0-5 Units Subcutaneous QHS Gwyneth RevelsFowler, Justin, DPM   3 Units at 01/16/19 2152  . insulin glargine (LANTUS) injection 20 Units  20 Units Subcutaneous QHS Altamese DillingVachhani, Vaibhavkumar, MD      . ketorolac (TORADOL) 30 MG/ML injection 30 mg  30 mg Intravenous Q6H PRN Gwyneth RevelsFowler, Justin, DPM   30 mg at 01/16/19 2012  . morphine 2 MG/ML injection 2 mg  2 mg Intravenous Q4H PRN Gwyneth RevelsFowler, Justin, DPM   2 mg at 01/17/19 1944  . multivitamin with minerals  tablet 1 tablet  1 tablet Oral Daily Gwyneth RevelsFowler, Justin, DPM   1 tablet at 01/20/19 [id number]  . nutrition supplement (JUVEN) (JUVEN) powder packet 1 packet  1 packet Oral BID BM Gwyneth RevelsFowler, Justin, DPM   1 packet at 01/19/19 1432  . ondansetron (ZOFRAN) tablet 4 mg  4 mg Oral Q6H PRN Gwyneth RevelsFowler, Justin, DPM       Or  . ondansetron [facility](ZOFRAN) injection 4 mg  4 mg Intravenous Q6H PRN Gwyneth RevelsFowler, Justin, DPM   4 mg at 01/19/19 1847  . oxyCODONE-acetaminophen (PERCOCET/ROXICET) 5-325 MG per tablet 1-2 tablet  1-2 tablet Oral Q4H PRN Gwyneth RevelsFowler, Justin, DPM   1 tablet at 01/19/19 2245  . piperacillin-tazobactam (ZOSYN) IVPB 3.375 g  3.375 g Intravenous Q8H Gwyneth RevelsFowler, Justin, DPM 12.5 mL/hr at 01/20/19 0628 3.375 g at 01/20/19 [id number]  . potassium chloride SA (K-DUR,KLOR-CON) CR tablet 20 mEq  20 mEq Oral BID Altamese DillingVachhani, Vaibhavkumar, MD   20 mEq at 01/20/19 0854  . vancomycin (VANCOCIN) 1,250 mg in sodium chloride 0.9 % 250 mL IVPB  1,250 mg Intravenous Q8H Gwyneth RevelsFowler, Justin, DPM 166.7 mL/hr at 01/20/19 0857 1,250 mg at 01/20/19 0857  . vitamin C (ASCORBIC ACID) tablet 500 mg  500 mg Oral BID Altamese DillingVachhani, Vaibhavkumar, MD   500 mg at 01/20/19 [id number]     Abtx:  Anti-infectives (From admission, onward)   Start     Dose/Rate Route Frequency Ordered Stop   01/17/19 0000  vancomycin (VANCOCIN) 1,250 mg in sodium chloride 0.9 % 250 mL IVPB     1,250 mg 166.7 mL/hr over 90 Minutes Intravenous Every 8 hours 01/16/19 1623     01/16/19 2300  piperacillin-tazobactam (ZOSYN) IVPB 3.375 g     3.375 g 12.5 mL/hr over 240 Minutes Intravenous Every 8 hours 01/16/19 1625     01/16/19 2000  Ampicillin-Sulbactam (UNASYN) 3 g in sodium chloride 0.9 % 100 mL IVPB  Status:  Discontinued     3 g 200 mL/hr over 30 Minutes Intravenous Every 6 hours 01/16/19 1412 01/16/19 1438   01/16/19 1445  piperacillin-tazobactam (ZOSYN) IVPB 3.375 g     3.375 g 100 mL/hr over 30 Minutes Intravenous  Once 01/16/19 1439 01/16/19 1652   01/16/19 1445  vancomycin (VANCOCIN)  1,500 mg in sodium chloride 0.9 % 500 mL IVPB     1,500 mg 250 mL/hr over 120 Minutes Intravenous  Once 01/16/19 1446 01/16/19 1809   01/16/19 1430  Ampicillin-Sulbactam (UNASYN) 3 g in sodium chloride 0.9 % 100 mL IVPB  Status:  Discontinued     3 g 200 mL/hr over 30 Minutes Intravenous  Once 01/16/19 1412 01/16/19 1438   01/16/19 1415  vancomycin (  VANCOCIN) IVPB 1000 mg/200 mL premix     1,000 mg 200 mL/hr over 60 Minutes Intravenous  Once 01/16/19 1401 01/16/19 1610      REVIEW OF SYSTEMS:  Const: negative fever, positive chills, negative weight loss Eyes: negative diplopia or visual changes, negative eye pain ENT: negative coryza, negative sore throat Resp: negative cough, hemoptysis, dyspnea Cards: negative for chest pain, palpitations, lower extremity edema GU: negative for frequency, dysuria and hematuria GI: Negative for abdominal pain, diarrhea, bleeding, constipation Skin: negative for rash and pruritus Heme: negative for easy bruising and gum/nose bleeding MS: Right foot pain Neurolo:negative for headaches, dizziness, vertigo, memory problems  Psych: negative for feelings of anxiety, depression  Endocrine: Polyuria and polydipsia Allergy/Immunology- negative for any medication or food allergies ?  Objective:  VITALS:  BP 126/87 (BP Location: Left Arm)   Pulse 86   Temp 98.6 F (37 C) (Oral)   Resp 18   Ht 6\' 3"  (1.905 m)   Wt 119 kg   LMP 01/01/2019 (Exact Date)   SpO2 97%   BMI 32.79 kg/m  PHYSICAL EXAM:  General: Alert, cooperative, no distress, appears stated age.  Head: Normocephalic, without obvious abnormality, atraumatic. Eyes: Conjunctivae clear, anicteric sclerae. Pupils are equal ENT Nares normal. No drainage or sinus tenderness. Lips, mucosa, and tongue normal. No Thrush Neck: Supple, symmetrical, no adenopathy, thyroid: non tender no carotid bruit and no JVD. Back: No CVA tenderness. Lungs: Clear to auscultation bilaterally. No Wheezing or  Rhonchi. No rales. Heart: Regular rate and rhythm, no murmur, rub or gallop. Abdomen: Soft, non-tender,not distended. Bowel sounds normal. No masses Extremities: Right foot, fifth and fourth toe ray amputation.  Surgical site clean has small wound Discoloration of the right foot  01/20/19- post surgery   Before surgery   Callus on the right great toe plantar aspect debrided No has a superficial wound    Skin: Hyperpigmented spots over the skin multiple tattoos on her arms Lymph: Cervical, supraclavicular normal. Neurologic: Grossly non-focal Pertinent Labs Lab Results CBC CBC Latest Ref Rng & Units 01/20/2019 01/18/2019 01/17/2019  WBC 4.0 - 10.5 K/uL 7.5 11.8(H) 13.4(H)  Hemoglobin 12.0 - 15.0 g/dL 3.7(S) 8.2(L) 0.7(E)  Hematocrit 36.0 - 46.0 % 28.0(L) 27.5(L) 29.5(L)  Platelets 150 - 400 K/uL 393 323 354    CMP Latest Ref Rng & Units 01/20/2019 01/19/2019 01/18/2019  Glucose 70 - 99 mg/dL 82 - 675(Q)  BUN 6 - 20 mg/dL 10 - 12  Creatinine 4.92 - 1.00 mg/dL 0.10 0.71 2.19  Sodium 135 - 145 mmol/L 140 - 136  Potassium 3.5 - 5.1 mmol/L 3.5 - 3.3(L)  Chloride 98 - 111 mmol/L 107 - 104  CO2 22 - 32 mmol/L 26 - 26  Calcium 8.9 - 10.3 mg/dL 8.3(L) - 7.6(L)  Total Protein 6.5 - 8.1 g/dL - - -  Total Bilirubin 0.3 - 1.2 mg/dL - - -  Alkaline Phos 38 - 126 U/L - - -  AST 15 - 41 U/L - - -  ALT 0 - 44 U/L - - -      Microbiology: Recent Results (from the past 240 hour(s))  Urine Culture     Status: Abnormal   Collection Time: 01/16/19  3:00 PM  Result Value Ref Range Status   Specimen Description   Final    URINE, RANDOM Performed at [facility], [address] [place], [place] [address]    Special Requests   Final    NONE Performed at [facility], [address] [place], [place] [address]    Culture MULTIPLE SPECIES PRESENT, SUGGEST RECOLLECTION (A)  Final   Report Status 01/19/2019 FINAL  Final  Aerobic/Anaerobic Culture (surgical/deep wound)      Status: None (Preliminary result)   Collection Time: 01/17/19  1:40 PM  Result Value Ref Range Status   Specimen Description WOUND  Final   Special Requests   Final    NONE Performed at [facility], [address] [place], [place] [address]    Gram Stain   Final    FEW WBC PRESENT, PREDOMINANTLY PMN FEW GRAM POSITIVE COCCI RARE GRAM POSITIVE RODS    Culture   Final    FEW GROUP B STREP(S.AGALACTIAE)ISOLATED TESTING AGAINST S. AGALACTIAE NOT ROUTINELY PERFORMED DUE TO PREDICTABILITY OF AMP/PEN/VAN SUSCEPTIBILITY.    Report Status PENDING  Incomplete   IMAGING RESULTS: ?MRI reviewed personally  Impression/Recommendation 28 year old female with type 1 diabetes uncontrolled because of noncompliance presented with 2-week history of right foot infection.  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  Status post partial ray amputation of the fifth and the fourth toe.  ?Wound cultures so far group B streptococcus.  She is currently on Vanco and Zosyn.  Will change to ceftriaxone on p.o. Flagyl.  She will need at least 2 weeks of IV antibiotics.  May need more if the wound margins not clear of osteomyelitis. Outpatient antibiotic orders placed.  She will need PICC line. She will follow-up with me as outpatient in 2 weeks.  Type 1 diabetes poorly controlled with a hemoglobin A1c of more than 12.  She has not been taking any medication so far.  She has been started on insulin. Patient plans to be more hands-on with her medical care. ? ? ___________________________________________________ Discussed with patient, and hospitalist. Note:  This document was prepared using Dragon voice recognition software and may include unintentional dictation errors.

## 2019-01-20 NOTE — Progress Notes (Signed)
Inpatient Diabetes Program Recommendations  AACE/ADA: New Consensus Statement on Inpatient Glycemic Control (2015)  Target Ranges:  Prepandial:   less than 140 mg/dL      Peak postprandial:   less than 180 mg/dL (1-2 hours)      Critically ill patients:  140 - 180 mg/dL   Lab Results  Component Value Date   GLUCAP 73 01/20/2019   HGBA1C 12.3 (H) 01/16/2019    Review of Glycemic ControlResults for IZSABELLA, KUO (MRN [id number]) as of 01/20/2019 09:21  Ref. Range 01/19/2019 07:39 01/19/2019 11:48 01/19/2019 16:39 01/19/2019 21:28 01/20/2019 07:56  Glucose-Capillary Latest Ref Range: 70 - 99 mg/dL 638 (H) 937 (H) 342 (H) 85 73   Diabetes history: DM2 Outpatient Diabetes medications: None listed on home med list but noted Metformin 500 mg BID (per ED MD note) Current orders for Inpatient glycemic control: Lantus 25 units QHS, Novolog 0-15 units TID with meals, Novolog 0-5 units QHS  Inpatient Diabetes Program Recommendations:  Note that fasting blood sugar<100 mg/dL. Please reduce Lantus to 20 units units q HS.  Thanks,  Beryl Meager, RN, BC-ADM Inpatient Diabetes Coordinator Pager [phone] (8a-5p)

## 2019-01-20 NOTE — Care Management Note (Signed)
Case Management Note  Patient Details  Name: Katie Gilbert MRN: [id number] Date of Birth: 28-Jan-1991  Subjective/Objective:                  Met with Patient to discuss DC plan Patient will use [facility] for infuison of ABX Patient provided the choice list per CMS.gov and chooses [facility] for [facility] PT Patient needs a RW and I notified [facility] of the need Corene Cornea accepts Patient is having An RX for Glucometer andf supplies and can get thru any pharmacy, she stated that she can afford it and will not have a problem getting Lives with mom Patient drives normally and her mother will provide back up transportation Patient uses [place] Patient provided [place] PCP resource.   Action/Plan: Provided [place] list and chose [facility], RW ordered thru [place], [facility] to provide infusion services PCP resource list provided for not having a PCP  Expected Discharge Date:                  Expected Discharge Plan:     In-House Referral:     Discharge planning Services  CM Consult  Post Acute Care Choice:    Choice offered to:  Patient  DME Arranged:  [place] rolling DME Agency:  [place]:  PT [facility] Agency:  [place]  Status of Service:  Completed, signed off  If discussed at [place] of Stay Meetings, dates discussed:    Additional Comments:  Su Hilt, RN 01/20/2019, 11:41 AM

## 2019-01-20 NOTE — Care Management (Signed)
  I explained to the patient since they will be using [facility] for infusion they will need to use the same company for [facility] and wound  Care.  She agreed Notified Sherrilyn Rist at [facility] [phone] of the need for IV ABX and [facility] PT as well as wound care, Sherrilyn Rist accepted the patient.

## 2019-01-20 NOTE — Progress Notes (Signed)
3 Days Post-Op   Subjective/Chief Complaint: Patient seen.  Doing well with no specific complaints.   Objective: Vital signs in last 24 hours: Temp:  [98.1 F (36.7 C)-98.6 F (37 C)] 98.6 F (37 C) (02/03 0759) Pulse Rate:  [86-91] 86 (02/03 0759) Resp:  [18] 18 (02/03 0759) BP: (118-129)/(79-87) 126/87 (02/03 0759) SpO2:  [95 %-99 %] 97 % (02/03 0759) Last BM Date: 01/16/19  Intake/Output from previous day: 02/02 0701 - 02/03 0700 In: 1406.1 [P.O.:480; IV Piggyback:926.1] Out: 1200 [Urine:1200] Intake/Output this shift: No intake/output data recorded.  The bandage is dry and intact.  No strikethrough.  Lab Results:  Recent Labs    01/18/19 0224 01/20/19 0420  WBC 11.8* 7.5  HGB 8.8* 8.7*  HCT 27.5* 28.0*  PLT 323 393   BMET Recent Labs    01/18/19 0224 01/19/19 0337 01/20/19 0420  NA 136  --  140  K 3.3*  --  3.5  CL 104  --  107  CO2 26  --  26  GLUCOSE 228*  --  82  BUN 12  --  10  CREATININE 0.86 0.69 0.81  CALCIUM 7.6*  --  8.3*   PT/INR No results for input(s): LABPROT, INR in the last 72 hours. ABG No results for input(s): PHART, HCO3 in the last 72 hours.  Invalid input(s): PCO2, PO2  Studies/Results: No results found.  Anti-infectives: Anti-infectives (From admission, onward)   Start     Dose/Rate Route Frequency Ordered Stop   01/17/19 0000  vancomycin (VANCOCIN) 1,250 mg in sodium chloride 0.9 % 250 mL IVPB     1,250 mg 166.7 mL/hr over 90 Minutes Intravenous Every 8 hours 01/16/19 1623     01/16/19 2300  piperacillin-tazobactam (ZOSYN) IVPB 3.375 g     3.375 g 12.5 mL/hr over 240 Minutes Intravenous Every 8 hours 01/16/19 1625     01/16/19 2000  Ampicillin-Sulbactam (UNASYN) 3 g in sodium chloride 0.9 % 100 mL IVPB  Status:  Discontinued     3 g 200 mL/hr over 30 Minutes Intravenous Every 6 hours 01/16/19 1412 01/16/19 1438   01/16/19 1445  piperacillin-tazobactam (ZOSYN) IVPB 3.375 g     3.375 g 100 mL/hr over 30 Minutes  Intravenous  Once 01/16/19 1439 01/16/19 1652   01/16/19 1445  vancomycin (VANCOCIN) 1,500 mg in sodium chloride 0.9 % 500 mL IVPB     1,500 mg 250 mL/hr over 120 Minutes Intravenous  Once 01/16/19 1446 01/16/19 1809   01/16/19 1430  Ampicillin-Sulbactam (UNASYN) 3 g in sodium chloride 0.9 % 100 mL IVPB  Status:  Discontinued     3 g 200 mL/hr over 30 Minutes Intravenous  Once 01/16/19 1412 01/16/19 1438   01/16/19 1415  vancomycin (VANCOCIN) IVPB 1000 mg/200 mL premix     1,000 mg 200 mL/hr over 60 Minutes Intravenous  Once 01/16/19 1401 01/16/19 1610      Assessment/Plan: s/p Procedure(s): IRRIGATION AND DEBRIDEMENT RIGHT FOOT (Right) Assessment: Stable status post debridement and amputation.   Plan: Dressing left intact.  Patient should be stable for discharge today with home health care for 3 times a week dressing changes.  Nonweightbearing on the right foot with crutches although she may put a little pressure on the heel for transfer.  Plan for follow-up with Dr. Ether Griffins in a couple of weeks for suture removal.  LOS: 4 days    Ricci Barker 01/20/2019

## 2019-01-20 NOTE — Care Management (Signed)
[facility] contacted me and let me know the patient has no IV infusion benefit and it would be self pay.  They also do not offer any PT services.  Requested to cancel using [facility].  Patient is unable to afford self pay. Will reach out to [facility]  and see if can be set up as charity, insurance also does not cover PT or other [facility] services

## 2019-01-20 NOTE — Progress Notes (Signed)
PHARMACY CONSULT NOTE FOR:  OUTPATIENT  PARENTERAL ANTIBIOTIC THERAPY (OPAT)  Indication: Diabetic foot infection -rt foot/osteomyelitis Regimen: Ceftriaxone 2 grams IV every 24 hours  End date: 02/03/19  IV antibiotic discharge orders are pended. To discharging provider:  please sign these orders via discharge navigator,  Select New Orders & click on the button choice - Manage This Unsigned Work.     Thank you for allowing pharmacy to be a part of this patient's care.  Katie Gilbert 01/20/2019, 1:19 PM

## 2019-01-20 NOTE — Progress Notes (Signed)
[employer] - [place] at [facility]   PATIENT NAME: Katie GlassingLashante Gilbert    MR#:  [id number]  DATE OF BIRTH:  May 18, 1991  SUBJECTIVE:  CHIEF COMPLAINT:   Chief Complaint  Patient presents with  . Foot Swelling    right  no complaints other than foot pain, getting better.  Able to ambulate with help of walker.  Keeping nonweightbearing. Had recurrent vomiting today.  REVIEW OF SYSTEMS:  Review of Systems  Constitutional: Negative for diaphoresis, fever, malaise/fatigue and weight loss.  HENT: Negative for ear discharge, ear pain, hearing loss, nosebleeds, sore throat and tinnitus.   Eyes: Negative for blurred vision and pain.  Respiratory: Negative for cough, hemoptysis, shortness of breath and wheezing.   Cardiovascular: Negative for chest pain, palpitations, orthopnea and leg swelling.  Gastrointestinal: Negative for abdominal pain, blood in stool, constipation, diarrhea, heartburn, nausea and vomiting.  Genitourinary: Negative for dysuria, frequency and urgency.  Musculoskeletal: Negative for back pain and myalgias.  Skin: Negative for itching and rash.  Neurological: Negative for dizziness, tingling, tremors, focal weakness, seizures, weakness and headaches.  Psychiatric/Behavioral: Negative for depression. The patient is not nervous/anxious.     DRUG ALLERGIES:  No Known Allergies VITALS:  Blood pressure (!) 144/92, pulse 88, temperature 97.8 F (36.6 C), resp. rate 18, height 6\' 3"  (1.905 m), weight 119 kg, last menstrual period 01/01/2019, SpO2 98 %. PHYSICAL EXAMINATION:  Physical Exam HENT:     Head: Normocephalic and atraumatic.  Eyes:     Conjunctiva/sclera: Conjunctivae normal.     Pupils: Pupils are equal, round, and reactive to light.  Neck:     Musculoskeletal: Normal range of motion and neck supple.     Thyroid: No thyromegaly.     Trachea: No tracheal deviation.  Cardiovascular:     Rate and Rhythm: Normal rate and regular rhythm.   Heart sounds: Normal heart sounds.  Pulmonary:     Effort: Pulmonary effort is normal. No respiratory distress.     Breath sounds: Normal breath sounds. No wheezing.  Chest:     Chest wall: No tenderness.  Abdominal:     General: Bowel sounds are normal. There is no distension.     Palpations: Abdomen is soft.     Tenderness: There is no abdominal tenderness.  Musculoskeletal: Normal range of motion.       Feet:  Feet:     Comments: In dressing Skin:    General: Skin is warm and dry.     Findings: No rash.  Neurological:     Mental Status: She is alert and oriented to person, place, and time.     Cranial Nerves: No cranial nerve deficit.    LABORATORY PANEL:  Female CBC Recent Labs  Lab 01/20/19 0420  WBC 7.5  HGB 8.7*  HCT 28.0*  PLT 393   ------------------------------------------------------------------------------------------------------------------ Chemistries  Recent Labs  Lab 01/16/19 1247  01/20/19 0420  NA 129*   < > 140  K 3.6   < > 3.5  CL 95*   < > 107  CO2 22   < > 26  GLUCOSE 355*   < > 82  BUN 8   < > 10  CREATININE 0.60   < > 0.81  CALCIUM 8.6*   < > 8.3*  AST 12*  --   --   ALT 11  --   --   ALKPHOS 110  --   --   BILITOT 0.9  --   --    < > =  values in this interval not displayed.   RADIOLOGY:  [place] Ekg Site Rite  Result Date: 01/20/2019 If [facility] image not attached, placement could not be confirmed due to current cardiac rhythm.  ASSESSMENT AND PLAN:  28 year old female with past medical history of diabetes who presents to the hospital due to right foot pain swelling and redness.  1.  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 also Incision and drainage deep webspace abscess right forefoot by Dr Ether Griffins 01/17/19 Suggested to call ID consult to decide type and length of antibiotic therapy  2.  Sepsis  present on admission-secondary to right foot osteomyelitis/diabetic foot ulcer.   -Continue IV fluids, IV antibiotics as mentioned above.  Follow cultures.    3.  Leukocytosis-secondary to the infection -Follow white cell count with IV antibiotic therapy.    4.  Diabetes type 1-patient is noncompliant has not taken the medications for a long time. - A1C 12.3% on 01/16/19  - increase lantus to 25 units SQ qhs - continue SSI, blood sugar fairly under control now.  5.  Vomiting Patient denies abdominal pain. I will check urinalysis and x-ray KUB.  All the records are reviewed and case discussed with Care Management/Social Worker. Management plans discussed with the patient, Dr Ether Griffins and they are in agreement.  CODE STATUS: Full Code  TOTAL TIME TAKING CARE OF THIS PATIENT: 35 minutes.   More than 50% of the time was spent in counseling/coordination of care: YES  POSSIBLE D/C IN 2-3 DAYS, DEPENDING ON CLINICAL CONDITION.   Altamese Dilling M.D on 01/20/2019 at 4:12 PM  Between 7am to 6pm - Pager - [phone]  After 6pm go to [email or web address] - Social research officer, government  [employer] [facility]  Office  [phone]  CC: Primary care physician; Patient, No Pcp Per  Note: This dictation was prepared with Dragon dictation along with smaller phrase technology. Any transcriptional errors that result from this process are unintentional.

## 2019-01-20 NOTE — Care Management (Signed)
Patient has [employer] number [id number] no Infusion coverage no HH services coverage. Sent message to Barbara Cower at [facility] requesting to see if they can take a charity case for this patient

## 2019-01-21 LAB — AEROBIC/ANAEROBIC CULTURE W GRAM STAIN (SURGICAL/DEEP WOUND)

## 2019-01-21 LAB — GLUCOSE, CAPILLARY
GLUCOSE-CAPILLARY: 112 mg/dL — AB (ref 70–99)
Glucose-Capillary: 160 mg/dL — ABNORMAL HIGH (ref 70–99)
Glucose-Capillary: 163 mg/dL — ABNORMAL HIGH (ref 70–99)

## 2019-01-21 LAB — SURGICAL PATHOLOGY

## 2019-01-21 MED ORDER — ONDANSETRON 4 MG PO TBDP: 4.0000 mg | ORAL_TABLET | Freq: Three times a day (TID) | ORAL | 0 refills | Status: DC | PRN

## 2019-01-21 MED ORDER — LIVING WELL WITH DIABETES BOOK
Freq: Once | Status: AC
  Administered 2019-01-21: 12:00:00
  Filled 2019-01-21: qty 1

## 2019-01-21 NOTE — Progress Notes (Signed)
4 Days Post-Op   Subjective/Chief Complaint: Patient seen.  Feeling pretty good.  Denies nausea that she had yesterday.   Objective: Vital signs in last 24 hours: Temp:  [97.8 F (36.6 C)-98.6 F (37 C)] 98.1 F (36.7 C) (02/04 0724) Pulse Rate:  [86-99] 99 (02/04 0724) Resp:  [16-18] 18 (02/04 0724) BP: (126-144)/(81-92) 128/81 (02/04 0724) SpO2:  [97 %-98 %] 98 % (02/04 0724) Last BM Date: 01/18/19  Intake/Output from previous day: 02/03 0701 - 02/04 0700 In: -  Out: 900 [Urine:500; Emesis/NG output:400] Intake/Output this shift: No intake/output data recorded.  Mild drainage on the bandaging on the right foot.  Upon removal the proximal incision is well coapted with some opening at the distal area that cannot be completely closed.  Minimal expressible drainage.  Lab Results:  Recent Labs    01/20/19 0420  WBC 7.5  HGB 8.7*  HCT 28.0*  PLT 393   BMET Recent Labs    01/19/19 0337 01/20/19 0420  NA  --  140  K  --  3.5  CL  --  107  CO2  --  26  GLUCOSE  --  82  BUN  --  10  CREATININE 0.69 0.81  CALCIUM  --  8.3*   PT/INR No results for input(s): LABPROT, INR in the last 72 hours. ABG No results for input(s): PHART, HCO3 in the last 72 hours.  Invalid input(s): PCO2, PO2  Studies/Results: Dg Abd 1 View  Result Date: 01/20/2019 CLINICAL DATA:  Vomiting since this morning which has continued throughout the day EXAM: ABDOMEN - 1 VIEW COMPARISON:  None FINDINGS: Nonobstructive bowel gas pattern. Scattered normal stool throughout colon to rectum. Small amount gas within stomach. No bowel dilatation or bowel wall thickening. Osseous structures unremarkable. No urinary tract calcification. Questionable infiltrate at RIGHT lung base. IMPRESSION: Normal bowel gas pattern. Questionable RIGHT basilar pulmonary infiltrate. Electronically Signed   By: Ulyses Southward M.D.   On: 01/20/2019 16:54   [place] Ekg Site Rite  Result Date: 01/20/2019 If Site Rite image not attached,  placement could not be confirmed due to current cardiac rhythm.   Anti-infectives: Anti-infectives (From admission, onward)   Start     Dose/Rate Route Frequency Ordered Stop   01/21/19 0000  cefTRIAXone (ROCEPHIN) IVPB     2 g Intravenous Every 24 hours 01/20/19 1328 02/03/19 2359   01/20/19 1300  cefTRIAXone (ROCEPHIN) 2 g in sodium chloride 0.9 % 100 mL IVPB     2 g 200 mL/hr over 30 Minutes Intravenous Every 24 hours 01/20/19 1111     01/20/19 1200  metroNIDAZOLE (FLAGYL) tablet 500 mg     500 mg Oral Every 12 hours 01/20/19 1111     01/20/19 0000  metroNIDAZOLE (FLAGYL) 500 MG tablet     500 mg Oral Every 12 hours 01/20/19 1337 02/03/19 2359   01/17/19 0000  vancomycin (VANCOCIN) 1,250 mg in sodium chloride 0.9 % 250 mL IVPB  Status:  Discontinued     1,250 mg 166.7 mL/hr over 90 Minutes Intravenous Every 8 hours 01/16/19 1623 01/20/19 1110   01/16/19 2300  piperacillin-tazobactam (ZOSYN) IVPB 3.375 g  Status:  Discontinued     3.375 g 12.5 mL/hr over 240 Minutes Intravenous Every 8 hours 01/16/19 1625 01/20/19 1110   01/16/19 2000  Ampicillin-Sulbactam (UNASYN) 3 g in sodium chloride 0.9 % 100 mL IVPB  Status:  Discontinued     3 g 200 mL/hr over 30 Minutes Intravenous Every 6 hours  01/16/19 1412 01/16/19 1438   01/16/19 1445  piperacillin-tazobactam (ZOSYN) IVPB 3.375 g     3.375 g 100 mL/hr over 30 Minutes Intravenous  Once 01/16/19 1439 01/16/19 1652   01/16/19 1445  vancomycin (VANCOCIN) 1,500 mg in sodium chloride 0.9 % 500 mL IVPB     1,500 mg 250 mL/hr over 120 Minutes Intravenous  Once 01/16/19 1446 01/16/19 1809   01/16/19 1430  Ampicillin-Sulbactam (UNASYN) 3 g in sodium chloride 0.9 % 100 mL IVPB  Status:  Discontinued     3 g 200 mL/hr over 30 Minutes Intravenous  Once 01/16/19 1412 01/16/19 1438   01/16/19 1415  vancomycin (VANCOCIN) IVPB 1000 mg/200 mL premix     1,000 mg 200 mL/hr over 60 Minutes Intravenous  Once 01/16/19 1401 01/16/19 1610       Assessment/Plan: s/p Procedure(s): IRRIGATION AND DEBRIDEMENT RIGHT FOOT (Right) Assessment: Stable status post ray resections right foot.   Plan: Saline wet-to-dry gauze packed within the open area of the wound followed by a bulky sterile bandage.  Overall the patient appears stable and I think she should be okay for discharge.  She will need home health care for dressing changes at least every other day.  For now would recommend saline wet-to-dry gauze packed within the wound.  Follow-up with Dr. Ether GriffinsFowler next week for reevaluation.  LOS: 5 days    Ricci Barkerodd W [employer] 01/21/2019

## 2019-01-21 NOTE — Progress Notes (Signed)
PT Cancellation Note  Patient Details Name: Katie Gilbert MRN: [id number] DOB: 1991-08-22   Cancelled Treatment:    Reason Eval/Treat Not Completed: Other (comment)  Attempted again this am.  Pt states she has not stairs to enter home - 1st floor apartment.  Reports feeling comfortable with mobility skills at this time.  Encouraged walker use at home and educated on knee walker options.  Voiced understanding.    Danielle Dess 01/21/2019, 10:58 AM

## 2019-01-21 NOTE — Progress Notes (Signed)
Discharge summary reviewed with verbal understanding. Rxs reviewed and given. Awaiting her mom for transportation. PICC line covered

## 2019-01-21 NOTE — Care Management (Signed)
Called Dr. Ether GriffinsFowler at [phone] and [facility], they are unable to do the Outpatient PICC line dressing change and labs due to not having the supplies at the office. They requested to check with Infectious disease to possible have done in that office setting

## 2019-01-21 NOTE — Discharge Summary (Signed)
[place] at [place] NAME: Katie Gilbert    MR#:  [id number]  DATE OF BIRTH:  February 17, 1991  DATE OF ADMISSION:  01/16/2019 ADMITTING PHYSICIAN: Henreitta Leber, MD  DATE OF DISCHARGE: 01/21/2019   PRIMARY CARE PHYSICIAN: Patient, No Pcp Per    ADMISSION DIAGNOSIS:  Diabetic foot infection ([place]) [E95.284, L08.9] Osteomyelitis of right foot, unspecified type ([place]) [M86.9]  DISCHARGE DIAGNOSIS:  Active Problems:   Diabetic foot ulcer ([place])   SECONDARY DIAGNOSIS:   Past Medical History:  Diagnosis Date  . Genital herpes     HOSPITAL COURSE:   28 year old female with past medical history of diabetes who presents to the hospital due to right foot pain swelling and redness.  1. 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 alsoIncision and drainage deep webspace abscess right forefoot by Dr Vickki Muff 01/17/19 Suggested to call ID consult to decide type and length of antibiotic therapy ID suggested to continue ceftriaxone IV for [redacted] weeks along with oral metronidazole and follow-up in the clinic for the bone/surgical cultures.  2. Sepsis present on admission-secondary to right foot osteomyelitis/diabetic foot ulcer.  -Continue IV fluids, IV antibiotics as mentioned above. Follow cultures.   3. Leukocytosis-secondary to the infection -Follow white cell count with IV antibiotic therapy.   4. Diabetes type 1-patient is noncompliant has not taken the medications for a long time. - A1C 12.3% on 01/16/19  - increase lantus to 25 units SQ qhs - continue SSI, blood sugar fairly under control now. -Her blood sugar was running on the lower sides decreased Lantus to 15 units daily and added some metformin.  Counseled for dietary control.  Advised to get a glucose check and check blood sugar 3 times  daily.    DISCHARGE CONDITIONS:   Stable  CONSULTS OBTAINED:  Treatment Team:  Tsosie Billing, MD  DRUG ALLERGIES:  No Known Allergies  DISCHARGE MEDICATIONS:   Allergies as of 01/21/2019   No Known Allergies     Medication List    TAKE these medications   acetaminophen 325 MG tablet Commonly known as:  TYLENOL Take 2 tablets (650 mg total) by mouth every 6 (six) hours as needed for mild pain (or Fever >/= 101).   ascorbic acid 500 MG tablet Commonly known as:  VITAMIN C Take 1 tablet (500 mg total) by mouth 2 (two) times daily.   blood glucose meter kit and supplies Kit Dispense based on patient and insurance preference. Use up to four times daily as directed. (FOR ICD-9 250.00, 250.01).   cefTRIAXone  IVPB Commonly known as:  ROCEPHIN Inject 2 g into the vein daily for 13 days. Indication: Diabetic foot infection -rt foot/osteomyelitis Last Day of Therapy:  02/03/19 Labs - Once weekly:  CBC/D and BMP, Labs - Every other week:  ESR and CRP Notes to patient:  Every 24 hours   ferrous sulfate 325 (65 FE) MG tablet Take 1 tablet (325 mg total) by mouth 2 (two) times daily with a meal.   insulin glargine 100 UNIT/ML injection Commonly known as:  LANTUS Inject 0.15 mLs (15 Units total) into the skin at bedtime.   Insulin Pen Needle 29G X 5MM Misc To use daily.   Insulin Syringe 27G X 1/2" 1 ML Misc 1 Act by Does not apply route every morning.   metFORMIN 500 MG tablet Commonly known  as:  GLUCOPHAGE Take 1 tablet (500 mg total) by mouth 2 (two) times daily with a meal.   metroNIDAZOLE 500 MG tablet Commonly known as:  FLAGYL Take 1 tablet (500 mg total) by mouth every 12 (twelve) hours for 14 days. Notes to patient:  Every 12 hours   ondansetron 4 MG disintegrating tablet Commonly known as:  ZOFRAN ODT Take 1 tablet (4 mg total) by mouth every 8 (eight) hours as needed for nausea or vomiting.   oxyCODONE-acetaminophen 5-325 MG tablet Commonly  known as:  PERCOCET/ROXICET Take 1-2 tablets by mouth every 6 (six) hours as needed for moderate pain.            Home Infusion Instuctions  (From admission, onward)         Start     Ordered   01/20/19 0000  Home infusion instructions [facility] May follow [place] Dosing Protocol; May administer Cathflo as needed to maintain patency of vascular access device.; Flushing of vascular access device: per [facility] Protocol: 0.9% NaCl pre/post medica...    Question Answer Comment  Instructions May follow [place] Dosing Protocol   Instructions May administer Cathflo as needed to maintain patency of vascular access device.   Instructions Flushing of vascular access device: per [facility] Protocol: 0.9% NaCl pre/post medication administration and prn patency; Heparin 100 u/ml, 68m for implanted ports and Heparin 10u/ml, 574mfor all other central venous catheters.   Instructions May follow [facility] Anaphylaxis Protocol for First Dose Administration in the home: 0.9% NaCl at 25-50 ml/hr to maintain IV access for protocol meds. Epinephrine 0.3 ml IV/IM PRN and Benadryl 25-50 IV/IM PRN s/s of anaphylaxis.   Instructions [facility] Infusion Coordinator (RN) to assist per patient IV care needs in the home PRN.      01/20/19 1328           DISCHARGE INSTRUCTIONS:    Follow with podiatry clinic in 1 week.  If you experience worsening of your admission symptoms, develop shortness of breath, life threatening emergency, suicidal or homicidal thoughts you must seek medical attention immediately by calling 911 or calling your MD immediately  if symptoms less severe.  You Must read complete instructions/literature along with all the possible adverse reactions/side effects for all the Medicines you take and that have been prescribed to you. Take any new Medicines after you have completely understood and accept all the possible adverse reactions/side effects.   Please note  You were cared for  by a hospitalist during your hospital stay. If you have any questions about your discharge medications or the care you received while you were in the hospital after you are discharged, you can call the unit and asked to speak with the hospitalist on call if the hospitalist that took care of you is not available. Once you are discharged, your primary care physician will handle any further medical issues. Please note that NO REFILLS for any discharge medications will be authorized once you are discharged, as it is imperative that you return to your primary care physician (or establish a relationship with a primary care physician if you do not have one) for your aftercare needs so that they can reassess your need for medications and monitor your lab values.    Today   CHIEF COMPLAINT:   Chief Complaint  Patient presents with  . Foot Swelling    right    HISTORY OF PRESENT ILLNESS:  Katie Mccareyis a [age].o. female with  a known history of diabetes type 1 who presents to the hospital due to right foot pain redness and swelling.  Patient says she noticed some swelling in her right foot a few days ago to about a week ago and it has progressively gotten worse.  She started to noticing significant foul-smelling drainage from the right foot and significant pain on ambulation and therefore was a bit concerned and came to the ER for further evaluation.  Patient was ruled in for sepsis given some leukocytosis, tachycardia and also x-rays findings of the right foot suggesting of osteomyelitis/deep tissue infection.  Hospitalist services were contacted for admission.  Patient has a history of diabetes but is not compliant and has not taken any medications in a long time.  She denies any chest pains, nausea, vomiting, fevers, chills, abdominal pain, dysuria hematuria or any other associated symptoms presently.   VITAL SIGNS:  Blood pressure 128/81, pulse 99, temperature 98.1 F (36.7 C), temperature source  Oral, resp. rate 18, height _0  (1.905 m), weight 119 kg, last menstrual period 01/01/2019, SpO2 98 %.  I/O:    Intake/Output Summary (Last 24 hours) at 01/21/2019 0840 Last data filed at 01/20/2019 1558 Gross per 24 hour  Intake -  Output 900 ml  Net -900 ml    PHYSICAL EXAMINATION:   HENT:     Head: Normocephalic and atraumatic.  Eyes:     Conjunctiva/sclera: Conjunctivae normal.     Pupils: Pupils are equal, round, and reactive to light.  Neck:     Musculoskeletal: Normal range of motion and neck supple.     Thyroid: No thyromegaly.     Trachea: No tracheal deviation.  Cardiovascular:     Rate and Rhythm: Normal rate and regular rhythm.     Heart sounds: Normal heart sounds.  Pulmonary:     Effort: Pulmonary effort is normal. No respiratory distress.     Breath sounds: Normal breath sounds. No wheezing.  Chest:     Chest wall: No tenderness.  Abdominal:     General: Bowel sounds are normal. There is no distension.     Palpations: Abdomen is soft.     Tenderness: There is no abdominal tenderness.  Musculoskeletal: Normal range of motion.       Feet:  Feet:     Comments: In dressing Skin:    General: Skin is warm and dry.     Findings: No rash.  Neurological:     Mental Status: She is alert and oriented to person, place, and time.     Cranial Nerves: No cranial nerve deficit.   DATA REVIEW:   CBC Recent Labs  Lab 01/20/19 0420  WBC 7.5  HGB 8.7*  HCT 28.0*  PLT 393    Chemistries  Recent Labs  Lab 01/16/19 1247  01/20/19 0420  NA 129*   < > 140  K 3.6   < > 3.5  CL 95*   < > 107  CO2 22   < > 26  GLUCOSE 355*   < > 82  BUN 8   < > 10  CREATININE 0.60   < > 0.81  CALCIUM 8.6*   < > 8.3*  AST 12*  --   --   ALT 11  --   --   ALKPHOS 110  --   --   BILITOT 0.9  --   --    < > = values in this interval not displayed.    Cardiac Enzymes  No results for input(s): TROPONINI in the last 168 hours.  Microbiology Results  Results for orders  placed or performed during the hospital encounter of 01/16/19  Urine Culture     Status: Abnormal   Collection Time: 01/16/19  3:00 PM  Result Value Ref Range Status   Specimen Description   Final    URINE, RANDOM Performed at [facility], [address] [place], [place] [address]    Special Requests   Final    NONE Performed at [facility], [address] [place], [place] [address]    Culture MULTIPLE SPECIES PRESENT, SUGGEST RECOLLECTION (A)  Final   Report Status 01/19/2019 FINAL  Final  Aerobic/Anaerobic Culture (surgical/deep wound)     Status: None (Preliminary result)   Collection Time: 01/17/19  1:40 PM  Result Value Ref Range Status   Specimen Description   Final    WOUND Performed at [facility], [address], [place], [place] [address]    Special Requests   Final    NONE Performed at [facility], [address] [place], [place] [address]    Gram Stain   Final    FEW WBC PRESENT, PREDOMINANTLY PMN FEW GRAM POSITIVE COCCI RARE GRAM POSITIVE RODS    Culture   Final    FEW GROUP B STREP(S.AGALACTIAE)ISOLATED TESTING AGAINST S. AGALACTIAE NOT ROUTINELY PERFORMED DUE TO PREDICTABILITY OF AMP/PEN/VAN SUSCEPTIBILITY. FEW PREVOTELLA BIVIA BETA LACTAMASE POSITIVE Performed at [facility], [place] [address] [place], [place] [address]    Report Status PENDING  Incomplete    RADIOLOGY:  Dg Abd 1 View  Result Date: 01/20/2019 CLINICAL DATA:  Vomiting since this morning which has continued throughout the day EXAM: ABDOMEN - 1 VIEW COMPARISON:  None FINDINGS: Nonobstructive bowel gas pattern. Scattered normal stool throughout colon to rectum. Small amount gas within stomach. No bowel dilatation or bowel wall thickening. Osseous structures unremarkable. No urinary tract calcification. Questionable infiltrate at RIGHT lung base. IMPRESSION: Normal bowel gas pattern. Questionable RIGHT basilar pulmonary infiltrate. Electronically  Signed   By: Lavonia Dana M.D.   On: 01/20/2019 16:54   [place] Ekg Site Rite  Result Date: 01/20/2019 If Site Rite image not attached, placement could not be confirmed due to current cardiac rhythm.   EKG:  No orders found for this or any previous visit.    Management plans discussed with the patient, family and they are in agreement.  CODE STATUS:     Code Status Orders  (From admission, onward)         Start     Ordered   01/16/19 1559  Full code  Continuous     01/16/19 1558        Code Status History    This patient has a current code status but no historical code status.      TOTAL TIME TAKING CARE OF THIS PATIENT: 35 minutes.    Vaughan Basta M.D on 01/21/2019 at 8:40 AM  Between 7am to 6pm - Pager - [phone]  After 6pm go to [email or web address] - password [email or web address] [place] [facility]  Office  [phone]  CC: Primary care physician; Patient, No Pcp Per   Note: This dictation was prepared with Dragon dictation along with smaller phrase technology. Any transcriptional errors that result from this process are unintentional.

## 2019-01-21 NOTE — Progress Notes (Signed)
PT Cancellation Note  Patient Details Name: [name] MRN: [id number] DOB: 1991/10/26   Cancelled Treatment:    Reason Eval/Treat Not Completed: Other (comment)   Offered and encouraged PT x 2 this am.   She declined x 1 as she was awaiting breakfast, x 1 due to nausea.     Danielle Dess 01/21/2019, 10:29 AM

## 2019-01-27 ENCOUNTER — Encounter: Payer: Self-pay | Admitting: Infectious Diseases

## 2019-02-03 ENCOUNTER — Encounter: Payer: Self-pay | Admitting: Infectious Diseases

## 2019-02-04 ENCOUNTER — Encounter: Payer: Self-pay | Admitting: Infectious Diseases

## 2019-02-04 ENCOUNTER — Ambulatory Visit (HOSPITAL_BASED_OUTPATIENT_CLINIC_OR_DEPARTMENT_OTHER): Payer: PRIVATE HEALTH INSURANCE | Admitting: Infectious Diseases

## 2019-02-04 VITALS — BP 150/106 | HR 104 | Temp 97.4°F | Ht 75.0 in | Wt 260.0 lb

## 2019-02-04 DIAGNOSIS — L089 Local infection of the skin and subcutaneous tissue, unspecified: Principal | ICD-10-CM

## 2019-02-04 DIAGNOSIS — Z792 Long term (current) use of antibiotics: Secondary | ICD-10-CM

## 2019-02-04 DIAGNOSIS — E1152 Type 2 diabetes mellitus with diabetic peripheral angiopathy with gangrene: Secondary | ICD-10-CM | POA: Diagnosis not present

## 2019-02-04 DIAGNOSIS — D638 Anemia in other chronic diseases classified elsewhere: Secondary | ICD-10-CM

## 2019-02-04 DIAGNOSIS — I96 Gangrene, not elsewhere classified: Secondary | ICD-10-CM | POA: Diagnosis not present

## 2019-02-04 DIAGNOSIS — E1169 Type 2 diabetes mellitus with other specified complication: Secondary | ICD-10-CM

## 2019-02-04 DIAGNOSIS — M869 Osteomyelitis, unspecified: Secondary | ICD-10-CM

## 2019-02-04 DIAGNOSIS — E11628 Type 2 diabetes mellitus with other skin complications: Secondary | ICD-10-CM

## 2019-02-04 DIAGNOSIS — Z794 Long term (current) use of insulin: Secondary | ICD-10-CM

## 2019-02-04 DIAGNOSIS — Z95828 Presence of other vascular implants and grafts: Secondary | ICD-10-CM

## 2019-02-04 DIAGNOSIS — Z89421 Acquired absence of other right toe(s): Secondary | ICD-10-CM

## 2019-02-04 MED ORDER — AMOXICILLIN-POT CLAVULANATE 875-125 MG PO TABS: 1.0000 | ORAL_TABLET | Freq: Two times a day (BID) | ORAL | 1 refills | Status: DC

## 2019-02-04 NOTE — Progress Notes (Signed)
NAME: Katie Gilbert  DOB: 11/28/1991  MRN: [id number]  Date/Time: 02/04/2019 11:32 AM  Subjective:  Follow up after hospital discharge ? Katie Gilbert is a 28 y.o. female with a history of IDDM , was recently in [facility] for gangenous infection of the rt toes   She underwent 4th/5th ray excision on 01/17/19 and culture was GBS and prevotella .Pathology showed osteomyelitis and clearance of the bone margin of the 4th but extension to the 5th- she has bene on ceftriaxone and oral flagyl and has completed 18 days of IV She is doing well, and wound healing well No fever or chills, no diarrhea Saw Dr.Fowler last week and will be seeing him soon for suture removal. She is here today with her mom Past Medical History:  Diagnosis Date  . Genital herpes     Past Surgical History:  Procedure Laterality Date  . I&D EXTREMITY Right 01/17/2019   Procedure: IRRIGATION AND DEBRIDEMENT RIGHT FOOT;  Surgeon: Samara Deist, DPM;  Location: [facility] ORS;  Service: Podiatry;  Laterality: Right;    Sh Lives with her mom Non smoker No alcoholo  Family History  Problem Relation Age of Onset  . Hypertension Father   . Heart attack Father   . Diabetes Father   . Diabetes Maternal Grandmother    No Known Allergies  ? Current Outpatient Medications  Medication Sig Dispense Refill  . acetaminophen (TYLENOL) 325 MG tablet Take 2 tablets (650 mg total) by mouth every 6 (six) hours as needed for mild pain (or Fever >/= 101). 20 tablet 0  . blood glucose meter kit and supplies KIT Dispense based on patient and insurance preference. Use up to four times daily as directed. (FOR ICD-9 250.00, 250.01). 1 each 0  . insulin glargine (LANTUS) 100 UNIT/ML injection Inject 0.15 mLs (15 Units total) into the skin at bedtime. 10 mL 11  . Insulin Pen Needle 29G X 5MM MISC To use daily. 100 each 0  . Insulin Syringe 27G X 1/2" 1 ML MISC 1 Act by Does not apply route every morning. 100 each 0  . metFORMIN (GLUCOPHAGE) 500  MG tablet Take 1 tablet (500 mg total) by mouth 2 (two) times daily with a meal. 60 tablet 11  . oxyCODONE-acetaminophen (PERCOCET/ROXICET) 5-325 MG tablet Take 1-2 tablets by mouth every 6 (six) hours as needed for moderate pain. 15 tablet 0  . vitamin C (VITAMIN C) 500 MG tablet Take 1 tablet (500 mg total) by mouth 2 (two) times daily. 60 tablet 0  . ferrous sulfate 325 (65 FE) MG tablet Take 1 tablet (325 mg total) by mouth 2 (two) times daily with a meal. (Patient not taking: Reported on 02/04/2019) 60 tablet 3  . ondansetron (ZOFRAN ODT) 4 MG disintegrating tablet Take 1 tablet (4 mg total) by mouth every 8 (eight) hours as needed for nausea or vomiting. (Patient not taking: Reported on 02/04/2019) 20 tablet 0   No current facility-administered medications for this visit.      Abtx:  Anti-infectives (From admission, onward)   None      REVIEW OF SYSTEMS:  Const: negative fever, negative chills, negative weight loss Eyes: negative diplopia or visual changes, negative eye pain ENT: negative coryza, negative sore throat Resp: negative cough, hemoptysis, dyspnea Cards: negative for chest pain, palpitations, lower extremity edema GU: negative for frequency, dysuria and hematuria GI: Negative for abdominal pain, diarrhea, bleeding, constipation Skin: negative for rash and pruritus Heme: negative for easy bruising and gum/nose bleeding  MS: negative for myalgias, arthralgias, back pain and muscle weakness Neurolo:negative for headaches, dizziness, vertigo, memory problems  Psych: negative for feelings of anxiety, depression  Endocrine: negative for thyroid, diabetes Allergy/Immunology- negative for any medication or food allergies  Objective:  VITALS:  BP (!) 150/106 (BP Location: Left Arm, Patient Position: Sitting, Cuff Size: Large)   Pulse (!) 104   Temp (!) 97.4 F (36.3 C) (Oral)   Ht '6\' 3"'  (1.905 m)   Wt 260 lb (117.9 kg)   BMI 32.50 kg/m  PHYSICAL EXAM:  General: Alert,  cooperative, no distress, appears stated age.  Head: Normocephalic, without obvious abnormality, atraumatic. Eyes: Conjunctivae clear, anicteric sclerae. Pupils are equal ENT Nares normal. No drainage or sinus tenderness. Lips, mucosa, and tongue normal. No Thrush Neck: Supple, symmetrical, no adenopathy, thyroid: non tender no carotid bruit and no JVD. Back: No CVA tenderness. Lungs: Clear to auscultation bilaterally. No Wheezing or Rhonchi. No rales. Heart: Regular rate and rhythm, no murmur, rub or gallop. Abdomen: Soft, non-tender,not distended. Bowel sounds normal. No masses Extremities:  Wound healing well- sutures in place  Today 02/04/19   01/20/19   Before surgery   Rt PICC- has biopatch, no net to old the Line- she is using a cut sock Skin: No rashes or lesions. Or bruising Lymph: Cervical, supraclavicular normal. Neurologic: Grossly non-focal Pertinent Labs Lab Results CBC    Component Value Date/Time   WBC 7.5 01/20/2019 0420   RBC 3.23 (L) 01/20/2019 0420   HGB 8.7 (L) 01/20/2019 0420   HGB 13.2 01/22/2015 1537   HCT 28.0 (L) 01/20/2019 0420   HCT 39.5 01/22/2015 1537   PLT 393 01/20/2019 0420   PLT 357 01/22/2015 1537   MCV 86.7 01/20/2019 0420   MCV 83 01/22/2015 1537   MCH 26.9 01/20/2019 0420   MCHC 31.1 01/20/2019 0420   RDW 11.5 01/20/2019 0420   RDW 13.3 01/22/2015 1537   LYMPHSABS 1.3 01/16/2019 1247   LYMPHSABS 1.5 01/22/2015 1537   MONOABS 1.1 (H) 01/16/2019 1247   MONOABS 0.6 01/22/2015 1537   EOSABS 0.1 01/16/2019 1247   EOSABS 0.1 01/22/2015 1537   BASOSABS 0.1 01/16/2019 1247   BASOSABS 0.1 01/22/2015 1537    CMP Latest Ref Rng & Units 01/20/2019 01/19/2019 01/18/2019  Glucose 70 - 99 mg/dL 82 - 228(H)  BUN 6 - 20 mg/dL 10 - 12  Creatinine 0.44 - 1.00 mg/dL 0.81 0.69 0.86  Sodium 135 - 145 mmol/L 140 - 136  Potassium 3.5 - 5.1 mmol/L 3.5 - 3.3(L)  Chloride 98 - 111 mmol/L 107 - 104  CO2 22 - 32 mmol/L 26 - 26  Calcium 8.9 - 10.3 mg/dL  8.3(L) - 7.6(L)  Total Protein 6.5 - 8.1 g/dL - - -  Total Bilirubin 0.3 - 1.2 mg/dL - - -  Alkaline Phos 38 - 126 U/L - - -  AST 15 - 41 U/L - - -  ALT 0 - 44 U/L - - -     Impression/Recommendation ? Diabetic foot infection with gangrenous /necrotic infection of the rt 5th toe and 4th with osteomyelitis- s/p ray amputation of the 5th and 4th toes on 01/17/19- GBS strep and anerobe in the culture- has completed 18 days of iv. Will remove PICCand stop IV ceftriaxone and Po flagyl. Will give augmentin 875/125 for 2 more weeks PO Q 12 as the bone margin of the  5th toe had extension of osteo on pathology. She will take probiotic, she will keep the  foot elevated while sitting. She will follow with Dr.Fowler for removal of stitches ? ?DM- on insulin- she is looking for a PCP- she may go to [facility]  Anemia secondary to chronic disease- ___________________________________________________ Discussed with patient and her mother Will see her if needed

## 2019-02-04 NOTE — Patient Instructions (Signed)
You are here for follow up of rt foot infection- you have been on IV antibiotic and oral flagyl- you can stop them both today and the PICC can be removed- will give Oral augmentin 1 tablet twice a day for 15 days . Follow up with Dr.Fowler You need to find a PCP doctor

## 2019-02-11 ENCOUNTER — Inpatient Hospital Stay: Payer: PRIVATE HEALTH INSURANCE | Admitting: Infectious Diseases

## 2021-04-11 ENCOUNTER — Other Ambulatory Visit: Payer: Self-pay

## 2021-04-11 ENCOUNTER — Encounter: Payer: Self-pay | Admitting: Emergency Medicine

## 2021-04-11 DIAGNOSIS — N3000 Acute cystitis without hematuria: Secondary | ICD-10-CM | POA: Insufficient documentation

## 2021-04-11 DIAGNOSIS — R110 Nausea: Secondary | ICD-10-CM | POA: Diagnosis not present

## 2021-04-11 DIAGNOSIS — R1030 Lower abdominal pain, unspecified: Secondary | ICD-10-CM | POA: Diagnosis present

## 2021-04-11 LAB — COMPREHENSIVE METABOLIC PANEL
ALT: 22 U/L (ref 0–44)
AST: 20 U/L (ref 15–41)
Albumin: 3.3 g/dL — ABNORMAL LOW (ref 3.5–5.0)
Alkaline Phosphatase: 110 U/L (ref 38–126)
Anion gap: 11 (ref 5–15)
BUN: 14 mg/dL (ref 6–20)
CO2: 22 mmol/L (ref 22–32)
Calcium: 8.7 mg/dL — ABNORMAL LOW (ref 8.9–10.3)
Chloride: 97 mmol/L — ABNORMAL LOW (ref 98–111)
Creatinine, Ser: 1.01 mg/dL — ABNORMAL HIGH (ref 0.44–1.00)
GFR, Estimated: >60 mL/min (ref >60)
Glucose, Bld: 409 mg/dL — ABNORMAL HIGH (ref 70–99)
Potassium: 3.9 mmol/L (ref 3.5–5.1)
Sodium: 130 mmol/L — ABNORMAL LOW (ref 135–145)
Total Bilirubin: 0.7 mg/dL (ref 0.3–1.2)
Total Protein: 7.4 g/dL (ref 6.5–8.1)

## 2021-04-11 LAB — CBC WITH DIFFERENTIAL/PLATELET
Abs Immature Granulocytes: 0.04 10*3/uL (ref 0.00–0.07)
Basophils Absolute: 0.1 10*3/uL (ref 0.0–0.1)
Basophils Relative: 1 %
Eosinophils Absolute: 0.1 10*3/uL (ref 0.0–0.5)
Eosinophils Relative: 2 %
HCT: 39.7 % (ref 36.0–46.0)
Hemoglobin: 13.8 g/dL (ref 12.0–15.0)
Immature Granulocytes: 1 %
Lymphocytes Relative: 24 %
Lymphs Abs: 1.7 10*3/uL (ref 0.7–4.0)
MCH: 28.6 pg (ref 26.0–34.0)
MCHC: 34.8 g/dL (ref 30.0–36.0)
MCV: 82.4 fL (ref 80.0–100.0)
Monocytes Absolute: 0.5 10*3/uL (ref 0.1–1.0)
Monocytes Relative: 7 %
Neutro Abs: 4.8 10*3/uL (ref 1.7–7.7)
Neutrophils Relative %: 65 %
Platelets: 326 10*3/uL (ref 150–400)
RBC: 4.82 MIL/uL (ref 3.87–5.11)
RDW: 12 % (ref 11.5–15.5)
WBC: 7.3 10*3/uL (ref 4.0–10.5)
nRBC: 0 % (ref 0.0–0.2)

## 2021-04-11 LAB — LIPASE, BLOOD: Lipase: 27 U/L (ref 11–51)

## 2021-04-11 NOTE — ED Triage Notes (Signed)
Patient ambulatory to triage with steady gait, without difficulty or distress noted; pt reports since Friday having lower abd pain accomp by nausea

## 2021-04-12 ENCOUNTER — Emergency Department
Admission: EM | Admit: 2021-04-12 | Discharge: 2021-04-12 | Disposition: A | Discharge status: Home / Self Care | Payer: Managed Care, Other (non HMO) | Attending: Emergency Medicine | Admitting: Emergency Medicine

## 2021-04-12 DIAGNOSIS — Z794 Long term (current) use of insulin: Secondary | ICD-10-CM

## 2021-04-12 DIAGNOSIS — E119 Type 2 diabetes mellitus without complications: Secondary | ICD-10-CM

## 2021-04-12 DIAGNOSIS — R1030 Lower abdominal pain, unspecified: Secondary | ICD-10-CM

## 2021-04-12 DIAGNOSIS — N3090 Cystitis, unspecified without hematuria: Secondary | ICD-10-CM

## 2021-04-12 DIAGNOSIS — Z89421 Acquired absence of other right toe(s): Secondary | ICD-10-CM

## 2021-04-12 LAB — URINALYSIS, COMPLETE (UACMP) WITH MICROSCOPIC
Bilirubin Urine: NEGATIVE
Glucose, UA: >=500 mg/dL — AB
Ketones, ur: 5 mg/dL — AB
Nitrite: POSITIVE — AB
Protein, ur: 100 mg/dL — AB
Specific Gravity, Urine: 1.021 (ref 1.005–1.030)
Squamous Epithelial / HPF: NONE SEEN (ref 0–5)
WBC, UA: >50 WBC/hpf — ABNORMAL HIGH (ref 0–5)
pH: 5 (ref 5.0–8.0)

## 2021-04-12 LAB — POC URINE PREG, ED: Preg Test, Ur: NEGATIVE

## 2021-04-12 MED ORDER — CEPHALEXIN 500 MG PO CAPS: 500.0000 mg | ORAL_CAPSULE | Freq: Three times a day (TID) | ORAL | 0 refills | Status: DC

## 2021-04-12 NOTE — ED Provider Notes (Signed)
[facility] Emergency Department Provider Note  ____________________________________________  Time seen: Approximately 3:08 AM  I have reviewed the triage vital signs and the nursing notes.   HISTORY  Chief Complaint Abdominal Pain    HPI Katie Gilbert is a 30 y.o. female with no significant past medical history who complains of lower abdominal pain for the past 4 days, gradual onset, waxing and waning, feels like pressure.  Associated nausea but no vomiting, no diarrhea or constipation.  Denies vaginal bleeding or discharge.  Eating normally.  No fever.  Pain is nonradiating, moderate intensity.  No back pain.      Past Medical History:  Diagnosis Date  . Genital herpes      Patient Active Problem List   Diagnosis Date Noted  . Diabetic foot ulcer (HCC) 01/16/2019     Past Surgical History:  Procedure Laterality Date  . FOOT SURGERY Right   . I & D EXTREMITY Right 01/17/2019   Procedure: IRRIGATION AND DEBRIDEMENT RIGHT FOOT;  Surgeon: Fowler, Justin, DPM;  Location: [facility] ORS;  Service: Podiatry;  Laterality: Right;     Prior to Admission medications   Medication Sig Start Date End Date Taking? Authorizing Provider  cephALEXin (KEFLEX) 500 MG capsule Take 1 capsule (500 mg total) by mouth 3 (three) times daily. 04/12/21  Yes Stafford, Phillip, MD  acetaminophen (TYLENOL) 325 MG tablet Take 2 tablets (650 mg total) by mouth every 6 (six) hours as needed for mild pain (or Fever >/= 101). 01/20/19   Vachhani, Vaibhavkumar, MD  blood glucose meter kit and supplies KIT Dispense based on patient and insurance preference. Use up to four times daily as directed. (FOR ICD-9 250.00, 250.01). 01/20/19   Vachhani, Vaibhavkumar, MD  ferrous sulfate 325 (65 FE) MG tablet Take 1 tablet (325 mg total) by mouth 2 (two) times daily with a meal. Patient not taking: Reported on 02/04/2019 01/20/19   Vachhani, Vaibhavkumar, MD  insulin glargine (LANTUS) 100 UNIT/ML  injection Inject 0.15 mLs (15 Units total) into the skin at bedtime. 01/20/19   Vachhani, Vaibhavkumar, MD  Insulin Pen Needle 29G X 5MM MISC To use daily. 01/20/19   Vachhani, Vaibhavkumar, MD  Insulin Syringe 27G X 1/2" 1 ML MISC 1 Act by Does not apply route every morning. 01/20/19   Vachhani, Vaibhavkumar, MD  metFORMIN (GLUCOPHAGE) 500 MG tablet Take 1 tablet (500 mg total) by mouth 2 (two) times daily with a meal. 01/20/19 01/20/20  Vachhani, Vaibhavkumar, MD  ondansetron (ZOFRAN ODT) 4 MG disintegrating tablet Take 1 tablet (4 mg total) by mouth every 8 (eight) hours as needed for nausea or vomiting. Patient not taking: Reported on 02/04/2019 01/21/19   Vachhani, Vaibhavkumar, MD  oxyCODONE-acetaminophen (PERCOCET/ROXICET) 5-325 MG tablet Take 1-2 tablets by mouth every 6 (six) hours as needed for moderate pain. 01/20/19   Vachhani, Vaibhavkumar, MD  vitamin C (VITAMIN C) 500 MG tablet Take 1 tablet (500 mg total) by mouth 2 (two) times daily. 01/20/19   Vachhani, Vaibhavkumar, MD     Allergies Patient has no known allergies.   Family History  Problem Relation Age of Onset  . Hypertension Father   . Heart attack Father   . Diabetes Father   . Diabetes Maternal Grandmother     Social History Social History   Tobacco Use  . Smoking status: Never Smoker  . Smokeless tobacco: Never Used  Vaping Use  . Vaping Use: Never used  Substance Use Topics  . Alcohol use: No  .   Drug use: No    Review of Systems  Constitutional:   No fever or chills.  ENT:   No sore throat. No rhinorrhea. Cardiovascular:   No chest pain or syncope. Respiratory:   No dyspnea or cough. Gastrointestinal: Positive as above for lower abdominal pain without vomiting and diarrhea.  Musculoskeletal:   Negative for focal pain or swelling All other systems reviewed and are negative except as documented above in ROS and HPI.  ____________________________________________   PHYSICAL EXAM:  VITAL SIGNS: ED Triage Vitals   Enc Vitals Group     BP 04/11/21 2320 121/86     Pulse Rate 04/11/21 2320 (!) 113     Resp 04/11/21 2320 16     Temp 04/11/21 2320 98.4 F (36.9 C)     Temp Source 04/11/21 2320 Oral     SpO2 04/11/21 2320 98 %     Weight 04/11/21 2323 280 lb (127 kg)     Height 04/11/21 2323 6' 3" (1.905 m)     Head Circumference --      Peak Flow --      Pain Score 04/11/21 2323 9     Pain Loc --      Pain Edu? --      Excl. in [place]? --     Vital signs reviewed, nursing assessments reviewed.   Constitutional:   Alert and oriented. Non-toxic appearance. Eyes:   Conjunctivae are normal. EOMI. PERRL. ENT      Head:   Normocephalic and atraumatic.      Nose:   Wearing a mask.      Mouth/Throat:   Wearing a mask.      Neck:   No meningismus. Full ROM. Hematological/Lymphatic/Immunilogical:   No cervical lymphadenopathy. Cardiovascular:   RRR. Symmetric bilateral radial and DP pulses.  No murmurs. Cap refill less than 2 seconds. Respiratory:   Normal respiratory effort without tachypnea/retractions. Breath sounds are clear and equal bilaterally. No wheezes/rales/rhonchi. Gastrointestinal:   Soft with suprapubic tenderness. Non distended. There is no CVA tenderness.  No rebound, rigidity, or guarding.  Musculoskeletal:   Normal range of motion in all extremities. No joint effusions.  No lower extremity tenderness.  No edema. Neurologic:   Normal speech and language.  Motor grossly intact. No acute focal neurologic deficits are appreciated.  Skin:    Skin is warm, dry and intact. No rash noted.  No petechiae, purpura, or bullae.  ____________________________________________    LABS (pertinent positives/negatives) (all labs ordered are listed, but only abnormal results are displayed) Labs Reviewed  COMPREHENSIVE METABOLIC PANEL - Abnormal; Notable for the following components:      Result Value   Sodium 130 (*)    Chloride 97 (*)    Glucose, Bld 409 (*)    Creatinine, Ser 1.01 (*)     Calcium 8.7 (*)    Albumin 3.3 (*)    All other components within normal limits  URINALYSIS, COMPLETE (UACMP) WITH MICROSCOPIC - Abnormal; Notable for the following components:   Color, Urine AMBER (*)    APPearance CLOUDY (*)    Glucose, UA >=500 (*)    Hgb urine dipstick MODERATE (*)    Ketones, ur 5 (*)    Protein, ur 100 (*)    Nitrite POSITIVE (*)    Leukocytes,Ua LARGE (*)    WBC, UA >50 (*)    Bacteria, UA MANY (*)    All other components within normal limits  CBC WITH DIFFERENTIAL/PLATELET  LIPASE, BLOOD    POC URINE PREG, ED   ____________________________________________   EKG    ____________________________________________    RADIOLOGY  No results found.  ____________________________________________   PROCEDURES Procedures  ____________________________________________    CLINICAL IMPRESSION / ASSESSMENT AND PLAN / ED COURSE  Medications ordered in the ED: Medications - No data to display  Pertinent labs & imaging results that were available during my care of the patient were reviewed by me and considered in my medical decision making (see chart for details).  [name] was evaluated in Emergency Department on 04/12/2021 for the symptoms described in the history of present illness. She was evaluated in the context of the global COVID-19 pandemic, which necessitated consideration that the patient might be at risk for infection with the SARS-CoV-2 virus that causes COVID-19. Institutional protocols and algorithms that pertain to the evaluation of patients at risk for COVID-19 are in a state of rapid change based on information released by regulatory bodies including the CDC and federal and state organizations. These policies and algorithms were followed during the patient's care in the ED.   Patient presents with lower abdominal pain, exam suggestive of cystitis.  Labs show normal creatinine.  Urinalysis strongly consistent with UTI.  Will treat with  Keflex.  Patient is nontoxic, nonseptic, doubt pyelonephritis or urinary tract obstruction.  Will start Keflex, stable for discharge and outpatient follow-up.      ____________________________________________   FINAL CLINICAL IMPRESSION(S) / ED DIAGNOSES    Final diagnoses:  Lower abdominal pain  Cystitis     ED Discharge Orders         Ordered    cephALEXin (KEFLEX) 500 MG capsule  3 times daily        04/12/21 0307          Portions of this note were generated with dragon dictation software. Dictation errors may occur despite best attempts at proofreading.   Carrie Mew, MD 04/12/21 [phone]

## 2021-04-12 NOTE — ED Notes (Signed)
Pt endorses generalized abd pain since Friday, worsened this AM. +nausea -vomiting -dysuria. AAO NAD VSS. Awaiting HCP evaluation

## 2021-04-12 NOTE — ED Notes (Signed)
poct pregnancy negative. 

## 2021-05-05 ENCOUNTER — Other Ambulatory Visit: Payer: Self-pay

## 2021-05-05 ENCOUNTER — Emergency Department: Payer: Managed Care, Other (non HMO)

## 2021-05-05 ENCOUNTER — Encounter: Payer: Self-pay | Admitting: Emergency Medicine

## 2021-05-05 ENCOUNTER — Emergency Department
Admission: EM | Admit: 2021-05-05 | Discharge: 2021-05-05 | Disposition: A | Discharge status: Home / Self Care | Payer: Managed Care, Other (non HMO) | Source: Home / Self Care | Attending: Emergency Medicine | Admitting: Emergency Medicine

## 2021-05-05 DIAGNOSIS — Z7984 Long term (current) use of oral hypoglycemic drugs: Secondary | ICD-10-CM | POA: Insufficient documentation

## 2021-05-05 DIAGNOSIS — G43909 Migraine, unspecified, not intractable, without status migrainosus: Secondary | ICD-10-CM | POA: Insufficient documentation

## 2021-05-05 DIAGNOSIS — E119 Type 2 diabetes mellitus without complications: Secondary | ICD-10-CM | POA: Insufficient documentation

## 2021-05-05 DIAGNOSIS — N3000 Acute cystitis without hematuria: Secondary | ICD-10-CM | POA: Insufficient documentation

## 2021-05-05 DIAGNOSIS — R112 Nausea with vomiting, unspecified: Secondary | ICD-10-CM | POA: Diagnosis not present

## 2021-05-05 DIAGNOSIS — B9689 Other specified bacterial agents as the cause of diseases classified elsewhere: Secondary | ICD-10-CM | POA: Insufficient documentation

## 2021-05-05 DIAGNOSIS — Z794 Long term (current) use of insulin: Secondary | ICD-10-CM | POA: Insufficient documentation

## 2021-05-05 DIAGNOSIS — N12 Tubulo-interstitial nephritis, not specified as acute or chronic: Secondary | ICD-10-CM | POA: Diagnosis not present

## 2021-05-05 LAB — CBC
HCT: 38.1 % (ref 36.0–46.0)
Hemoglobin: 13.2 g/dL (ref 12.0–15.0)
MCH: 28.6 pg (ref 26.0–34.0)
MCHC: 34.6 g/dL (ref 30.0–36.0)
MCV: 82.5 fL (ref 80.0–100.0)
Platelets: 262 10*3/uL (ref 150–400)
RBC: 4.62 MIL/uL (ref 3.87–5.11)
RDW: 11.9 % (ref 11.5–15.5)
WBC: 8.1 10*3/uL (ref 4.0–10.5)
nRBC: 0 % (ref 0.0–0.2)

## 2021-05-05 LAB — URINALYSIS, COMPLETE (UACMP) WITH MICROSCOPIC
Bilirubin Urine: NEGATIVE
Glucose, UA: >=500 mg/dL — AB
Ketones, ur: 20 mg/dL — AB
Nitrite: POSITIVE — AB
Protein, ur: 100 mg/dL — AB
RBC / HPF: >50 RBC/hpf — ABNORMAL HIGH (ref 0–5)
Specific Gravity, Urine: 1.023 (ref 1.005–1.030)
WBC, UA: >50 WBC/hpf — ABNORMAL HIGH (ref 0–5)
pH: 5 (ref 5.0–8.0)

## 2021-05-05 LAB — COMPREHENSIVE METABOLIC PANEL
ALT: 14 U/L (ref 0–44)
AST: 15 U/L (ref 15–41)
Albumin: 3.5 g/dL (ref 3.5–5.0)
Alkaline Phosphatase: 101 U/L (ref 38–126)
Anion gap: 10 (ref 5–15)
BUN: 14 mg/dL (ref 6–20)
CO2: 23 mmol/L (ref 22–32)
Calcium: 8.5 mg/dL — ABNORMAL LOW (ref 8.9–10.3)
Chloride: 99 mmol/L (ref 98–111)
Creatinine, Ser: 0.87 mg/dL (ref 0.44–1.00)
GFR, Estimated: >60 mL/min (ref >60)
Glucose, Bld: 384 mg/dL — ABNORMAL HIGH (ref 70–99)
Potassium: 4.3 mmol/L (ref 3.5–5.1)
Sodium: 132 mmol/L — ABNORMAL LOW (ref 135–145)
Total Bilirubin: 1.2 mg/dL (ref 0.3–1.2)
Total Protein: 7.7 g/dL (ref 6.5–8.1)

## 2021-05-05 LAB — POC URINE PREG, ED: Preg Test, Ur: NEGATIVE

## 2021-05-05 LAB — LIPASE, BLOOD: Lipase: 22 U/L (ref 11–51)

## 2021-05-05 MED ORDER — SODIUM CHLORIDE 0.9 % IV SOLN
1.0000 g | Freq: Once | INTRAVENOUS | Status: AC
  Administered 2021-05-05: 1 g via INTRAVENOUS
  Filled 2021-05-05: qty 10

## 2021-05-05 MED ORDER — DIPHENHYDRAMINE HCL 50 MG/ML IJ SOLN
25.0000 mg | Freq: Once | INTRAMUSCULAR | Status: AC
  Administered 2021-05-05: 25 mg via INTRAVENOUS
  Filled 2021-05-05: qty 1

## 2021-05-05 MED ORDER — SODIUM CHLORIDE 0.9 % IV BOLUS
500.0000 mL | Freq: Once | INTRAVENOUS | Status: AC
  Administered 2021-05-05: 500 mL via INTRAVENOUS

## 2021-05-05 MED ORDER — DEXAMETHASONE SODIUM PHOSPHATE 10 MG/ML IJ SOLN
8.0000 mg | Freq: Once | INTRAMUSCULAR | Status: AC
  Administered 2021-05-05: 8 mg via INTRAVENOUS
  Filled 2021-05-05: qty 1

## 2021-05-05 MED ORDER — CEPHALEXIN 500 MG PO CAPS: 500.0000 mg | ORAL_CAPSULE | Freq: Two times a day (BID) | ORAL | 0 refills | Status: DC

## 2021-05-05 MED ORDER — KETOROLAC TROMETHAMINE 30 MG/ML IJ SOLN
30.0000 mg | Freq: Once | INTRAMUSCULAR | Status: AC
  Administered 2021-05-05: 30 mg via INTRAVENOUS
  Filled 2021-05-05: qty 1

## 2021-05-05 MED ORDER — BUTALBITAL-APAP-CAFFEINE 50-325-40 MG PO TABS: 1.0000 | ORAL_TABLET | Freq: Four times a day (QID) | ORAL | 0 refills | Status: AC | PRN

## 2021-05-05 MED ORDER — METOCLOPRAMIDE HCL 5 MG/ML IJ SOLN
20.0000 mg | Freq: Once | INTRAVENOUS | Status: AC
  Administered 2021-05-05: 20 mg via INTRAVENOUS
  Filled 2021-05-05: qty 4

## 2021-05-05 MED ORDER — ONDANSETRON HCL 4 MG/2ML IJ SOLN
4.0000 mg | Freq: Once | INTRAMUSCULAR | Status: AC
  Administered 2021-05-05: 4 mg via INTRAVENOUS
  Filled 2021-05-05: qty 2

## 2021-05-05 NOTE — ED Notes (Signed)
States h/a is not any better but has been able to sleep  Describes pain as intermittent pressure pain

## 2021-05-05 NOTE — ED Triage Notes (Signed)
C/O nausea and headache.  States was seen through Urgent Care 2 days ago and diagnosed with GI virus.  AAOx3.  Skin warm and dry. NAD

## 2021-05-05 NOTE — ED Provider Notes (Signed)
[facility] Emergency Department Provider Note   ____________________________________________    I have reviewed the triage vital signs and the nursing notes.   HISTORY  Chief Complaint Headache     HPI AADYA Gilbert is a 30 y.o. female who presents with complaints of headache.  Patient describes global throbbing headache, exacerbated by bright lights.  She reports 2 days ago she was seen in urgent care and diagnosed with a GI illness, she has had some fatigue and body aches as well.  She reports nausea and vomiting better.  She states that she just does not feel good overall.  Denies significant dysuria although some urinary frequency.  Past Medical History:  Diagnosis Date  . Genital herpes     Patient Active Problem List   Diagnosis Date Noted  . Acquired absence of other right toe(s) ([employer]) 04/12/2021  . Diabetic foot ulcer ([place]) 01/16/2019    Past Surgical History:  Procedure Laterality Date  . FOOT SURGERY Right   . I & D EXTREMITY Right 01/17/2019   Procedure: IRRIGATION AND DEBRIDEMENT RIGHT FOOT;  Surgeon: Samara Deist, DPM;  Location: [facility] ORS;  Service: Podiatry;  Laterality: Right;    Prior to Admission medications   Medication Sig Start Date End Date Taking? Authorizing Provider  butalbital-acetaminophen-caffeine (FIORICET) 50-325-40 MG tablet Take 1-2 tablets by mouth every 6 (six) hours as needed for headache. 05/05/21 05/05/22 Yes Lavonia Drafts, MD  cephALEXin (KEFLEX) 500 MG capsule Take 1 capsule (500 mg total) by mouth 2 (two) times daily. 05/05/21  Yes Lavonia Drafts, MD  acetaminophen (TYLENOL) 325 MG tablet Take 2 tablets (650 mg total) by mouth every 6 (six) hours as needed for mild pain (or Fever >/= 101). 01/20/19   Vaughan Basta, MD  blood glucose meter kit and supplies KIT Dispense based on patient and insurance preference. Use up to four times daily as directed. (FOR ICD-9 250.00, 250.01). 01/20/19   Vaughan Basta, MD  insulin glargine (LANTUS) 100 UNIT/ML injection Inject 0.15 mLs (15 Units total) into the skin at bedtime. 01/20/19   Vaughan Basta, MD  Insulin Pen Needle 29G X 5MM MISC To use daily. 01/20/19   Vaughan Basta, MD  Insulin Syringe 27G X 1/2" 1 ML MISC 1 Act by Does not apply route every morning. 01/20/19   Vaughan Basta, MD  metFORMIN (GLUCOPHAGE) 500 MG tablet Take 1 tablet (500 mg total) by mouth 2 (two) times daily with a meal. 01/20/19 01/20/20  Vaughan Basta, MD  ondansetron (ZOFRAN ODT) 4 MG disintegrating tablet Take 1 tablet (4 mg total) by mouth every 8 (eight) hours as needed for nausea or vomiting. Patient not taking: Reported on 02/04/2019 01/21/19   Vaughan Basta, MD  vitamin C (VITAMIN C) 500 MG tablet Take 1 tablet (500 mg total) by mouth 2 (two) times daily. 01/20/19   Vaughan Basta, MD     Allergies Patient has no known allergies.  Family History  Problem Relation Age of Onset  . Hypertension Father   . Heart attack Father   . Diabetes Father   . Diabetes Maternal Grandmother     Social History Social History   Tobacco Use  . Smoking status: Never Smoker  . Smokeless tobacco: Never Used  Vaping Use  . Vaping Use: Never used  Substance Use Topics  . Alcohol use: No  . Drug use: No    Review of Systems  Constitutional: As above Eyes: No visual changes.  ENT: No sore  throat. Cardiovascular: Denies chest pain. Respiratory: Denies shortness of breath. Gastrointestinal: No abdominal pain.   Genitourinary: As above Musculoskeletal: Negative for back pain. Skin: Negative for rash. Neurological: Negative for headaches or weakness   ____________________________________________   PHYSICAL EXAM:  VITAL SIGNS: ED Triage Vitals  Enc Vitals Group     BP 05/05/21 0837 120/78     Pulse Rate 05/05/21 0837 88     Resp 05/05/21 0837 20     Temp 05/05/21 0837 98 F (36.7 C)     Temp Source 05/05/21 0837  Oral     SpO2 05/05/21 0837 98 %     Weight 05/05/21 0836 127 kg (279 lb 15.8 oz)     Height 05/05/21 0807 1.905 m ('6\' 3"' )     Head Circumference --      Peak Flow --      Pain Score 05/05/21 0807 7     Pain Loc --      Pain Edu? --      Excl. in [place]? --     Constitutional: Alert and oriented. No acute distress. Pleasant and interactive  Nose: No congestion/rhinnorhea.  Neck:  Painless ROM Cardiovascular: Normal rate, regular rhythm.  Good peripheral circulation. Respiratory: Normal respiratory effort.  No retractions. Lungs CTAB. Gastrointestinal: Soft and nontender. No distention.  No CVA tenderness.  Reassuring exam  Musculoskeletal: No lower extremity tenderness nor edema.  Warm and well perfused Neurologic:  Normal speech and language. No gross focal neurologic deficits are appreciated.  Skin:  Skin is warm, dry and intact. No rash noted. Psychiatric: Mood and affect are normal. Speech and behavior are normal.  ____________________________________________   LABS (all labs ordered are listed, but only abnormal results are displayed)  Labs Reviewed  COMPREHENSIVE METABOLIC PANEL - Abnormal; Notable for the following components:      Result Value   Sodium 132 (*)    Glucose, Bld 384 (*)    Calcium 8.5 (*)    All other components within normal limits  URINALYSIS, COMPLETE (UACMP) WITH MICROSCOPIC - Abnormal; Notable for the following components:   Color, Urine YELLOW (*)    APPearance TURBID (*)    Glucose, UA >=500 (*)    Hgb urine dipstick LARGE (*)    Ketones, ur 20 (*)    Protein, ur 100 (*)    Nitrite POSITIVE (*)    Leukocytes,Ua MODERATE (*)    RBC / HPF >50 (*)    WBC, UA >50 (*)    Bacteria, UA MANY (*)    All other components within normal limits  LIPASE, BLOOD  CBC  POC URINE PREG, ED   ____________________________________________  EKG  None ____________________________________________  RADIOLOGY  CT head  unremarkable ____________________________________________   PROCEDURES  Procedure(s) performed: No  Procedures   Critical Care performed: No ____________________________________________   INITIAL IMPRESSION / ASSESSMENT AND PLAN / ED COURSE  Pertinent labs & imaging results that were available during my care of the patient were reviewed by me and considered in my medical decision making (see chart for details).  Patient presents with symptoms as noted above, differential includes viral illness causing her headache, new migraine.  She does seem to have a significant urinary tract infection  Will treat with IV Rocephin, IV Toradol Benadryl and Reglan for migraine, CT imaging is reassuring  Pending COVID test.  Patient is feeling much better, appropriate discharge at this time with supportive outpatient care, COVID test pending    ____________________________________________   FINAL  CLINICAL IMPRESSION(S) / ED DIAGNOSES  Final diagnoses:  Migraine without status migrainosus, not intractable, unspecified migraine type  Acute cystitis without hematuria        Note:  This document was prepared using Dragon voice recognition software and may include unintentional dictation errors.   Lavonia Drafts, MD 05/05/21 [phone]

## 2021-05-05 NOTE — ED Notes (Signed)
See triage note  Presents with frontal h/a and "not feeling well"  No fever   States she was seen and dx'd with GI bug couple of days ago  Was given meds for n/v  Tested negative for flu and COVID

## 2021-05-05 NOTE — ED Notes (Signed)
Pharmacy contacted about reglan order per [facility]. Pharmacy reports they will send it through the tube system when ready.

## 2021-05-07 ENCOUNTER — Other Ambulatory Visit: Payer: Self-pay

## 2021-05-07 DIAGNOSIS — E669 Obesity, unspecified: Secondary | ICD-10-CM | POA: Diagnosis present

## 2021-05-07 DIAGNOSIS — Z6835 Body mass index (BMI) 35.0-35.9, adult: Secondary | ICD-10-CM

## 2021-05-07 DIAGNOSIS — Z7984 Long term (current) use of oral hypoglycemic drugs: Secondary | ICD-10-CM

## 2021-05-07 DIAGNOSIS — Z794 Long term (current) use of insulin: Secondary | ICD-10-CM

## 2021-05-07 DIAGNOSIS — Z833 Family history of diabetes mellitus: Secondary | ICD-10-CM

## 2021-05-07 DIAGNOSIS — Z20822 Contact with and (suspected) exposure to covid-19: Secondary | ICD-10-CM | POA: Diagnosis present

## 2021-05-07 DIAGNOSIS — F17210 Nicotine dependence, cigarettes, uncomplicated: Secondary | ICD-10-CM | POA: Diagnosis present

## 2021-05-07 DIAGNOSIS — N12 Tubulo-interstitial nephritis, not specified as acute or chronic: Principal | ICD-10-CM | POA: Diagnosis present

## 2021-05-07 DIAGNOSIS — E1165 Type 2 diabetes mellitus with hyperglycemia: Secondary | ICD-10-CM | POA: Diagnosis present

## 2021-05-07 DIAGNOSIS — Z79899 Other long term (current) drug therapy: Secondary | ICD-10-CM

## 2021-05-07 DIAGNOSIS — Z8249 Family history of ischemic heart disease and other diseases of the circulatory system: Secondary | ICD-10-CM

## 2021-05-07 DIAGNOSIS — G43909 Migraine, unspecified, not intractable, without status migrainosus: Secondary | ICD-10-CM | POA: Diagnosis present

## 2021-05-07 LAB — COMPREHENSIVE METABOLIC PANEL
ALT: 39 U/L (ref 0–44)
AST: 45 U/L — ABNORMAL HIGH (ref 15–41)
Albumin: 3.2 g/dL — ABNORMAL LOW (ref 3.5–5.0)
Alkaline Phosphatase: 93 U/L (ref 38–126)
Anion gap: 15 (ref 5–15)
BUN: 10 mg/dL (ref 6–20)
CO2: 20 mmol/L — ABNORMAL LOW (ref 22–32)
Calcium: 8.5 mg/dL — ABNORMAL LOW (ref 8.9–10.3)
Chloride: 96 mmol/L — ABNORMAL LOW (ref 98–111)
Creatinine, Ser: 0.64 mg/dL (ref 0.44–1.00)
GFR, Estimated: >60 mL/min (ref >60)
Glucose, Bld: 335 mg/dL — ABNORMAL HIGH (ref 70–99)
Potassium: 3.5 mmol/L (ref 3.5–5.1)
Sodium: 131 mmol/L — ABNORMAL LOW (ref 135–145)
Total Bilirubin: 1.5 mg/dL — ABNORMAL HIGH (ref 0.3–1.2)
Total Protein: 7.4 g/dL (ref 6.5–8.1)

## 2021-05-07 LAB — CBC
HCT: 34.3 % — ABNORMAL LOW (ref 36.0–46.0)
Hemoglobin: 12.2 g/dL (ref 12.0–15.0)
MCH: 28.4 pg (ref 26.0–34.0)
MCHC: 35.6 g/dL (ref 30.0–36.0)
MCV: 80 fL (ref 80.0–100.0)
Platelets: 239 10*3/uL (ref 150–400)
RBC: 4.29 MIL/uL (ref 3.87–5.11)
RDW: 11.8 % (ref 11.5–15.5)
WBC: 4.9 10*3/uL (ref 4.0–10.5)
nRBC: 0 % (ref 0.0–0.2)

## 2021-05-07 LAB — LIPASE, BLOOD: Lipase: 22 U/L (ref 11–51)

## 2021-05-07 MED ORDER — ONDANSETRON 4 MG PO TBDP
4.0000 mg | ORAL_TABLET | Freq: Once | ORAL | Status: AC | PRN
  Administered 2021-05-07: 4 mg via ORAL

## 2021-05-07 MED ORDER — ONDANSETRON 4 MG PO TBDP
ORAL_TABLET | ORAL | Status: AC
  Filled 2021-05-07: qty 1

## 2021-05-07 NOTE — ED Triage Notes (Signed)
N/v with general abd pain since this morning, went to [facility] and had neg covid tues, denies diarrhea

## 2021-05-08 ENCOUNTER — Emergency Department: Payer: Managed Care, Other (non HMO)

## 2021-05-08 ENCOUNTER — Encounter: Payer: Self-pay | Admitting: Internal Medicine

## 2021-05-08 ENCOUNTER — Inpatient Hospital Stay
Admission: EM | Admit: 2021-05-08 | Discharge: 2021-05-10 | DRG: 690 | Disposition: A | Discharge status: Home / Self Care | Payer: Managed Care, Other (non HMO) | Attending: Internal Medicine | Admitting: Internal Medicine

## 2021-05-08 ENCOUNTER — Other Ambulatory Visit: Payer: Self-pay

## 2021-05-08 DIAGNOSIS — F17210 Nicotine dependence, cigarettes, uncomplicated: Secondary | ICD-10-CM | POA: Diagnosis present

## 2021-05-08 DIAGNOSIS — E119 Type 2 diabetes mellitus without complications: Secondary | ICD-10-CM

## 2021-05-08 DIAGNOSIS — Z833 Family history of diabetes mellitus: Secondary | ICD-10-CM | POA: Diagnosis not present

## 2021-05-08 DIAGNOSIS — Z794 Long term (current) use of insulin: Secondary | ICD-10-CM

## 2021-05-08 DIAGNOSIS — N12 Tubulo-interstitial nephritis, not specified as acute or chronic: Secondary | ICD-10-CM | POA: Diagnosis present

## 2021-05-08 DIAGNOSIS — R112 Nausea with vomiting, unspecified: Secondary | ICD-10-CM | POA: Diagnosis present

## 2021-05-08 DIAGNOSIS — Z79899 Other long term (current) drug therapy: Secondary | ICD-10-CM | POA: Diagnosis not present

## 2021-05-08 DIAGNOSIS — E1165 Type 2 diabetes mellitus with hyperglycemia: Secondary | ICD-10-CM | POA: Diagnosis present

## 2021-05-08 DIAGNOSIS — Z8249 Family history of ischemic heart disease and other diseases of the circulatory system: Secondary | ICD-10-CM | POA: Diagnosis not present

## 2021-05-08 DIAGNOSIS — Z6835 Body mass index (BMI) 35.0-35.9, adult: Secondary | ICD-10-CM | POA: Diagnosis not present

## 2021-05-08 DIAGNOSIS — Z7984 Long term (current) use of oral hypoglycemic drugs: Secondary | ICD-10-CM | POA: Diagnosis not present

## 2021-05-08 DIAGNOSIS — E1169 Type 2 diabetes mellitus with other specified complication: Secondary | ICD-10-CM | POA: Diagnosis not present

## 2021-05-08 DIAGNOSIS — E669 Obesity, unspecified: Secondary | ICD-10-CM | POA: Diagnosis present

## 2021-05-08 DIAGNOSIS — G43909 Migraine, unspecified, not intractable, without status migrainosus: Secondary | ICD-10-CM | POA: Diagnosis present

## 2021-05-08 DIAGNOSIS — Z20822 Contact with and (suspected) exposure to covid-19: Secondary | ICD-10-CM | POA: Diagnosis present

## 2021-05-08 DIAGNOSIS — R739 Hyperglycemia, unspecified: Secondary | ICD-10-CM

## 2021-05-08 HISTORY — DX: Type 2 diabetes mellitus with other diabetic kidney complication: E11.29

## 2021-05-08 LAB — URINALYSIS, COMPLETE (UACMP) WITH MICROSCOPIC
Bacteria, UA: NONE SEEN
Bilirubin Urine: NEGATIVE
Glucose, UA: >=500 mg/dL — AB
Ketones, ur: 80 mg/dL — AB
Leukocytes,Ua: NEGATIVE
Nitrite: NEGATIVE
Protein, ur: 100 mg/dL — AB
Specific Gravity, Urine: 1.028 (ref 1.005–1.030)
pH: 5 (ref 5.0–8.0)

## 2021-05-08 LAB — CBG MONITORING, ED
Glucose-Capillary: 383 mg/dL — ABNORMAL HIGH (ref 70–99)
Glucose-Capillary: 375 mg/dL — ABNORMAL HIGH (ref 70–99)

## 2021-05-08 LAB — GLUCOSE, CAPILLARY
Glucose-Capillary: 304 mg/dL — ABNORMAL HIGH (ref 70–99)
Glucose-Capillary: 302 mg/dL — ABNORMAL HIGH (ref 70–99)
Glucose-Capillary: 239 mg/dL — ABNORMAL HIGH (ref 70–99)
Glucose-Capillary: 240 mg/dL — ABNORMAL HIGH (ref 70–99)

## 2021-05-08 LAB — RESP PANEL BY RT-PCR (FLU A&B, COVID) ARPGX2
Influenza A by PCR: NEGATIVE
Influenza B by PCR: NEGATIVE
SARS Coronavirus 2 by RT PCR: NEGATIVE

## 2021-05-08 LAB — HEMOGLOBIN A1C
Hgb A1c MFr Bld: 13.2 % — ABNORMAL HIGH (ref 4.8–5.6)
Mean Plasma Glucose: 332.14 mg/dL

## 2021-05-08 LAB — HIV ANTIBODY (ROUTINE TESTING W REFLEX): HIV Screen 4th Generation wRfx: NONREACTIVE

## 2021-05-08 LAB — POC URINE PREG, ED: Preg Test, Ur: NEGATIVE

## 2021-05-08 MED ORDER — LACTATED RINGERS IV BOLUS
1000.0000 mL | Freq: Once | INTRAVENOUS | Status: AC
  Administered 2021-05-08: 1000 mL via INTRAVENOUS

## 2021-05-08 MED ORDER — ACETAMINOPHEN 325 MG PO TABS: 650.0000 mg | ORAL_TABLET | Freq: Four times a day (QID) | ORAL | Status: DC | PRN

## 2021-05-08 MED ORDER — BUTALBITAL-APAP-CAFFEINE 50-325-40 MG PO TABS: 1.0000 | ORAL_TABLET | Freq: Four times a day (QID) | ORAL | Status: DC | PRN

## 2021-05-08 MED ORDER — ONDANSETRON HCL 4 MG/2ML IJ SOLN
4.0000 mg | Freq: Three times a day (TID) | INTRAMUSCULAR | Status: DC | PRN
  Administered 2021-05-08 – 2021-05-09 (×4): 4 mg via INTRAVENOUS
  Filled 2021-05-08 (×4): qty 2

## 2021-05-08 MED ORDER — ONDANSETRON HCL 4 MG/2ML IJ SOLN
4.0000 mg | Freq: Once | INTRAMUSCULAR | Status: AC
  Administered 2021-05-08: 4 mg via INTRAVENOUS
  Filled 2021-05-08: qty 2

## 2021-05-08 MED ORDER — SODIUM CHLORIDE 0.9 % IV SOLN
1.0000 g | Freq: Once | INTRAVENOUS | Status: AC
  Administered 2021-05-08: 10:00:00 1 g via INTRAVENOUS
  Filled 2021-05-08: qty 1

## 2021-05-08 MED ORDER — MORPHINE SULFATE (PF) 2 MG/ML IV SOLN
2.0000 mg | INTRAVENOUS | Status: DC | PRN
  Administered 2021-05-09 – 2021-05-10 (×2): 2 mg via INTRAVENOUS
  Filled 2021-05-08 (×2): qty 1

## 2021-05-08 MED ORDER — SODIUM CHLORIDE 0.9 % IV SOLN
12.5000 mg | Freq: Four times a day (QID) | INTRAVENOUS | Status: DC | PRN
  Administered 2021-05-08 (×2): 12.5 mg via INTRAVENOUS
  Filled 2021-05-08 (×2): qty 12.5

## 2021-05-08 MED ORDER — SODIUM CHLORIDE 0.9 % IV SOLN
25.0000 mg | Freq: Once | INTRAVENOUS | Status: AC
  Administered 2021-05-08: 25 mg via INTRAVENOUS
  Filled 2021-05-08: qty 1

## 2021-05-08 MED ORDER — INSULIN ASPART 100 UNIT/ML IJ SOLN
0.0000 [IU] | Freq: Every day | INTRAMUSCULAR | Status: DC
  Administered 2021-05-08 – 2021-05-09 (×2): 2 [IU] via SUBCUTANEOUS
  Filled 2021-05-08 (×2): qty 1

## 2021-05-08 MED ORDER — SODIUM CHLORIDE 0.9 % IV SOLN
1.0000 g | Freq: Once | INTRAVENOUS | Status: AC
  Administered 2021-05-08: 1 g via INTRAVENOUS
  Filled 2021-05-08: qty 10

## 2021-05-08 MED ORDER — INSULIN GLARGINE 100 UNIT/ML ~~LOC~~ SOLN: 10.0000 [IU] | Freq: Every day | SUBCUTANEOUS | Status: DC

## 2021-05-08 MED ORDER — INSULIN ASPART 100 UNIT/ML IJ SOLN
0.0000 [IU] | Freq: Three times a day (TID) | INTRAMUSCULAR | Status: DC
  Administered 2021-05-08 (×2): 11 [IU] via SUBCUTANEOUS
  Administered 2021-05-08 – 2021-05-09 (×4): 5 [IU] via SUBCUTANEOUS
  Administered 2021-05-10 (×2): 3 [IU] via SUBCUTANEOUS
  Filled 2021-05-08 (×8): qty 1

## 2021-05-08 MED ORDER — ENOXAPARIN SODIUM 80 MG/0.8ML IJ SOSY
0.5000 mg/kg | PREFILLED_SYRINGE | INTRAMUSCULAR | Status: DC
  Administered 2021-05-08 – 2021-05-09 (×2): 62.5 mg via SUBCUTANEOUS
  Filled 2021-05-08 (×3): qty 0.63

## 2021-05-08 MED ORDER — INSULIN GLARGINE 100 UNIT/ML ~~LOC~~ SOLN
10.0000 [IU] | Freq: Every day | SUBCUTANEOUS | Status: DC
  Administered 2021-05-08 – 2021-05-09 (×2): 10 [IU] via SUBCUTANEOUS
  Filled 2021-05-08 (×3): qty 0.1

## 2021-05-08 MED ORDER — KETOROLAC TROMETHAMINE 30 MG/ML IJ SOLN
30.0000 mg | Freq: Once | INTRAMUSCULAR | Status: AC
  Administered 2021-05-08: 30 mg via INTRAVENOUS
  Filled 2021-05-08: qty 1

## 2021-05-08 MED ORDER — HYDRALAZINE HCL 20 MG/ML IJ SOLN
5.0000 mg | INTRAMUSCULAR | Status: DC | PRN
  Administered 2021-05-08 – 2021-05-10 (×2): 5 mg via INTRAVENOUS
  Filled 2021-05-08 (×2): qty 1

## 2021-05-08 MED ORDER — LACTATED RINGERS IV SOLN: INTRAVENOUS | Status: DC

## 2021-05-08 MED ORDER — INSULIN GLARGINE 100 UNIT/ML ~~LOC~~ SOLN
10.0000 [IU] | Freq: Every day | SUBCUTANEOUS | Status: DC
  Filled 2021-05-08: qty 0.1

## 2021-05-08 MED ORDER — INSULIN ASPART 100 UNIT/ML IJ SOLN
5.0000 [IU] | Freq: Once | INTRAMUSCULAR | Status: AC
  Administered 2021-05-08: 5 [IU] via INTRAVENOUS
  Filled 2021-05-08: qty 1

## 2021-05-08 MED ORDER — SODIUM CHLORIDE 0.9 % IV SOLN
2.0000 g | INTRAVENOUS | Status: DC
  Administered 2021-05-09 – 2021-05-10 (×2): 2 g via INTRAVENOUS
  Filled 2021-05-08 (×2): qty 2
  Filled 2021-05-08: qty 20

## 2021-05-08 NOTE — ED Provider Notes (Signed)
  [facility] Emergency Department Provider Note  ____________________________________________   Event Date/Time   First MD Initiated Contact with Patient 05/08/21 0227     (approximate)  I have reviewed the triage vital signs and the nursing notes.   HISTORY  Chief Complaint Emesis    HPI Katie Gilbert is a 30 y.o. female with history of non-insulin-dependent diabetes who presents to the emergency department with diffuse sharp abdominal pain, vomiting that started yesterday.  Was seen here on the 19th for migraine headache and UTI.  States migraine is gone.  She has had 3 doses of Keflex at home but states now that she has vomiting she cannot keep anything down.  No diarrhea.  Is having dysuria.  No flank pain.  No history of kidney stones or kidney infections.  No known fevers.  No previous abdominal surgeries.  No vaginal bleeding or discharge.  No sick contacts or recent travel.      Past Medical History:  Diagnosis Date  . Genital herpes     Patient Active Problem List   Diagnosis Date Noted  . 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 Diabetic foot ulcer (HCC) 01/16/2019    Past Surgical History:  Procedure Laterality Date  . FOOT SURGERY Right   . I & D EXTREMITY Right 01/17/2019   Procedure: IRRIGATION AND DEBRIDEMENT RIGHT FOOT;  Surgeon: Fowler, Justin, DPM;  Location: [facility] ORS;  Service: Podiatry;  Laterality: Right;    Prior to Admission medications   Medication Sig Start Date End Date Taking? Authorizing Provider  acetaminophen (TYLENOL) 325 MG tablet Take 2 tablets (650 mg total) by mouth every 6 (six) hours as needed for mild pain (or Fever >/= 101). 01/20/19   Vachhani, Vaibhavkumar, MD  blood glucose meter kit and supplies KIT Dispense based on patient and insurance preference. Use up to four times daily as directed. (FOR ICD-9 250.00, 250.01). 01/20/19   Vachhani, Vaibhavkumar, MD  butalbital-acetaminophen-caffeine  (FIORICET) 50-325-40 MG tablet Take 1-2 tablets by mouth every 6 (six) hours as needed for headache. 05/05/21 05/05/22  Kinner, Robert, MD  cephALEXin (KEFLEX) 500 MG capsule Take 1 capsule (500 mg total) by mouth 2 (two) times daily. 05/05/21   Kinner, Robert, MD  insulin glargine (LANTUS) 100 UNIT/ML injection Inject 0.15 mLs (15 Units total) into the skin at bedtime. 01/20/19   Vachhani, Vaibhavkumar, MD  Insulin Pen Needle 29G X 5MM MISC To use daily. 01/20/19   Vachhani, Vaibhavkumar, MD  Insulin Syringe 27G X 1/2" 1 ML MISC 1 Act by Does not apply route every morning. 01/20/19   Vachhani, Vaibhavkumar, MD  metFORMIN (GLUCOPHAGE) 500 MG tablet Take 1 tablet (500 mg total) by mouth 2 (two) times daily with a meal. 01/20/19 01/20/20  Vachhani, Vaibhavkumar, MD  ondansetron (ZOFRAN ODT) 4 MG disintegrating tablet Take 1 tablet (4 mg total) by mouth every 8 (eight) hours as needed for nausea or vomiting. Patient not taking: Reported on 02/04/2019 01/21/19   Vachhani, Vaibhavkumar, MD  vitamin C (VITAMIN C) 500 MG tablet Take 1 tablet (500 mg total) by mouth 2 (two) times daily. 01/20/19   Vachhani, Vaibhavkumar, MD    Allergies Patient has no known allergies.  Family History  Problem Relation Age of Onset  . Hypertension Father   . Heart attack Father   . Diabetes Father   . Diabetes Maternal Grandmother     Social History Social History   Tobacco Use  . Smoking status: Never   Smoker  . Smokeless tobacco: Never Used  Vaping Use  . Vaping Use: Never used  Substance Use Topics  . Alcohol use: No  . Drug use: No    Review of Systems Constitutional: No fever. Eyes: No visual changes. ENT: No sore throat. Cardiovascular: Denies chest pain. Respiratory: Denies shortness of breath. Gastrointestinal: + nausea and vomiting Genitourinary: + for dysuria. Musculoskeletal: Negative for back pain. Skin: Negative for rash. Neurological: Negative for focal weakness or  numbness.  ____________________________________________   PHYSICAL EXAM:  VITAL SIGNS: ED Triage Vitals  Enc Vitals Group     BP 05/07/21 2202 (!) 159/101     Pulse Rate 05/07/21 2202 98     Resp 05/07/21 2202 18     Temp 05/07/21 2202 98 F (36.7 C)     Temp Source 05/07/21 2202 Oral     SpO2 05/07/21 2202 97 %     Weight 05/07/21 2042 279 lb 15.8 oz (127 kg)     Height 05/07/21 2042 6' 3" (1.905 m)     Head Circumference --      Peak Flow --      Pain Score 05/07/21 2042 10     Pain Loc --      Pain Edu? --      Excl. in GC? --    CONSTITUTIONAL: Alert and oriented and responds appropriately to questions.  Appears uncomfortable HEAD: Normocephalic EYES: Conjunctivae clear, pupils appear equal, EOM appear intact ENT: normal nose; moist mucous membranes NECK: Supple, normal ROM CARD: RRR; S1 and S2 appreciated; no murmurs, no clicks, no rubs, no gallops RESP: Normal chest excursion without splinting or tachypnea; breath sounds clear and equal bilaterally; no wheezes, no rhonchi, no rales, no hypoxia or respiratory distress, speaking full sentences ABD/GI: Normal bowel sounds; non-distended; soft, non-tender, no rebound, no guarding, no peritoneal signs, no hepatosplenomegaly BACK: The back appears normal, no CVA tenderness EXT: Normal ROM in all joints; no deformity noted, no edema; no cyanosis SKIN: Normal color for age and race; warm; no rash on exposed skin NEURO: Moves all extremities equally PSYCH: The patient's mood and manner are appropriate.  ____________________________________________   LABS (all labs ordered are listed, but only abnormal results are displayed)  Labs Reviewed  COMPREHENSIVE METABOLIC PANEL - Abnormal; Notable for the following components:      Result Value   Sodium 131 (*)    Chloride 96 (*)    CO2 20 (*)    Glucose, Bld 335 (*)    Calcium 8.5 (*)    Albumin 3.2 (*)    AST 45 (*)    Total Bilirubin 1.5 (*)    All other components  within normal limits  CBC - Abnormal; Notable for the following components:   HCT 34.3 (*)    All other components within normal limits  URINALYSIS, COMPLETE (UACMP) WITH MICROSCOPIC - Abnormal; Notable for the following components:   Color, Urine YELLOW (*)    APPearance HAZY (*)    Glucose, UA >=500 (*)    Hgb urine dipstick MODERATE (*)    Ketones, ur 80 (*)    Protein, ur 100 (*)    All other components within normal limits  BLOOD GAS, VENOUS - Abnormal; Notable for the following components:   pCO2, Ven 34 (*)    pO2, Ven 74.0 (*)    Acid-base deficit 4.3 (*)    All other components within normal limits  CBG MONITORING, ED - Abnormal; Notable for the following components:     Glucose-Capillary 383 (*)    All other components within normal limits  URINE CULTURE  RESP PANEL BY RT-PCR (FLU A&B, COVID) ARPGX2  LIPASE, BLOOD  POC URINE PREG, ED  CBG MONITORING, ED   ____________________________________________  EKG   ____________________________________________  RADIOLOGY I, Tylor Courtwright, personally viewed and evaluated these images (plain radiographs) as part of my medical decision making, as well as reviewing the written report by the radiologist.  ED MD interpretation: Right-sided pyelonephritis.  Official radiology report(s): CT ABDOMEN PELVIS WO CONTRAST  Result Date: 05/08/2021 CLINICAL DATA:  30 year old female with abdominal pain onset this morning. Recently negative for COVID-19. EXAM: CT ABDOMEN AND PELVIS WITHOUT CONTRAST TECHNIQUE: Multidetector CT imaging of the abdomen and pelvis was performed following the standard protocol without IV contrast. COMPARISON:  None. FINDINGS: Lower chest: Negative lung bases. Negative visible mediastinum. No pericardial or pleural effusion. Hepatobiliary: Negative noncontrast liver and gallbladder. Pancreas: Negative. Spleen: Negative. Adrenals/Urinary Tract: Normal adrenal glands. Asymmetrically inflamed appearance of the right  kidney (series 2, image 43). No associated hydronephrosis. No nephrolithiasis. Negative noncontrast left kidney. Ureters appear fairly symmetric, and there is no ureteral calculus on the right. Several bilateral pelvic phleboliths are incidentally noted. Unremarkable urinary bladder. Stomach/Bowel: Negative large bowel. Normal appendix on series 2, image 78. Decompressed and negative terminal ileum. No dilated small bowel. Stomach and duodenum appear negative. No free air or free fluid. Vascular/Lymphatic: Normal caliber abdominal aorta. No calcified atherosclerosis. No lymphadenopathy. Reproductive: Negative noncontrast appearance. Other: No pelvic free fluid. Musculoskeletal: Mild degeneration in lower thoracic posterior elements. Hypoplastic or absent spinous process at T11. No acute osseous abnormality identified. IMPRESSION: 1. Inflamed appearance of the noncontrast Right Kidney suspicious for Acute Pyelonephritis. CT Abdomen with IV contrast would confirm. No urinary calculus or obstructive uropathy. The noncontrast left kidney and bladder appear negative. 2. Normal appendix and no acute or inflammatory process identified in the noncontrast CT Abdomen and Pelvis. Electronically Signed   By: Genevie Ann M.D.   On: 05/08/2021 04:27    ____________________________________________   PROCEDURES  Procedure(s) performed (including Critical Care):  Procedures   ____________________________________________   INITIAL IMPRESSION / ASSESSMENT AND PLAN / ED COURSE  As part of my medical decision making, I reviewed the following data within the [place] History obtained from family, Nursing notes reviewed and incorporated, Labs reviewed , Old chart reviewed, Radiograph reviewed , Discussed with admitting physician  and Notes from prior ED visits         Patient here with intractable vomiting and a recent diagnosis of urinary tract infection.  No improvement with oral Zofran.  Will  place IV and give IV fluids, Toradol, Zofran.  She does have hyperglycemia and her bicarb is 20 but normal anion gap.  Will check VBG to ensure no DKA developing.  Will obtain CT scan without contrast to rule out kidney stone, pyelonephritis.  Abdominal exam is relatively benign.  Doubt appendicitis, colitis, diverticulitis, bowel obstruction, perforation, cholecystitis, pancreatitis.  ED PROGRESS  CT scan concerning for right-sided pyelonephritis.  No kidney stone visualized.  Normal-appearing appendix.  Patient continues to be nauseated and dry heaving despite Zofran x2.  Will give Phenergan.  We will continue hydration and recheck her blood sugar.  VBG reassuring and does not appear patient is in DKA.  Given she is not improving with outpatient oral antibiotics, will discuss with hospitalist for admission.   Blood sugar improved to 313 per nurse.  Continues to be hemodynamically stable, afebrile.  No  signs of sepsis at this time.  5:07 AM Discussed patient's case with hospitalist, Dr. Mansy.  I have recommended admission and patient (and family if present) agree with this plan. Admitting physician will place admission orders.   I reviewed all nursing notes, vitals, pertinent previous records and reviewed/interpreted all EKGs, lab and urine results, imaging (as available).   ____________________________________________   FINAL CLINICAL IMPRESSION(S) / ED DIAGNOSES  Final diagnoses:  Nausea and vomiting in adult  Hyperglycemia  Pyelonephritis of right kidney     ED Discharge Orders    None      *Please note:  Katie Gilbert was evaluated in Emergency Department on 05/08/2021 for the symptoms described in the history of present illness. She was evaluated in the context of the global COVID-19 pandemic, which necessitated consideration that the patient might be at risk for infection with the SARS-CoV-2 virus that causes COVID-19. Institutional protocols and algorithms that pertain to  the evaluation of patients at risk for COVID-19 are in a state of rapid change based on information released by regulatory bodies including the CDC and federal and state organizations. These policies and algorithms were followed during the patient's care in the ED.  Some ED evaluations and interventions may be delayed as a result of limited staffing during and the pandemic.*   Note:  This document was prepared using Dragon voice recognition software and may include unintentional dictation errors.   Ward, Kristen N, DO 05/08/21 0508  

## 2021-05-08 NOTE — ED Notes (Signed)
Ct called requesting POC preg

## 2021-05-08 NOTE — Progress Notes (Signed)
PHARMACIST - PHYSICIAN COMMUNICATION  CONCERNING:  Enoxaparin (Lovenox) for DVT Prophylaxis    RECOMMENDATION: Patient was prescribed enoxaparin 40mg  q24 hours for VTE prophylaxis.   Filed Weights   05/07/21 2042  Weight: 127 kg (279 lb 15.8 oz)    Body mass index is 35 kg/m.  Estimated Creatinine Clearance: 161.8 mL/min (by C-G formula based on SCr of 0.64 mg/dL).   Based on [facility] policy patient is candidate for enoxaparin 0.5mg /kg TBW SQ every 24 hours based on BMI being >30.  DESCRIPTION: Pharmacy has adjusted enoxaparin dose per [facility] policy.  Patient is now receiving enoxaparin 62.5 mg every 24 hours    [facility] 05/08/2021 7:31 AM

## 2021-05-08 NOTE — H&P (Addendum)
History and Physical    Katie Gilbert WRU:[id number] DOB: 04/14/1991 DOA: 05/08/2021  Referring MD/NP/PA:   PCP: Patient, No Pcp Per (Inactive)   Patient coming from:  The patient is coming from home.  At baseline, pt is independent for most of ADL.        Chief Complaint: nausea, vomiting, abdominal pain  HPI: Katie Gilbert is a 30 y.o. female with medical history significant of diabetes mellitus, genital herpes, obesity, migraine headache, who presents with nausea, vomiting, abdominal pain.   Patient states that she has been sick for more than 4 days.  Patient was seen in urgent care on 5/19 due to headache and was found to have UTI.  She had positive urine analysis for UTI. Patient was discharged on Keflex for UTI and Floricet for headache.   She states that her migraine headache has resolved. She has had 3 doses of Keflex at home but states now that she has nausea, vomiting and abdominal pain. She cannot keep anything down.  She has had more than 10 times of nonbilious nonbloody vomiting since yesterday.  No diarrhea.  No flank pain.  Abdominal pain is located in the middle abdomen, constant, aching, 10 out of 10 in severity, nonradiating.  Patient denies fever or chills.  No dysuria, burning on urination or increased urinary frequency.  Denies chest pain, cough, shortness of breath.  No unilateral numbness or weakness in extremities.  ED Course: pt was found to have WBC 4.9, negative pregnancy test, lipase 22, negative COVID PCR, urinalysis (hazy appearance, negative leukocyte, negative bacteria, WBC 6-10), electrolytes renal function okay, temperature normal, blood pressure 155/77, heart rate 107 RR 19, oxygen saturation 100% on room air.  CT abdomen/pelvis findings are suggestive for right kidney pyelonephritis.  Patient is admitted to [facility] bed as inpatient.  CT-abd/pelvis 1. Inflamed appearance of the noncontrast Right Kidney suspicious for Acute Pyelonephritis. CT Abdomen  with IV contrast would confirm. No urinary calculus or obstructive uropathy. The noncontrast left kidney and bladder appear negative.  2. Normal appendix and no acute or inflammatory process identified in the noncontrast CT Abdomen and Pelvis.   Review of Systems:   General: no fevers, chills, no body weight gain, has poor appetite, has fatigue HEENT: no blurry vision, hearing changes or sore throat Respiratory: no dyspnea, coughing, wheezing CV: no chest pain, no palpitations GI: has nausea, vomiting, abdominal pain, no diarrhea, constipation GU: no dysuria, burning on urination, increased urinary frequency, hematuria  Ext: no leg edema Neuro: no unilateral weakness, numbness, or tingling, no vision change or hearing loss Skin: no rash, no skin tear. MSK: No muscle spasm, no deformity, no limitation of range of movement in spin Heme: No easy bruising.  Travel history: No recent long distant travel.  Allergy: No Known Allergies  Past Medical History:  Diagnosis Date  . Genital herpes   . Type II diabetes mellitus with renal manifestations [facility])     Past Surgical History:  Procedure Laterality Date  . FOOT SURGERY Right   . I & D EXTREMITY Right 01/17/2019   Procedure: IRRIGATION AND DEBRIDEMENT RIGHT FOOT;  Surgeon: Samara Deist, DPM;  Location: [facility] ORS;  Service: Podiatry;  Laterality: Right;    Social History:  reports that she has been smoking. She has been smoking about 0.20 packs per day. She has never used smokeless tobacco. She reports that she does not drink alcohol and does not use drugs.  Family History:  Family History  Problem Relation  Age of Onset  . Hypertension Father   . Heart attack Father   . Diabetes Father   . Diabetes Maternal Grandmother      Prior to Admission medications   Medication Sig Start Date End Date Taking? Authorizing Provider  acetaminophen (TYLENOL) 325 MG tablet Take 2 tablets (650 mg total) by mouth every 6 (six) hours as  needed for mild pain (or Fever >/= 101). 01/20/19  Yes Vaughan Basta, MD  butalbital-acetaminophen-caffeine [facility]) [phone] MG tablet Take 1-2 tablets by mouth every 6 (six) hours as needed for headache. 05/05/21 05/05/22 Yes Lavonia Drafts, MD  cephALEXin (KEFLEX) 500 MG capsule Take 1 capsule (500 mg total) by mouth 2 (two) times daily. 05/05/21  Yes Lavonia Drafts, MD  promethazine (PHENERGAN) 25 MG tablet Take 25 mg by mouth every 6 (six) hours as needed. 05/03/21  Yes [provider]  blood glucose meter kit and supplies KIT Dispense based on patient and insurance preference. Use up to four times daily as directed. (FOR ICD-9 250.00, 250.01). Patient not taking: Reported on 05/08/2021 01/20/19   Vaughan Basta, MD  insulin glargine (LANTUS) 100 UNIT/ML injection Inject 0.15 mLs (15 Units total) into the skin at bedtime. Patient not taking: Reported on 05/08/2021 01/20/19   Vaughan Basta, MD  Insulin Pen Needle 29G X 5MM MISC To use daily. Patient not taking: Reported on 05/08/2021 01/20/19   Vaughan Basta, MD  Insulin Syringe 27G X 1/2" 1 ML MISC 1 Act by Does not apply route every morning. Patient not taking: Reported on 05/08/2021 01/20/19   Vaughan Basta, MD  metFORMIN (GLUCOPHAGE) 500 MG tablet Take 1 tablet (500 mg total) by mouth 2 (two) times daily with a meal. 01/20/19 01/20/20  Vaughan Basta, MD  ondansetron (ZOFRAN ODT) 4 MG disintegrating tablet Take 1 tablet (4 mg total) by mouth every 8 (eight) hours as needed for nausea or vomiting. Patient not taking: No sig reported 01/21/19   Vaughan Basta, MD  vitamin C (VITAMIN C) 500 MG tablet Take 1 tablet (500 mg total) by mouth 2 (two) times daily. Patient not taking: Reported on 05/08/2021 01/20/19   Vaughan Basta, MD    Physical Exam: Vitals:   05/08/21 0253 05/08/21 0500 05/08/21 0531 05/08/21 0627  BP: (!) 166/103 (!) 169/96  (!) 155/77  Pulse: 100 (!) 102  (!) 107   Resp: 18   19  Temp:   99 F (37.2 C) 98.6 F (37 C)  TempSrc:    Oral  SpO2: 99% 98%  100%  Weight:      Height:       General: Not in acute distress HEENT:       Eyes: PERRL, EOMI, no scleral icterus.       ENT: No discharge from the ears and nose, no pharynx injection, no tonsillar enlargement.        Neck: No JVD, no bruit, no mass felt. Heme: No neck lymph node enlargement. Cardiac: S1/S2, RRR, No murmurs, No gallops or rubs. Respiratory: No rales, wheezing, rhonchi or rubs. GI: Soft, nondistended, has tenderness in central abdomen, no rebound pain, no organomegaly, BS present. GU: No hematuria Ext: No pitting leg edema bilaterally. 1+DP/PT pulse bilaterally. Musculoskeletal: No joint deformities, No joint redness or warmth, no limitation of ROM in spin. Skin: No rashes.  Neuro: Alert, oriented X3, cranial nerves II-XII grossly intact, moves all extremities normally.  Psych: Patient is not psychotic, no suicidal or hemocidal ideation.  Labs on Admission: I have personally reviewed  following labs and imaging studies  CBC: Recent Labs  Lab 05/05/21 0842 05/07/21 2045  WBC 8.1 4.9  HGB 13.2 12.2  HCT 38.1 34.3*  MCV 82.5 80.0  PLT 262 937   Basic Metabolic Panel: Recent Labs  Lab 05/05/21 0842 05/07/21 2045  NA 132* 131*  K 4.3 3.5  CL 99 96*  CO2 23 20*  GLUCOSE 384* 335*  BUN 14 10  CREATININE 0.87 0.64  CALCIUM 8.5* 8.5*   GFR: Estimated Creatinine Clearance: 161.8 mL/min (by C-G formula based on SCr of 0.64 mg/dL). Liver Function Tests: Recent Labs  Lab 05/05/21 0842 05/07/21 2045  AST 15 45*  ALT 14 39  ALKPHOS 101 93  BILITOT 1.2 1.5*  PROT 7.7 7.4  ALBUMIN 3.5 3.2*   Recent Labs  Lab 05/05/21 0842 05/07/21 2045  LIPASE 22 22   No results for input(s): AMMONIA in the last 168 hours. Coagulation Profile: No results for input(s): INR, PROTIME in the last 168 hours. Cardiac Enzymes: No results for input(s): CKTOTAL, CKMB, CKMBINDEX,  TROPONINI in the last 168 hours. BNP (last 3 results) No results for input(s): PROBNP in the last 8760 hours. HbA1C: No results for input(s): HGBA1C in the last 72 hours. CBG: Recent Labs  Lab 05/08/21 0306 05/08/21 0447  GLUCAP 383* 375*   Lipid Profile: No results for input(s): CHOL, HDL, LDLCALC, TRIG, CHOLHDL, LDLDIRECT in the last 72 hours. Thyroid Function Tests: No results for input(s): TSH, T4TOTAL, FREET4, T3FREE, THYROIDAB in the last 72 hours. Anemia Panel: No results for input(s): VITAMINB12, FOLATE, FERRITIN, TIBC, IRON, RETICCTPCT in the last 72 hours. Urine analysis:    Component Value Date/Time   COLORURINE YELLOW (A) 05/08/2021 0330   APPEARANCEUR HAZY (A) 05/08/2021 0330   APPEARANCEUR Cloudy 07/15/2014 1806   LABSPEC 1.028 05/08/2021 0330   LABSPEC 1.039 07/15/2014 1806   PHURINE 5.0 05/08/2021 0330   GLUCOSEU >=500 (A) 05/08/2021 0330   GLUCOSEU >=500 07/15/2014 1806   HGBUR MODERATE (A) 05/08/2021 0330   BILIRUBINUR NEGATIVE 05/08/2021 0330   BILIRUBINUR Negative 07/15/2014 1806   KETONESUR 80 (A) 05/08/2021 0330   PROTEINUR 100 (A) 05/08/2021 0330   NITRITE NEGATIVE 05/08/2021 0330   LEUKOCYTESUR NEGATIVE 05/08/2021 0330   LEUKOCYTESUR 3+ 07/15/2014 1806   Sepsis Labs: _0 (procalcitonin:4,lacticidven:4) ) Recent Results (from the past 240 hour(s))  Resp Panel by RT-PCR (Flu A&B, Covid) Nasopharyngeal Swab     Status: None   Collection Time: 05/08/21  4:50 AM   Specimen: Nasopharyngeal Swab; Nasopharyngeal(NP) swabs in vial transport medium  Result Value Ref Range Status   SARS Coronavirus 2 by RT PCR NEGATIVE NEGATIVE Final    Comment: (NOTE) SARS-CoV-2 target nucleic acids are NOT DETECTED.  The SARS-CoV-2 RNA is generally detectable in upper respiratory specimens during the acute phase of infection. The lowest concentration of SARS-CoV-2 viral copies this assay can detect is 138 copies/mL. A negative result does not preclude  SARS-Cov-2 infection and should not be used as the sole basis for treatment or other patient management decisions. A negative result may occur with  improper specimen collection/handling, submission of specimen other than nasopharyngeal swab, presence of viral mutation(s) within the areas targeted by this assay, and inadequate number of viral copies(<138 copies/mL). A negative result must be combined with clinical observations, patient history, and epidemiological information. The expected result is Negative.  Fact Sheet for Patients:  EntrepreneurPulse.com.au  Fact Sheet for Healthcare Providers:  IncredibleEmployment.be  This test is no t yet approved or cleared  by the [place] and  has been authorized for detection and/or diagnosis of SARS-CoV-2 by FDA under an Emergency Use Authorization (EUA). This EUA will remain  in effect (meaning this test can be used) for the duration of the COVID-19 declaration under Section 564(b)(1) of the Act, 21 U.S.C.section 360bbb-3(b)(1), unless the authorization is terminated  or revoked sooner.       Influenza A by PCR NEGATIVE NEGATIVE Final   Influenza B by PCR NEGATIVE NEGATIVE Final    Comment: (NOTE) The Xpert Xpress SARS-CoV-2/FLU/RSV plus assay is intended as an aid in the diagnosis of influenza from Nasopharyngeal swab specimens and should not be used as a sole basis for treatment. Nasal washings and aspirates are unacceptable for Xpert Xpress SARS-CoV-2/FLU/RSV testing.  Fact Sheet for Patients: EntrepreneurPulse.com.au  Fact Sheet for Healthcare Providers: IncredibleEmployment.be  This test is not yet approved or cleared by the [place] FDA and has been authorized for detection and/or diagnosis of SARS-CoV-2 by FDA under an Emergency Use Authorization (EUA). This EUA will remain in effect (meaning this test can be used) for the duration of  the COVID-19 declaration under Section 564(b)(1) of the Act, 21 U.S.C. section 360bbb-3(b)(1), unless the authorization is terminated or revoked.  Performed at [facility], [address] [place], [place] [address]      Radiological Exams on Admission: CT ABDOMEN PELVIS WO CONTRAST  Result Date: 05/08/2021 CLINICAL DATA:  30 year old female with abdominal pain onset this morning. Recently negative for COVID-19. EXAM: CT ABDOMEN AND PELVIS WITHOUT CONTRAST TECHNIQUE: Multidetector CT imaging of the abdomen and pelvis was performed following the standard protocol without IV contrast. COMPARISON:  None. FINDINGS: Lower chest: Negative lung bases. Negative visible mediastinum. No pericardial or pleural effusion. Hepatobiliary: Negative noncontrast liver and gallbladder. Pancreas: Negative. Spleen: Negative. Adrenals/Urinary Tract: Normal adrenal glands. Asymmetrically inflamed appearance of the right kidney (series 2, image 43). No associated hydronephrosis. No nephrolithiasis. Negative noncontrast left kidney. Ureters appear fairly symmetric, and there is no ureteral calculus on the right. Several bilateral pelvic phleboliths are incidentally noted. Unremarkable urinary bladder. Stomach/Bowel: Negative large bowel. Normal appendix on series 2, image 78. Decompressed and negative terminal ileum. No dilated small bowel. Stomach and duodenum appear negative. No free air or free fluid. Vascular/Lymphatic: Normal caliber abdominal aorta. No calcified atherosclerosis. No lymphadenopathy. Reproductive: Negative noncontrast appearance. Other: No pelvic free fluid. Musculoskeletal: Mild degeneration in lower thoracic posterior elements. Hypoplastic or absent spinous process at T11. No acute osseous abnormality identified. IMPRESSION: 1. Inflamed appearance of the noncontrast Right Kidney suspicious for Acute Pyelonephritis. CT Abdomen with IV contrast would confirm. No urinary calculus or obstructive  uropathy. The noncontrast left kidney and bladder appear negative. 2. Normal appendix and no acute or inflammatory process identified in the noncontrast CT Abdomen and Pelvis. Electronically Signed   By: Genevie Ann M.D.   On: 05/08/2021 04:27     EKG:  Not done in ED, will get one.   Assessment/Plan Principal Problem:   Pyelonephritis of right kidney Active Problems:   Diabetes mellitus without complication (HCC)   Migraine headache   Obesity (BMI 35.0-39.9 without comorbidity)   Pyelonephritis of right kidney: Patient's nausea vomiting and abdominal pain are likely due to acute right kidney pyelonephritis as suggested by CT scanning findings.  Patient has tachycardia, but no fever, leukocytosis or tachypnea, clinically does not meet criteria for sepsis.  Patient failed outpatient oral antibiotic Keflex treatment.   - Admit to med-surg bed as an inpatient -  Ceftriaxone by IV, 2 g daily - Follow up results of urine and blood cx and amend antibiotic regimen if needed per sensitivity results - prn Zofran for nausea and morphine for abdominal pain - IVF: 2L LR and then 100 cc/h  Diabetes mellitus without complication [facility]): Recent A1c 12.3, poorly controlled.  Patient is supposed to take metformin and Lantus 15 unit daily, but she states that she is not taking medications.  Blood sugar 335, anion gap 15.  ABG showed pH of 7.38, no DKA. -Start Lantus 10 unit daily -Sliding scale insulin  Migraine headache -as needed Floricet  Obesity (BMI 35.0-39.9 without comorbidity): BMI 35. -Diet and exercise.   -Encourage to lose weight.     DVT ppx:  SQ Lovenox Code Status: Full code Family Communication: not done, no family member is at bed side.   Disposition Plan:  Anticipate discharge back to previous environment Consults called:  none Admission status and Level of care: Med-Surg:  as inpt       Status is: Inpatient  Remains inpatient appropriate because:Inpatient level of care  appropriate due to severity of illness   Dispo: The patient is from: Home              Anticipated d/c is to: Home              Patient currently is not medically stable to d/c.   Difficult to place patient No          Date of Service 05/08/2021    Ivor Costa [facility]   If 7PM-7AM, please contact night-coverage [email or web address] 05/08/2021, 7:58 AM

## 2021-05-08 NOTE — Plan of Care (Signed)

## 2021-05-08 NOTE — ED Notes (Signed)
CT called and aware POC hcg neg

## 2021-05-09 DIAGNOSIS — N12 Tubulo-interstitial nephritis, not specified as acute or chronic: Principal | ICD-10-CM

## 2021-05-09 DIAGNOSIS — E1169 Type 2 diabetes mellitus with other specified complication: Secondary | ICD-10-CM | POA: Diagnosis not present

## 2021-05-09 DIAGNOSIS — E669 Obesity, unspecified: Secondary | ICD-10-CM | POA: Diagnosis not present

## 2021-05-09 LAB — GLUCOSE, CAPILLARY
Glucose-Capillary: 225 mg/dL — ABNORMAL HIGH (ref 70–99)
Glucose-Capillary: 244 mg/dL — ABNORMAL HIGH (ref 70–99)
Glucose-Capillary: 224 mg/dL — ABNORMAL HIGH (ref 70–99)
Glucose-Capillary: 201 mg/dL — ABNORMAL HIGH (ref 70–99)

## 2021-05-09 LAB — CBC
HCT: 33.7 % — ABNORMAL LOW (ref 36.0–46.0)
Hemoglobin: 11.9 g/dL — ABNORMAL LOW (ref 12.0–15.0)
MCH: 28.4 pg (ref 26.0–34.0)
MCHC: 35.3 g/dL (ref 30.0–36.0)
MCV: 80.4 fL (ref 80.0–100.0)
Platelets: 265 10*3/uL (ref 150–400)
RBC: 4.19 MIL/uL (ref 3.87–5.11)
RDW: 11.8 % (ref 11.5–15.5)
WBC: 5.8 10*3/uL (ref 4.0–10.5)
nRBC: 0 % (ref 0.0–0.2)

## 2021-05-09 LAB — BASIC METABOLIC PANEL
Anion gap: 12 (ref 5–15)
BUN: 11 mg/dL (ref 6–20)
CO2: 22 mmol/L (ref 22–32)
Calcium: 8.5 mg/dL — ABNORMAL LOW (ref 8.9–10.3)
Chloride: 101 mmol/L (ref 98–111)
Creatinine, Ser: 0.59 mg/dL (ref 0.44–1.00)
GFR, Estimated: >60 mL/min (ref >60)
Glucose, Bld: 247 mg/dL — ABNORMAL HIGH (ref 70–99)
Potassium: 3.6 mmol/L (ref 3.5–5.1)
Sodium: 135 mmol/L (ref 135–145)

## 2021-05-09 LAB — URINE CULTURE: Culture: NO GROWTH

## 2021-05-09 MED ORDER — INSULIN GLARGINE 100 UNIT/ML ~~LOC~~ SOLN
5.0000 [IU] | Freq: Once | SUBCUTANEOUS | Status: AC
  Administered 2021-05-09: 5 [IU] via SUBCUTANEOUS
  Filled 2021-05-09: qty 0.05

## 2021-05-09 MED ORDER — INSULIN ASPART 100 UNIT/ML IJ SOLN
4.0000 [IU] | Freq: Three times a day (TID) | INTRAMUSCULAR | Status: DC
  Administered 2021-05-09 – 2021-05-10 (×3): 4 [IU] via SUBCUTANEOUS
  Filled 2021-05-09 (×3): qty 1

## 2021-05-09 MED ORDER — INSULIN GLARGINE 100 UNIT/ML ~~LOC~~ SOLN
15.0000 [IU] | Freq: Every day | SUBCUTANEOUS | Status: DC
  Administered 2021-05-10: 09:00:00 15 [IU] via SUBCUTANEOUS
  Filled 2021-05-09 (×2): qty 0.15

## 2021-05-09 NOTE — TOC Initial Note (Signed)
Transition of Care [facility]) - Initial/Assessment Note    Patient Details  Name: Katie Gilbert MRN: [id number] Date of Birth: 09-01-1991  Transition of Care [facility]) CM/SW Contact:    Chapman Fitch, RN Phone Number: 05/09/2021, 2:59 PM  Clinical Narrative:                    [facility] consult for new PCP Patient states she does not have a preference of PCP practice  - [facility] - no new patient appointments - [facility] - no new patient appointments - [facility] - booked out till end of August - [facility] - new patient appointment scheduled for 5/30.  Added appointment info to AVS.  Per practice they accept patients insurance, but request that patient call to contact her insurance to confirm they are in network. Patient notified      Patient Goals and CMS Choice        Expected Discharge Plan and Services                                                Prior Living Arrangements/Services                       Activities of Daily Living Home Assistive Devices/Equipment: None ADL Screening (condition at time of admission) Patient's cognitive ability adequate to safely complete daily activities?: Yes Is the patient deaf or have difficulty hearing?: No Does the patient have difficulty seeing, even when wearing glasses/contacts?: No Does the patient have difficulty concentrating, remembering, or making decisions?: No Patient able to express need for assistance with ADLs?: Yes Does the patient have difficulty dressing or bathing?: No Independently performs ADLs?: Yes (appropriate for developmental age) Does the patient have difficulty walking or climbing stairs?: No Weakness of Legs: None Weakness of Arms/Hands: None  Permission Sought/Granted                  Emotional Assessment              Admission diagnosis:  Hyperglycemia [R73.9] Nausea and vomiting in adult [R11.2] Pyelonephritis of right kidney  [N12] Pyelonephritis [N12] Patient Active Problem List   Diagnosis Date Noted  . Pyelonephritis of right kidney 05/08/2021  . Migraine headache 05/08/2021  . Obesity (BMI 35.0-39.9 without comorbidity) 05/08/2021  . Diabetes mellitus without complication (HCC)   . 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 Diabetic foot ulcer (HCC) 01/16/2019   PCP:  Patient, No Pcp Per (Inactive) Pharmacy:   [facility] [address] ([address]), [place] - [address] [place] ([place]) [place] [address] Phone: [phone] Fax: [phone]  [facility] [address], [place] - [address] [place] [address] Phone: [phone] Fax: [phone]  [id number] [place], [place] - [address] AT [facility] OF [address] [place] [address] Phone: [phone] Fax: [phone]     Social Determinants of Health (SDOH) Interventions    Readmission Risk Interventions No flowsheet data found.

## 2021-05-09 NOTE — Progress Notes (Addendum)
Inpatient Diabetes Program Recommendations  AACE/ADA: New Consensus Statement on Inpatient Glycemic Control   Target Ranges:  Prepandial:   less than 140 mg/dL      Peak postprandial:   less than 180 mg/dL (1-2 hours)      Critically ill patients:  140 - 180 mg/dL   Results for NORRIS, BODLEY (MRN [id number]) as of 05/09/2021 09:52  Ref. Range 05/08/2021 08:25 05/08/2021 12:08 05/08/2021 18:14 05/08/2021 20:53 05/09/2021 07:13  Glucose-Capillary Latest Ref Range: 70 - 99 mg/dL 304 (H) 302 (H) 239 (H) 240 (H) 225 (H)  Results for TYLIYAH, MCMEEKIN (MRN [id number]) as of 05/09/2021 09:52  Ref. Range 07/25/2013 01:23 01/23/2015 05:04 06/13/2018 23:31 01/16/2019 12:47 05/08/2021 08:07  Hemoglobin A1C Latest Ref Range: 4.8 - 5.6 % 13.3 (H) 13.3 (H) 13.0 (H) 12.3 (H) 13.2 (H)   Review of Glycemic Control  Diabetes history: DM2 Outpatient Diabetes medications: Lantus 15 units QHS (not taking), Metformin 500 mg BID Current orders for Inpatient glycemic control: Lantus 10 units daily, Novolog 0-15 units TID with meals, Novolog 0-5 units QHS  Inpatient Diabetes Program Recommendations:    Insulin: Please consider increasing Lantus to 15 units daily and adding Novolog 4 units TID with meals for meal coverage if patient eats at least 50% of meals.  HbgA1C:  A1C 13.2% on 05/08/21 indicating an average glucose of 332 mg/dl over the past 2-3 months.  NOTE: In reviewing chart, noted patient was last inpatient 01/17/2019-01/20/2019 and was seen by inpatient diabetes coordinator on 01/17/2019.  Current A1C of 13.2% on 05/08/21 indicating an average glucose of 332 mg/dl over the past 2-3 months. Patient's A1C has been consistently elevated (noted to be 12.3-13.3% over the past 8 years).   Addendum 05/09/21_0 :42-Spoke with patient about diabetes and home regimen for diabetes control. Patient reports that she does not currently have a PCP but she verifies she has insurance. Patient states that she was taking Metformin but she  states she ran out 1-2 months ago. Patient is not currently checking glucose and states she does not have a glucometer (reports she lost her last one when she moved).  Inquired about Lantus and patient stated she did not take Lantus. Inquired if she had ever taken any Lantus and she stated "No". Discussed with patient that I had talked with her in 2020 when she was inpatient and she was discharged on Lantus and again asked if she ever took any of the Lantus. She then stated "yes, I took it" but does not know how long ago she last took it. Patient denies any symptoms of hyperglycemia over the past few weeks or months.  Discussed A1C results (13.2% on 05/08/21) and explained that current A1C indicates an average glucose of 332 mg/dl over the past 2-3 months. Discussed glucose and A1C goals. Discussed that in looking back in the chart, her A1C has been elevated for a long time indicating poor glycemic control.  Expressed concern about years of uncontrolled DM and impact it can have on her health especially since she is only 30 years old.  Discussed importance of checking CBGs and maintaining good CBG control to prevent long-term and short-term complications. Explained how hyperglycemia leads to damage within blood vessels which lead to the common complications seen with uncontrolled diabetes. Stressed to the patient the importance of improving glycemic control to prevent further complications from uncontrolled diabetes. Discussed impact of nutrition, exercise, stress, sickness, and medications on diabetes control. Asked patient if she would be willing to take  insulin again and she states that she would take insulin outpatient and she would prefer vial/syringe. Patient reports that she works remotely from home now and has a more consistent routine so she would be able to take better care of her DM. Encouraged patient check glucose consistently, take DM medications as prescribed, and get established with a local PCP for  consistent follow up.   Patient verbalized understanding of information discussed and reports no further questions at this time related to diabetes. Consulted TOC to request list of local providers taking new patients so patient can get established with a PCP. At time of discharge, please provide Rx for glucose monitoring kit (#[id number]), Lantus vials (#[id number]), Humalog vials (#[id number]) if prescribed short acting insulin, and insulin syringes (#[id number]).  Thanks, Barnie Alderman, RN, MSN, CDE Diabetes Coordinator Inpatient Diabetes Program [phone] (Team Pager from 8am to 5pm)

## 2021-05-09 NOTE — Progress Notes (Signed)
PROGRESS NOTE    Katie Gilbert  FAO:[id number] DOB: June 17, 1991 DOA: 05/08/2021 PCP: Patient, No Pcp Per (Inactive)   Assessment & Plan:   Principal Problem:   Pyelonephritis of right kidney Active Problems:   Diabetes mellitus without complication (HCC)   Migraine headache   Obesity (BMI 35.0-39.9 without comorbidity)   Pyelonephritis of right kidney: failed outpatient keflex. Continue on IV ceftriaxone. Urine cx is pending. Blood cxs NGTD. Continue on IVFs. Zofran prn for nausea/vomiting. Does not meet sepsis criteria so sepsis r/o   DM2: HbA1c 12.3, poorly controlled. Continue on lantus, SSI w/ accuchecks   Migraines: fioricet prn   Obesity: BMI 35. Would benefit from significant weight loss.   DVT prophylaxis: lovenox  Code Status: full  Family Communication: Disposition Plan:  Likely d/c back home  Level of care: Med-Surg   Status is: Inpatient  Remains inpatient appropriate because:IV treatments appropriate due to intensity of illness or inability to take PO and Inpatient level of care appropriate due to severity of illness   Dispo: The patient is from: Home              Anticipated d/c is to: Home              Patient currently is not medically stable to d/c.   Difficult to place patient : unclear    Consultants:      Procedures:    Antimicrobials: ceftriaxone    Subjective: Pt c/o nausea  Objective: Vitals:   05/08/21 2158 05/09/21 0023 05/09/21 0438 05/09/21 0713  BP: (!) 162/91 (!) 142/78 (!) 158/100 (!) 162/92  Pulse: 100 (!) 105 (!) 108 (!) 105  Resp: 16 16 16 16   Temp: 98.5 F (36.9 C) 98.2 F (36.8 C) 98.5 F (36.9 C) 98.3 F (36.8 C)  TempSrc:  Oral Oral   SpO2: 100% 95% 94% 99%  Weight:      Height:        Intake/Output Summary (Last 24 hours) at 05/09/2021 0719 Last data filed at 05/09/2021 0450 Gross per 24 hour  Intake 2028.95 ml  Output --  Net 2028.95 ml   Filed Weights   05/07/21 2042  Weight: 127 kg     Examination:  General exam: Appears calm and comfortable  Respiratory system: Clear to auscultation. Respiratory effort normal. Cardiovascular system: S1 & S2 +. No  rubs, gallops or clicks. No pedal edema. Gastrointestinal system: Abdomen is nondistended, soft and nontender. Hypoactive bowel sounds Central nervous system: Alert and oriented. Moves all 4 extremities  Psychiatry: Judgement and insight appear normal. Flat mood and affect    Data Reviewed: I have personally reviewed following labs and imaging studies  CBC: Recent Labs  Lab 05/05/21 0842 05/07/21 2045 05/09/21 0504  WBC 8.1 4.9 5.8  HGB 13.2 12.2 11.9*  HCT 38.1 34.3* 33.7*  MCV 82.5 80.0 80.4  PLT 262 239 265   Basic Metabolic Panel: Recent Labs  Lab 05/05/21 0842 05/07/21 2045 05/09/21 0504  NA 132* 131* 135  K 4.3 3.5 3.6  CL 99 96* 101  CO2 23 20* 22  GLUCOSE 384* 335* 247*  BUN 14 10 11   CREATININE 0.87 0.64 0.59  CALCIUM 8.5* 8.5* 8.5*   GFR: Estimated Creatinine Clearance: 161.8 mL/min (by C-G formula based on SCr of 0.59 mg/dL). Liver Function Tests: Recent Labs  Lab 05/05/21 0842 05/07/21 2045  AST 15 45*  ALT 14 39  ALKPHOS 101 93  BILITOT 1.2 1.5*  PROT 7.7 7.4  ALBUMIN 3.5 3.2*   Recent Labs  Lab 05/05/21 0842 05/07/21 2045  LIPASE 22 22   No results for input(s): AMMONIA in the last 168 hours. Coagulation Profile: No results for input(s): INR, PROTIME in the last 168 hours. Cardiac Enzymes: No results for input(s): CKTOTAL, CKMB, CKMBINDEX, TROPONINI in the last 168 hours. BNP (last 3 results) No results for input(s): PROBNP in the last 8760 hours. HbA1C: Recent Labs    05/08/21 0807  HGBA1C 13.2*   CBG: Recent Labs  Lab 05/08/21 0825 05/08/21 1208 05/08/21 1814 05/08/21 2053 05/09/21 0713  GLUCAP 304* 302* 239* 240* 225*   Lipid Profile: No results for input(s): CHOL, HDL, LDLCALC, TRIG, CHOLHDL, LDLDIRECT in the last 72 hours. Thyroid Function  Tests: No results for input(s): TSH, T4TOTAL, FREET4, T3FREE, THYROIDAB in the last 72 hours. Anemia Panel: No results for input(s): VITAMINB12, FOLATE, FERRITIN, TIBC, IRON, RETICCTPCT in the last 72 hours. Sepsis Labs: No results for input(s): PROCALCITON, LATICACIDVEN in the last 168 hours.  Recent Results (from the past 240 hour(s))  Resp Panel by RT-PCR (Flu A&B, Covid) Nasopharyngeal Swab     Status: None   Collection Time: 05/08/21  4:50 AM   Specimen: Nasopharyngeal Swab; Nasopharyngeal(NP) swabs in vial transport medium  Result Value Ref Range Status   SARS Coronavirus 2 by RT PCR NEGATIVE NEGATIVE Final    Comment: (NOTE) SARS-CoV-2 target nucleic acids are NOT DETECTED.  The SARS-CoV-2 RNA is generally detectable in upper respiratory specimens during the acute phase of infection. The lowest concentration of SARS-CoV-2 viral copies this assay can detect is 138 copies/mL. A negative result does not preclude SARS-Cov-2 infection and should not be used as the sole basis for treatment or other patient management decisions. A negative result may occur with  improper specimen collection/handling, submission of specimen other than nasopharyngeal swab, presence of viral mutation(s) within the areas targeted by this assay, and inadequate number of viral copies(<138 copies/mL). A negative result must be combined with clinical observations, patient history, and epidemiological information. The expected result is Negative.  Fact Sheet for Patients:  BloggerCourse.com  Fact Sheet for Healthcare Providers:  SeriousBroker.it  This test is no t yet approved or cleared by the [place] FDA and  has been authorized for detection and/or diagnosis of SARS-CoV-2 by FDA under an Emergency Use Authorization (EUA). This EUA will remain  in effect (meaning this test can be used) for the duration of the COVID-19 declaration under Section  564(b)(1) of the Act, 21 U.S.C.section 360bbb-3(b)(1), unless the authorization is terminated  or revoked sooner.       Influenza A by PCR NEGATIVE NEGATIVE Final   Influenza B by PCR NEGATIVE NEGATIVE Final    Comment: (NOTE) The Xpert Xpress SARS-CoV-2/FLU/RSV plus assay is intended as an aid in the diagnosis of influenza from Nasopharyngeal swab specimens and should not be used as a sole basis for treatment. Nasal washings and aspirates are unacceptable for Xpert Xpress SARS-CoV-2/FLU/RSV testing.  Fact Sheet for Patients: BloggerCourse.com  Fact Sheet for Healthcare Providers: SeriousBroker.it  This test is not yet approved or cleared by the [place] FDA and has been authorized for detection and/or diagnosis of SARS-CoV-2 by FDA under an Emergency Use Authorization (EUA). This EUA will remain in effect (meaning this test can be used) for the duration of the COVID-19 declaration under Section 564(b)(1) of the Act, 21 U.S.C. section 360bbb-3(b)(1), unless the authorization is terminated or revoked.  Performed at [facility], [address] [place], [place] [address]   Culture, blood (Routine X 2) w Reflex to ID Panel     Status: None (Preliminary result)   Collection Time: 05/08/21  8:07 AM   Specimen: BLOOD RIGHT HAND  Result Value Ref Range Status   Specimen Description BLOOD RIGHT HAND  Final   Special Requests   Final    BOTTLES DRAWN AEROBIC AND ANAEROBIC Blood Culture adequate volume   Culture   Final    NO GROWTH < 24 HOURS Performed at [facility], [address], [place], [place] [address]    Report Status PENDING  Incomplete  Culture, blood (Routine X 2) w Reflex to ID Panel     Status: None (Preliminary result)   Collection Time: 05/08/21  8:07 AM   Specimen: BLOOD LEFT HAND  Result Value Ref Range Status   Specimen Description BLOOD LEFT HAND  Final   Special Requests   Final     BOTTLES DRAWN AEROBIC AND ANAEROBIC Blood Culture adequate volume   Culture   Final    NO GROWTH < 24 HOURS Performed at [facility], [address], [place], [place] [address]    Report Status PENDING  Incomplete         Radiology Studies: CT ABDOMEN PELVIS WO CONTRAST  Result Date: 05/08/2021 CLINICAL DATA:  30 year old female with abdominal pain onset this morning. Recently negative for COVID-19. EXAM: CT ABDOMEN AND PELVIS WITHOUT CONTRAST TECHNIQUE: Multidetector CT imaging of the abdomen and pelvis was performed following the standard protocol without IV contrast. COMPARISON:  None. FINDINGS: Lower chest: Negative lung bases. Negative visible mediastinum. No pericardial or pleural effusion. Hepatobiliary: Negative noncontrast liver and gallbladder. Pancreas: Negative. Spleen: Negative. Adrenals/Urinary Tract: Normal adrenal glands. Asymmetrically inflamed appearance of the right kidney (series 2, image 43). No associated hydronephrosis. No nephrolithiasis. Negative noncontrast left kidney. Ureters appear fairly symmetric, and there is no ureteral calculus on the right. Several bilateral pelvic phleboliths are incidentally noted. Unremarkable urinary bladder. Stomach/Bowel: Negative large bowel. Normal appendix on series 2, image 78. Decompressed and negative terminal ileum. No dilated small bowel. Stomach and duodenum appear negative. No free air or free fluid. Vascular/Lymphatic: Normal caliber abdominal aorta. No calcified atherosclerosis. No lymphadenopathy. Reproductive: Negative noncontrast appearance. Other: No pelvic free fluid. Musculoskeletal: Mild degeneration in lower thoracic posterior elements. Hypoplastic or absent spinous process at T11. No acute osseous abnormality identified. IMPRESSION: 1. Inflamed appearance of the noncontrast Right Kidney suspicious for Acute Pyelonephritis. CT Abdomen with IV contrast would confirm. No urinary calculus or obstructive uropathy.  The noncontrast left kidney and bladder appear negative. 2. Normal appendix and no acute or inflammatory process identified in the noncontrast CT Abdomen and Pelvis. Electronically Signed   By: Odessa Fleming M.D.   On: 05/08/2021 04:27        Scheduled Meds: . enoxaparin (LOVENOX) injection  0.5 mg/kg Subcutaneous Q24H  . insulin aspart  0-15 Units Subcutaneous TID WC  . insulin aspart  0-5 Units Subcutaneous QHS  . insulin glargine  10 Units Subcutaneous Daily   Continuous Infusions: . cefTRIAXone (ROCEPHIN)  IV    . lactated ringers 100 mL/hr at 05/09/21 0635  . promethazine (PHENERGAN) injection (IM or IVPB) 12.5 mg (05/08/21 2303)     LOS: 1 day    Time spent: 32 mins     Charise Killian, MD [facility] Pager [phone]  If 7PM-7AM, please contact night-coverage 05/09/2021, 7:19 AM

## 2021-05-10 DIAGNOSIS — E669 Obesity, unspecified: Secondary | ICD-10-CM | POA: Diagnosis not present

## 2021-05-10 DIAGNOSIS — E1169 Type 2 diabetes mellitus with other specified complication: Secondary | ICD-10-CM | POA: Diagnosis not present

## 2021-05-10 DIAGNOSIS — N12 Tubulo-interstitial nephritis, not specified as acute or chronic: Secondary | ICD-10-CM | POA: Diagnosis not present

## 2021-05-10 LAB — COMPREHENSIVE METABOLIC PANEL
ALT: 21 U/L (ref 0–44)
AST: 15 U/L (ref 15–41)
Albumin: 2.9 g/dL — ABNORMAL LOW (ref 3.5–5.0)
Alkaline Phosphatase: 82 U/L (ref 38–126)
Anion gap: 11 (ref 5–15)
BUN: 10 mg/dL (ref 6–20)
CO2: 26 mmol/L (ref 22–32)
Calcium: 8.6 mg/dL — ABNORMAL LOW (ref 8.9–10.3)
Chloride: 98 mmol/L (ref 98–111)
Creatinine, Ser: 0.52 mg/dL (ref 0.44–1.00)
GFR, Estimated: >60 mL/min (ref >60)
Glucose, Bld: 197 mg/dL — ABNORMAL HIGH (ref 70–99)
Potassium: 3.1 mmol/L — ABNORMAL LOW (ref 3.5–5.1)
Sodium: 135 mmol/L (ref 135–145)
Total Bilirubin: 1 mg/dL (ref 0.3–1.2)
Total Protein: 6.6 g/dL (ref 6.5–8.1)

## 2021-05-10 LAB — CBC
HCT: 35.9 % — ABNORMAL LOW (ref 36.0–46.0)
Hemoglobin: 12.6 g/dL (ref 12.0–15.0)
MCH: 28.1 pg (ref 26.0–34.0)
MCHC: 35.1 g/dL (ref 30.0–36.0)
MCV: 80.1 fL (ref 80.0–100.0)
Platelets: 293 10*3/uL (ref 150–400)
RBC: 4.48 MIL/uL (ref 3.87–5.11)
RDW: 11.8 % (ref 11.5–15.5)
WBC: 6.9 10*3/uL (ref 4.0–10.5)
nRBC: 0 % (ref 0.0–0.2)

## 2021-05-10 LAB — GLUCOSE, CAPILLARY
Glucose-Capillary: 195 mg/dL — ABNORMAL HIGH (ref 70–99)
Glucose-Capillary: 165 mg/dL — ABNORMAL HIGH (ref 70–99)

## 2021-05-10 MED ORDER — INSULIN ASPART 100 UNIT/ML IJ SOLN: 4.0000 [IU] | Freq: Three times a day (TID) | INTRAMUSCULAR | 0 refills | Status: DC

## 2021-05-10 MED ORDER — BLOOD GLUCOSE MONITOR KIT: PACK | 0 refills | Status: AC

## 2021-05-10 MED ORDER — POTASSIUM CHLORIDE CRYS ER 20 MEQ PO TBCR
40.0000 meq | EXTENDED_RELEASE_TABLET | Freq: Once | ORAL | Status: AC
  Administered 2021-05-10: 40 meq via ORAL
  Filled 2021-05-10: qty 2

## 2021-05-10 MED ORDER — CIPROFLOXACIN HCL 500 MG PO TABS: 500.0000 mg | ORAL_TABLET | Freq: Two times a day (BID) | ORAL | 0 refills | Status: AC

## 2021-05-10 MED ORDER — INSULIN GLARGINE 100 UNIT/ML ~~LOC~~ SOLN: 15.0000 [IU] | Freq: Every day | SUBCUTANEOUS | 0 refills | Status: DC

## 2021-05-10 MED ORDER — "INSULIN SYRINGE/NEEDLE 27G X 1/2"" 0.5 ML MISC": 0 refills | Status: DC

## 2021-05-10 MED ORDER — ONDANSETRON 4 MG PO TBDP: 4.0000 mg | ORAL_TABLET | Freq: Three times a day (TID) | ORAL | 0 refills | Status: AC | PRN

## 2021-05-10 NOTE — TOC Transition Note (Signed)
Transition of Care [facility]) - CM/SW Discharge Note   Patient Details  Name: Katie Gilbert MRN: [id number] Date of Birth: [birth year]  Transition [facility]) CM/SW Contact:  Caryn Section, RN Phone Number: 05/10/2021, 3:45 PM   Clinical Narrative:   TOC in to see patient.  Patient is discharging, states she has PCP appointment that [facility] ;made for her earlier this week, amenable to plan, will plan to attend.  Declines needs at home, no concerns getting to appointments or getting medications.  TOC sign off         Patient Goals and CMS Choice        Discharge Placement                       Discharge Plan and Services                                     Social Determinants of Health (SDOH) Interventions     Readmission Risk Interventions No flowsheet data found.

## 2021-05-10 NOTE — Discharge Summary (Signed)
Physician Discharge Summary  Katie Gilbert JIR:[id number] DOB: 1991/09/02 DOA: 05/08/2021  PCP: Patient, No Pcp Per (Inactive)  Admit date: 05/08/2021 Discharge date: 05/10/2021  Admitted From: home  Disposition:  Home   Recommendations for Outpatient Follow-up:  1. Follow up with PCP in 1-2 weeks  Home Health: no  Equipment/Devices:  Discharge Condition: stable  CODE STATUS: full  Diet recommendation: carb modified   Brief/Interim Summary: HPI was taken from Dr. Blaine Hamper: Katie Gilbert is a 30 y.o. female with medical history significant of diabetes mellitus, genital herpes, obesity, migraine headache, who presents with nausea, vomiting, abdominal pain.   Patient states that she has been sick for more than 4 days.  Patient was seen in urgent care on 5/19 due to headache and was found to have UTI.  She had positive urine analysis for UTI. Patient was discharged on Keflex for UTI and Floricet for headache.   She states that her migraine headache has resolved. She has had 3 doses of Keflex at home but states now that she has nausea, vomiting and abdominal pain. She cannot keep anything down.  She has had more than 10 times of nonbilious nonbloody vomiting since yesterday.  No diarrhea.  No flank pain.  Abdominal pain is located in the middle abdomen, constant, aching, 10 out of 10 in severity, nonradiating.  Patient denies fever or chills.  No dysuria, burning on urination or increased urinary frequency.  Denies chest pain, cough, shortness of breath.  No unilateral numbness or weakness in extremities.  ED Course: pt was found to have WBC 4.9, negative pregnancy test, lipase 22, negative COVID PCR, urinalysis (hazy appearance, negative leukocyte, negative bacteria, WBC 6-10), electrolytes renal function okay, temperature normal, blood pressure 155/77, heart rate 107 RR 19, oxygen saturation 100% on room air.  CT abdomen/pelvis findings are suggestive for right kidney pyelonephritis.   Patient is admitted to [facility] bed as inpatient.  CT-abd/pelvis 1. Inflamed appearance of the noncontrast Right Kidney suspicious for Acute Pyelonephritis. CT Abdomen with IV contrast would confirm. No urinary calculus or obstructive uropathy. The noncontrast left kidney and bladder appear negative.  2. Normal appendix and no acute or inflammatory process identified in the noncontrast CT Abdomen and Pelvis.  Hospital course from Dr. Jimmye Norman 5/23-5/24/22: Pt was found to have pyelonephritis of the right kidney. Outpatient pt had failed keflex. Pt was placed on IV ceftriaxone. Unfortunately, pt's urine cx showed no growth, likely pt was given abxs prior to collecting urine for urine cx. Pt was d/c on po cipro to finish the course at home. Of note, pt was found to have HbA1c 12.3 and pt was started back on insulin for her poorly controlled DM2. Pt was also sent home w/ insulin, glucose monitoring kit & supplies as well.  Discharge Diagnoses:  Principal Problem:   Pyelonephritis of right kidney Active Problems:   Diabetes mellitus without complication (HCC)   Migraine headache   Obesity (BMI 35.0-39.9 without comorbidity)  Pyelonephritis of right kidney:failed outpatient keflex. Continue on IV ceftriaxone while inpatient and switched to po cipro at d/c. Urine cx shows no growth. Blood cxs NGTD. Zofran prn for nausea/vomiting. Does not meet sepsis criteria so sepsis r/o   DM2:HbA1c 12.3, poorly controlled. Continue on lantus, SSI w/ accuchecks   Migraines: fioricet prn   Obesity: BMI 35. Would benefit from significant weight loss.   Discharge Instructions  Discharge Instructions    Diet Carb Modified   Complete by: As directed    Discharge instructions  Complete by: As directed    F/u w/ PCP in 1-2 weeks   Increase activity slowly   Complete by: As directed    No wound care   Complete by: As directed      Allergies as of 05/10/2021   No Known Allergies     Medication  List    STOP taking these medications   cephALEXin 500 MG capsule Commonly known as: KEFLEX   Insulin Pen Needle 29G X 5MM Misc   Insulin Syringe 27G X 1/2" 1 ML Misc Replaced by: INS SYRINGE/NEEDLE .5CC/27G 27G X 1/2" 0.5 ML Misc   promethazine 25 MG tablet Commonly known as: PHENERGAN     TAKE these medications   acetaminophen 325 MG tablet Commonly known as: TYLENOL Take 2 tablets (650 mg total) by mouth every 6 (six) hours as needed for mild pain (or Fever >/= 101).   ascorbic acid 500 MG tablet Commonly known as: VITAMIN C Take 1 tablet (500 mg total) by mouth 2 (two) times daily.   blood glucose meter kit and supplies Kit Dispense based on patient and insurance preference. Use up to four times daily as directed. (FOR ICD-9 250.00, 250.01).   butalbital-acetaminophen-caffeine 50-325-40 MG tablet Commonly known as: FIORICET Take 1-2 tablets by mouth every 6 (six) hours as needed for headache.   ciprofloxacin 500 MG tablet Commonly known as: Cipro Take 1 tablet (500 mg total) by mouth 2 (two) times daily for 6 days.   INS SYRINGE/NEEDLE .5CC/27G 27G X 1/2" 0.5 ML Misc Needs enough syringes for lantus daily and aspart/novolog TID x 30 days. Dispense 1 box Replaces: Insulin Syringe 27G X 1/2" 1 ML Misc   insulin aspart 100 UNIT/ML injection Commonly known as: novoLOG Inject 4 Units into the skin 3 (three) times daily with meals.   insulin glargine 100 UNIT/ML injection Commonly known as: LANTUS Inject 0.15 mLs (15 Units total) into the skin daily. Start taking on: May 11, 2021 What changed: when to take this   metFORMIN 500 MG tablet Commonly known as: Glucophage Take 1 tablet (500 mg total) by mouth 2 (two) times daily with a meal.   ondansetron 4 MG disintegrating tablet Commonly known as: Zofran ODT Take 1 tablet (4 mg total) by mouth every 8 (eight) hours as needed for up to 7 days for nausea or vomiting.       Follow-up Information    Mechele Claude, FNP. Go on 05/16/2021.   Specialty: Family Medicine Why: Arrive at 1:15 Please call your insurnace before appointment to confirm they are in network  Contact information: [place] [address] [phone]              No Known Allergies  Consultations:     Procedures/Studies: CT ABDOMEN PELVIS WO CONTRAST  Result Date: 05/08/2021 CLINICAL DATA:  30 year old female with abdominal pain onset this morning. Recently negative for COVID-19. EXAM: CT ABDOMEN AND PELVIS WITHOUT CONTRAST TECHNIQUE: Multidetector CT imaging of the abdomen and pelvis was performed following the standard protocol without IV contrast. COMPARISON:  None. FINDINGS: Lower chest: Negative lung bases. Negative visible mediastinum. No pericardial or pleural effusion. Hepatobiliary: Negative noncontrast liver and gallbladder. Pancreas: Negative. Spleen: Negative. Adrenals/Urinary Tract: Normal adrenal glands. Asymmetrically inflamed appearance of the right kidney (series 2, image 43). No associated hydronephrosis. No nephrolithiasis. Negative noncontrast left kidney. Ureters appear fairly symmetric, and there is no ureteral calculus on the right. Several bilateral pelvic phleboliths are incidentally noted. Unremarkable urinary bladder.  Stomach/Bowel: Negative large bowel. Normal appendix on series 2, image 78. Decompressed and negative terminal ileum. No dilated small bowel. Stomach and duodenum appear negative. No free air or free fluid. Vascular/Lymphatic: Normal caliber abdominal aorta. No calcified atherosclerosis. No lymphadenopathy. Reproductive: Negative noncontrast appearance. Other: No pelvic free fluid. Musculoskeletal: Mild degeneration in lower thoracic posterior elements. Hypoplastic or absent spinous process at T11. No acute osseous abnormality identified. IMPRESSION: 1. Inflamed appearance of the noncontrast Right Kidney suspicious for Acute Pyelonephritis. CT Abdomen with IV contrast would  confirm. No urinary calculus or obstructive uropathy. The noncontrast left kidney and bladder appear negative. 2. Normal appendix and no acute or inflammatory process identified in the noncontrast CT Abdomen and Pelvis. Electronically Signed   By: Genevie Ann M.D.   On: 05/08/2021 04:27   CT Head Wo Contrast  Result Date: 05/05/2021 CLINICAL DATA:  Headache, nausea EXAM: CT HEAD WITHOUT CONTRAST TECHNIQUE: Contiguous axial images were obtained from the base of the skull through the vertex without intravenous contrast. COMPARISON:  None. FINDINGS: Brain: No evidence of acute infarction, hemorrhage, hydrocephalus, extra-axial collection or mass lesion/mass effect. Vascular: No hyperdense vessel or unexpected calcification. Skull: Normal. Negative for fracture or focal lesion. Sinuses/Orbits: No acute finding. Other: None. IMPRESSION: No acute intracranial findings. Electronically Signed   By: Davina Poke D.O.   On: 05/05/2021 11:13      Subjective: Pt c/o intermittent nausea   Discharge Exam: Vitals:   05/10/21 0822 05/10/21 1201  BP: (!) 153/98 (!) 171/110  Pulse: 98 98  Resp: 18 16  Temp: 98.4 F (36.9 C) 98.4 F (36.9 C)  SpO2: 99% 98%   Vitals:   05/10/21 0000 05/10/21 0341 05/10/21 0822 05/10/21 1201  BP: (!) 106/56 (!) 144/96 (!) 153/98 (!) 171/110  Pulse: (!) 109 (!) 102 98 98  Resp: '20 17 18 16  ' Temp: 98.9 F (37.2 C) 98.4 F (36.9 C) 98.4 F (36.9 C) 98.4 F (36.9 C)  TempSrc: Oral Oral Oral Oral  SpO2: 100% 98% 99% 98%  Weight:      Height:        General: Pt is alert, awake, not in acute distress. Obese  Cardiovascular: S1/S2 +, no rubs, no gallops Respiratory: CTA bilaterally, no wheezing, no rhonchi Abdominal: Soft, NT, obese, bowel sounds + Extremities:  no cyanosis    The results of significant diagnostics from this hospitalization (including imaging, microbiology, ancillary and laboratory) are listed below for reference.     Microbiology: Recent  Results (from the past 240 hour(s))  Urine Culture     Status: None   Collection Time: 05/08/21  3:30 AM   Specimen: Urine, Random  Result Value Ref Range Status   Specimen Description   Final    URINE, RANDOM Performed at [facility], [address] [place], [place] [address]    Special Requests   Final    NONE Performed at [facility], [address] [place], [place] [address]    Culture   Final    NO GROWTH Performed at [facility], [place] [address] [place], [place] [address]    Report Status 05/09/2021 FINAL  Final  Resp Panel by RT-PCR (Flu A&B, Covid) Nasopharyngeal Swab     Status: None   Collection Time: 05/08/21  4:50 AM   Specimen: Nasopharyngeal Swab; Nasopharyngeal(NP) swabs in vial transport medium  Result Value Ref Range Status   SARS Coronavirus 2 by RT PCR NEGATIVE NEGATIVE Final    Comment: (  NOTE) SARS-CoV-2 target nucleic acids are NOT DETECTED.  The SARS-CoV-2 RNA is generally detectable in upper respiratory specimens during the acute phase of infection. The lowest concentration of SARS-CoV-2 viral copies this assay can detect is 138 copies/mL. A negative result does not preclude SARS-Cov-2 infection and should not be used as the sole basis for treatment or other patient management decisions. A negative result may occur with  improper specimen collection/handling, submission of specimen other than nasopharyngeal swab, presence of viral mutation(s) within the areas targeted by this assay, and inadequate number of viral copies(<138 copies/mL). A negative result must be combined with clinical observations, patient history, and epidemiological information. The expected result is Negative.  Fact Sheet for Patients:  EntrepreneurPulse.com.au  Fact Sheet for Healthcare Providers:  IncredibleEmployment.be  This test is no t yet approved or cleared by the [place] FDA and  has been authorized  for detection and/or diagnosis of SARS-CoV-2 by FDA under an Emergency Use Authorization (EUA). This EUA will remain  in effect (meaning this test can be used) for the duration of the COVID-19 declaration under Section 564(b)(1) of the Act, 21 U.S.C.section 360bbb-3(b)(1), unless the authorization is terminated  or revoked sooner.       Influenza A by PCR NEGATIVE NEGATIVE Final   Influenza B by PCR NEGATIVE NEGATIVE Final    Comment: (NOTE) The Xpert Xpress SARS-CoV-2/FLU/RSV plus assay is intended as an aid in the diagnosis of influenza from Nasopharyngeal swab specimens and should not be used as a sole basis for treatment. Nasal washings and aspirates are unacceptable for Xpert Xpress SARS-CoV-2/FLU/RSV testing.  Fact Sheet for Patients: EntrepreneurPulse.com.au  Fact Sheet for Healthcare Providers: IncredibleEmployment.be  This test is not yet approved or cleared by the [place] FDA and has been authorized for detection and/or diagnosis of SARS-CoV-2 by FDA under an Emergency Use Authorization (EUA). This EUA will remain in effect (meaning this test can be used) for the duration of the COVID-19 declaration under Section 564(b)(1) of the Act, 21 U.S.C. section 360bbb-3(b)(1), unless the authorization is terminated or revoked.  Performed at [facility], [address] [place], [place] [address]   Culture, blood (Routine X 2) w Reflex to ID Panel     Status: None (Preliminary result)   Collection Time: 05/08/21  8:07 AM   Specimen: BLOOD RIGHT HAND  Result Value Ref Range Status   Specimen Description BLOOD RIGHT HAND  Final   Special Requests   Final    BOTTLES DRAWN AEROBIC AND ANAEROBIC Blood Culture adequate volume   Culture   Final    NO GROWTH 2 DAYS Performed at [facility], [address], [place], [place] [address]    Report Status PENDING  Incomplete  Culture, blood (Routine X 2) w Reflex to ID  Panel     Status: None (Preliminary result)   Collection Time: 05/08/21  8:07 AM   Specimen: BLOOD LEFT HAND  Result Value Ref Range Status   Specimen Description BLOOD LEFT HAND  Final   Special Requests   Final    BOTTLES DRAWN AEROBIC AND ANAEROBIC Blood Culture adequate volume   Culture   Final    NO GROWTH 2 DAYS Performed at [facility], [address] [place], [place] [address]    Report Status PENDING  Incomplete     Labs: BNP (last 3 results) No results for input(s): BNP in the last 8760 hours. Basic Metabolic Panel: Recent Labs  Lab 05/05/21 0842 05/07/21 2045  05/09/21 0504 05/10/21 0517  NA 132* 131* 135 135  K 4.3 3.5 3.6 3.1*  CL 99 96* 101 98  CO2 23 20* 22 26  GLUCOSE 384* 335* 247* 197*  BUN '14 10 11 10  ' CREATININE 0.87 0.64 0.59 0.52  CALCIUM 8.5* 8.5* 8.5* 8.6*   Liver Function Tests: Recent Labs  Lab 05/05/21 0842 05/07/21 2045 05/10/21 0517  AST 15 45* 15  ALT 14 39 21  ALKPHOS 101 93 82  BILITOT 1.2 1.5* 1.0  PROT 7.7 7.4 6.6  ALBUMIN 3.5 3.2* 2.9*   Recent Labs  Lab 05/05/21 0842 05/07/21 2045  LIPASE 22 22   No results for input(s): AMMONIA in the last 168 hours. CBC: Recent Labs  Lab 05/05/21 0842 05/07/21 2045 05/09/21 0504 05/10/21 0517  WBC 8.1 4.9 5.8 6.9  HGB 13.2 12.2 11.9* 12.6  HCT 38.1 34.3* 33.7* 35.9*  MCV 82.5 80.0 80.4 80.1  PLT 262 239 265 293   Cardiac Enzymes: No results for input(s): CKTOTAL, CKMB, CKMBINDEX, TROPONINI in the last 168 hours. BNP: Invalid input(s): POCBNP CBG: Recent Labs  Lab 05/09/21 1147 05/09/21 1602 05/09/21 2049 05/10/21 0824 05/10/21 1203  GLUCAP 244* 224* 201* 195* 165*   D-Dimer No results for input(s): DDIMER in the last 72 hours. Hgb A1c Recent Labs    05/08/21 0807  HGBA1C 13.2*   Lipid Profile No results for input(s): CHOL, HDL, LDLCALC, TRIG, CHOLHDL, LDLDIRECT in the last 72 hours. Thyroid function studies No results for input(s): TSH, T4TOTAL,  T3FREE, THYROIDAB in the last 72 hours.  Invalid input(s): FREET3 Anemia work up No results for input(s): VITAMINB12, FOLATE, FERRITIN, TIBC, IRON, RETICCTPCT in the last 72 hours. Urinalysis    Component Value Date/Time   COLORURINE YELLOW (A) 05/08/2021 0330   APPEARANCEUR HAZY (A) 05/08/2021 0330   APPEARANCEUR Cloudy 07/15/2014 1806   LABSPEC 1.028 05/08/2021 0330   LABSPEC 1.039 07/15/2014 1806   PHURINE 5.0 05/08/2021 0330   GLUCOSEU >=500 (A) 05/08/2021 0330   GLUCOSEU >=500 07/15/2014 1806   HGBUR MODERATE (A) 05/08/2021 0330   BILIRUBINUR NEGATIVE 05/08/2021 0330   BILIRUBINUR Negative 07/15/2014 1806   KETONESUR 80 (A) 05/08/2021 0330   PROTEINUR 100 (A) 05/08/2021 0330   NITRITE NEGATIVE 05/08/2021 0330   LEUKOCYTESUR NEGATIVE 05/08/2021 0330   LEUKOCYTESUR 3+ 07/15/2014 1806   Sepsis Labs Invalid input(s): PROCALCITONIN,  WBC,  LACTICIDVEN Microbiology Recent Results (from the past 240 hour(s))  Urine Culture     Status: None   Collection Time: 05/08/21  3:30 AM   Specimen: Urine, Random  Result Value Ref Range Status   Specimen Description   Final    URINE, RANDOM Performed at [facility], [address], [place], [place] [address]    Special Requests   Final    NONE Performed at [facility] [address] [place], [place] [address]    Culture   Final    NO GROWTH Performed at [facility], [place] [address] [place], [place] [address]    Report Status 05/09/2021 FINAL  Final  Resp Panel by RT-PCR (Flu A&B, Covid) Nasopharyngeal Swab     Status: None   Collection Time: 05/08/21  4:50 AM   Specimen: Nasopharyngeal Swab; Nasopharyngeal(NP) swabs in vial transport medium  Result Value Ref Range Status   SARS Coronavirus 2 by RT PCR NEGATIVE NEGATIVE Final    Comment: (NOTE) SARS-CoV-2 target nucleic acids are NOT DETECTED.  The SARS-CoV-2 RNA is generally  detectable in upper respiratory specimens during the acute phase of  infection. The lowest concentration of SARS-CoV-2 viral copies this assay can detect is 138 copies/mL. A negative result does not preclude SARS-Cov-2 infection and should not be used as the sole basis for treatment or other patient management decisions. A negative result may occur with  improper specimen collection/handling, submission of specimen other than nasopharyngeal swab, presence of viral mutation(s) within the areas targeted by this assay, and inadequate number of viral copies(<138 copies/mL). A negative result must be combined with clinical observations, patient history, and epidemiological information. The expected result is Negative.  Fact Sheet for Patients:  EntrepreneurPulse.com.au  Fact Sheet for Healthcare Providers:  IncredibleEmployment.be  This test is no t yet approved or cleared by the [place] FDA and  has been authorized for detection and/or diagnosis of SARS-CoV-2 by FDA under an Emergency Use Authorization (EUA). This EUA will remain  in effect (meaning this test can be used) for the duration of the COVID-19 declaration under Section 564(b)(1) of the Act, 21 U.S.C.section 360bbb-3(b)(1), unless the authorization is terminated  or revoked sooner.       Influenza A by PCR NEGATIVE NEGATIVE Final   Influenza B by PCR NEGATIVE NEGATIVE Final    Comment: (NOTE) The Xpert Xpress SARS-CoV-2/FLU/RSV plus assay is intended as an aid in the diagnosis of influenza from Nasopharyngeal swab specimens and should not be used as a sole basis for treatment. Nasal washings and aspirates are unacceptable for Xpert Xpress SARS-CoV-2/FLU/RSV testing.  Fact Sheet for Patients: EntrepreneurPulse.com.au  Fact Sheet for Healthcare Providers: IncredibleEmployment.be  This test is not yet approved or cleared by the [place] FDA and has been authorized for detection and/or diagnosis of SARS-CoV-2  by FDA under an Emergency Use Authorization (EUA). This EUA will remain in effect (meaning this test can be used) for the duration of the COVID-19 declaration under Section 564(b)(1) of the Act, 21 U.S.C. section 360bbb-3(b)(1), unless the authorization is terminated or revoked.  Performed at [facility], [address] [place], [place] [address]   Culture, blood (Routine X 2) w Reflex to ID Panel     Status: None (Preliminary result)   Collection Time: 05/08/21  8:07 AM   Specimen: BLOOD RIGHT HAND  Result Value Ref Range Status   Specimen Description BLOOD RIGHT HAND  Final   Special Requests   Final    BOTTLES DRAWN AEROBIC AND ANAEROBIC Blood Culture adequate volume   Culture   Final    NO GROWTH 2 DAYS Performed at [facility], [address], [place], [place] [address]    Report Status PENDING  Incomplete  Culture, blood (Routine X 2) w Reflex to ID Panel     Status: None (Preliminary result)   Collection Time: 05/08/21  8:07 AM   Specimen: BLOOD LEFT HAND  Result Value Ref Range Status   Specimen Description BLOOD LEFT HAND  Final   Special Requests   Final    BOTTLES DRAWN AEROBIC AND ANAEROBIC Blood Culture adequate volume   Culture   Final    NO GROWTH 2 DAYS Performed at [facility], [address] [place], [place] [address]    Report Status PENDING  Incomplete     Time coordinating discharge: Over 30 minutes  SIGNED:   Wyvonnia Dusky, MD  [facility] 05/10/2021, 12:28 PM Pager   If 7PM-7AM, please contact night-coverage

## 2021-05-13 LAB — CULTURE, BLOOD (ROUTINE X 2)
Culture: NO GROWTH
Culture: NO GROWTH
Special Requests: ADEQUATE
Special Requests: ADEQUATE

## 2021-05-23 LAB — BLOOD GAS, VENOUS
Acid-base deficit: 4.3 mmol/L — ABNORMAL HIGH (ref 0.0–2.0)
Bicarbonate: 20.1 mmol/L (ref 20.0–28.0)
O2 Saturation: 94.4 %
Patient temperature: 37
pCO2, Ven: 34 mmHg — ABNORMAL LOW (ref 44.0–60.0)
pH, Ven: 7.38 (ref 7.250–7.430)
pO2, Ven: 74 mmHg — ABNORMAL HIGH (ref 32.0–45.0)

## 2021-06-09 ENCOUNTER — Encounter: Payer: Self-pay | Admitting: Emergency Medicine

## 2021-06-09 DIAGNOSIS — F17210 Nicotine dependence, cigarettes, uncomplicated: Secondary | ICD-10-CM | POA: Diagnosis not present

## 2021-06-09 DIAGNOSIS — R112 Nausea with vomiting, unspecified: Secondary | ICD-10-CM | POA: Insufficient documentation

## 2021-06-09 DIAGNOSIS — R1030 Lower abdominal pain, unspecified: Secondary | ICD-10-CM | POA: Diagnosis not present

## 2021-06-09 DIAGNOSIS — E119 Type 2 diabetes mellitus without complications: Secondary | ICD-10-CM | POA: Diagnosis not present

## 2021-06-09 DIAGNOSIS — Z7984 Long term (current) use of oral hypoglycemic drugs: Secondary | ICD-10-CM | POA: Insufficient documentation

## 2021-06-09 DIAGNOSIS — Z794 Long term (current) use of insulin: Secondary | ICD-10-CM | POA: Diagnosis not present

## 2021-06-09 LAB — COMPREHENSIVE METABOLIC PANEL
ALT: 13 U/L (ref 0–44)
AST: 15 U/L (ref 15–41)
Albumin: 3.8 g/dL (ref 3.5–5.0)
Alkaline Phosphatase: 87 U/L (ref 38–126)
Anion gap: 14 (ref 5–15)
BUN: 14 mg/dL (ref 6–20)
CO2: 20 mmol/L — ABNORMAL LOW (ref 22–32)
Calcium: 9.1 mg/dL (ref 8.9–10.3)
Chloride: 103 mmol/L (ref 98–111)
Creatinine, Ser: 0.64 mg/dL (ref 0.44–1.00)
GFR, Estimated: >60 mL/min (ref >60)
Glucose, Bld: 208 mg/dL — ABNORMAL HIGH (ref 70–99)
Potassium: 4.3 mmol/L (ref 3.5–5.1)
Sodium: 137 mmol/L (ref 135–145)
Total Bilirubin: 1.4 mg/dL — ABNORMAL HIGH (ref 0.3–1.2)
Total Protein: 8.2 g/dL — ABNORMAL HIGH (ref 6.5–8.1)

## 2021-06-09 LAB — CBC
HCT: 37.6 % (ref 36.0–46.0)
Hemoglobin: 12.6 g/dL (ref 12.0–15.0)
MCH: 27.8 pg (ref 26.0–34.0)
MCHC: 33.5 g/dL (ref 30.0–36.0)
MCV: 83 fL (ref 80.0–100.0)
Platelets: 357 10*3/uL (ref 150–400)
RBC: 4.53 MIL/uL (ref 3.87–5.11)
RDW: 12.4 % (ref 11.5–15.5)
WBC: 12.3 10*3/uL — ABNORMAL HIGH (ref 4.0–10.5)
nRBC: 0 % (ref 0.0–0.2)

## 2021-06-09 LAB — TROPONIN I (HIGH SENSITIVITY): Troponin I (High Sensitivity): 4 ng/L (ref <18)

## 2021-06-09 LAB — LACTIC ACID, PLASMA: Lactic Acid, Venous: 1.4 mmol/L (ref 0.5–1.9)

## 2021-06-09 LAB — LIPASE, BLOOD: Lipase: 21 U/L (ref 11–51)

## 2021-06-09 NOTE — ED Triage Notes (Signed)
Pt with c/o N/V and abdominal pain x1 day. Pt with kidney infection 2 weeks ago and reports completed antibiotics. HR elevated in triage.

## 2021-06-10 ENCOUNTER — Emergency Department
Admission: EM | Admit: 2021-06-10 | Discharge: 2021-06-10 | Disposition: A | Discharge status: Home / Self Care | Payer: Managed Care, Other (non HMO) | Attending: Emergency Medicine | Admitting: Emergency Medicine

## 2021-06-10 ENCOUNTER — Emergency Department: Payer: Managed Care, Other (non HMO)

## 2021-06-10 DIAGNOSIS — R1030 Lower abdominal pain, unspecified: Secondary | ICD-10-CM

## 2021-06-10 DIAGNOSIS — R112 Nausea with vomiting, unspecified: Secondary | ICD-10-CM

## 2021-06-10 LAB — URINALYSIS, COMPLETE (UACMP) WITH MICROSCOPIC
Bacteria, UA: NONE SEEN
Bilirubin Urine: NEGATIVE
Glucose, UA: >=500 mg/dL — AB
Ketones, ur: 80 mg/dL — AB
Leukocytes,Ua: NEGATIVE
Nitrite: NEGATIVE
Protein, ur: >=300 mg/dL — AB
RBC / HPF: >50 RBC/hpf — ABNORMAL HIGH (ref 0–5)
Specific Gravity, Urine: 1.028 (ref 1.005–1.030)
pH: 5 (ref 5.0–8.0)

## 2021-06-10 LAB — MAGNESIUM: Magnesium: 2.1 mg/dL (ref 1.7–2.4)

## 2021-06-10 LAB — POC URINE PREG, ED: Preg Test, Ur: NEGATIVE

## 2021-06-10 MED ORDER — LACTATED RINGERS IV BOLUS
1000.0000 mL | Freq: Once | INTRAVENOUS | Status: AC
  Administered 2021-06-10: 1000 mL via INTRAVENOUS

## 2021-06-10 MED ORDER — DROPERIDOL 2.5 MG/ML IJ SOLN
2.5000 mg | Freq: Once | INTRAMUSCULAR | Status: AC
  Administered 2021-06-10: 2.5 mg via INTRAVENOUS
  Filled 2021-06-10: qty 2

## 2021-06-10 MED ORDER — ONDANSETRON 4 MG PO TBDP: ORAL_TABLET | ORAL | 0 refills | Status: DC

## 2021-06-10 MED ORDER — KETOROLAC TROMETHAMINE 30 MG/ML IJ SOLN
15.0000 mg | Freq: Once | INTRAMUSCULAR | Status: AC
  Administered 2021-06-10: 15 mg via INTRAVENOUS
  Filled 2021-06-10: qty 1

## 2021-06-10 MED ORDER — IOHEXOL 300 MG/ML  SOLN
125.0000 mL | Freq: Once | INTRAMUSCULAR | Status: AC | PRN
  Administered 2021-06-10: 125 mL via INTRAVENOUS

## 2021-06-10 NOTE — ED Provider Notes (Signed)
[facility] Emergency Department Provider Note  ____________________________________________   Event Date/Time   First MD Initiated Contact with Patient 06/10/21 0012     (approximate)  I have reviewed the triage vital signs and the nursing notes.   HISTORY  Chief Complaint Abdominal Pain    HPI Katie Gilbert is a 30 y.o. female who presents for acute onset lower middle abdominal pain that is sharp and aching and has been present for about 18 or 19 hours.  Started at 5 AM yesterday morning and has persisted until now.  It has been accompanied with which she estimates to be about 15 episodes of nausea and vomiting.  She has not been able to tolerate fluids or any other oral intake today.  She reports that it feels the same as when she had a kidney infection a few weeks ago but she has not had any burning when she urinates or increased urinary frequency or foul-smelling urine.  She says that she is just finishing up her menstrual period and has been the same as usual.  She says that she has no concerns about sexually transmitted disease and has not had any recent increase in vaginal secretions or pain.  She denies fever/chills, sore throat, chest pain, shortness of breath.  Reports that nothing in particular makes her pain or vomiting better or worse and that the symptoms are severe.     Past Medical History:  Diagnosis Date   Genital herpes    Type II diabetes mellitus with renal manifestations [facility])     Patient Active Problem List   Diagnosis Date Noted   Pyelonephritis of right kidney 05/08/2021   Migraine headache 05/08/2021   Obesity (BMI 35.0-39.9 without comorbidity) 05/08/2021   Diabetes mellitus without complication ([place])    Acquired absence of other right toe(s) ([place]) 04/12/2021   Diabetic foot ulcer ([place]) 01/16/2019    Past Surgical History:  Procedure Laterality Date   FOOT SURGERY Right    I & D EXTREMITY Right 01/17/2019    Procedure: IRRIGATION AND DEBRIDEMENT RIGHT FOOT;  Surgeon: Samara Deist, DPM;  Location: [facility] ORS;  Service: Podiatry;  Laterality: Right;    Prior to Admission medications   Medication Sig Start Date End Date Taking? Authorizing Provider  ondansetron (ZOFRAN ODT) 4 MG disintegrating tablet Allow 1-2 tablets to dissolve in your mouth every 8 hours as needed for nausea/vomiting 06/10/21  Yes Hinda Kehr, MD  acetaminophen (TYLENOL) 325 MG tablet Take 2 tablets (650 mg total) by mouth every 6 (six) hours as needed for mild pain (or Fever >/= 101). 01/20/19   Vaughan Basta, MD  blood glucose meter kit and supplies KIT Dispense based on patient and insurance preference. Use up to four times daily as directed. (FOR ICD-9 250.00, 250.01). 05/10/21   Wyvonnia Dusky, MD  butalbital-acetaminophen-caffeine [facility]) [phone] MG tablet Take 1-2 tablets by mouth every 6 (six) hours as needed for headache. 05/05/21 05/05/22  Lavonia Drafts, MD  INS SYRINGE/NEEDLE .5CC/27G 27G X 1/2" 0.5 ML MISC Needs enough syringes for lantus daily and aspart/novolog TID x 30 days. Dispense 1 box 05/10/21   Wyvonnia Dusky, MD  insulin aspart (NOVOLOG) 100 UNIT/ML injection Inject 4 Units into the skin 3 (three) times daily with meals. 05/10/21 06/09/21  Wyvonnia Dusky, MD  insulin glargine (LANTUS) 100 UNIT/ML injection Inject 0.15 mLs (15 Units total) into the skin daily. 05/11/21 06/10/21  Wyvonnia Dusky, MD  metFORMIN (GLUCOPHAGE) 500 MG tablet Take  1 tablet (500 mg total) by mouth 2 (two) times daily with a meal. 01/20/19 01/20/20  Vaughan Basta, MD  vitamin C (VITAMIN C) 500 MG tablet Take 1 tablet (500 mg total) by mouth 2 (two) times daily. Patient not taking: Reported on 05/08/2021 01/20/19   Vaughan Basta, MD    Allergies Patient has no known allergies.  Family History  Problem Relation Age of Onset   Hypertension Father    Heart attack Father    Diabetes Father    Diabetes  Maternal Grandmother     Social History Social History   Tobacco Use   Smoking status: Light Smoker    Packs/day: 0.20    Pack years: 0.00    Types: Cigarettes   Smokeless tobacco: Never  Vaping Use   Vaping Use: Never used  Substance Use Topics   Alcohol use: No   Drug use: No    Review of Systems Constitutional: No fever/chills Eyes: No visual changes. ENT: No sore throat. Cardiovascular: Denies chest pain. Respiratory: Denies shortness of breath. Gastrointestinal: Lower middle abdominal pain with nausea and vomiting.  No diarrhea, no constipation. Genitourinary: Negative for dysuria. Musculoskeletal: Negative for neck pain.  Negative for back pain. Integumentary: Negative for rash. Neurological: Negative for headaches, focal weakness or numbness.   ____________________________________________   PHYSICAL EXAM:  VITAL SIGNS: ED Triage Vitals  Enc Vitals Group     BP 06/09/21 2134 (!) 159/111     Pulse Rate 06/09/21 2134 (!) 109     Resp 06/09/21 2134 20     Temp 06/09/21 2134 98.9 F (37.2 C)     Temp Source 06/09/21 2134 Oral     SpO2 06/09/21 2134 100 %     Weight 06/09/21 2135 129.3 kg (285 lb)     Height --      Head Circumference --      Peak Flow --      Pain Score --      Pain Loc --      Pain Edu? --      Excl. in [place]? --     Constitutional: Alert and oriented.  Eyes: Conjunctivae are normal.  Head: Atraumatic. Nose: No congestion/rhinnorhea. Mouth/Throat: Patient is wearing a mask. Neck: No stridor.  No meningeal signs.   Cardiovascular: Normal rate, regular rhythm. Good peripheral circulation. Respiratory: Normal respiratory effort.  No retractions. Gastrointestinal: Obese.  Soft with tenderness to palpation in the suprapubic region.  No tenderness at McBurney's point.  No epigastric or right upper quadrant tenderness with negative Murphy sign. GU:  deferred Musculoskeletal: No lower extremity tenderness nor edema. No gross deformities of  extremities. Neurologic:  Normal speech and language. No gross focal neurologic deficits are appreciated.  Skin:  Skin is warm, dry and intact. Psychiatric: Mood and affect are normal. Speech and behavior are normal.  ____________________________________________   LABS (all labs ordered are listed, but only abnormal results are displayed)  Labs Reviewed  COMPREHENSIVE METABOLIC PANEL - Abnormal; Notable for the following components:      Result Value   CO2 20 (*)    Glucose, Bld 208 (*)    Total Protein 8.2 (*)    Total Bilirubin 1.4 (*)    All other components within normal limits  CBC - Abnormal; Notable for the following components:   WBC 12.3 (*)    All other components within normal limits  URINALYSIS, COMPLETE (UACMP) WITH MICROSCOPIC - Abnormal; Notable for the following components:   Color, Urine YELLOW (*)  APPearance HAZY (*)    Glucose, UA >=500 (*)    Hgb urine dipstick LARGE (*)    Ketones, ur 80 (*)    Protein, ur >=300 (*)    RBC / HPF >50 (*)    All other components within normal limits  URINE CULTURE  LIPASE, BLOOD  LACTIC ACID, PLASMA  MAGNESIUM  POC URINE PREG, ED  TROPONIN I (HIGH SENSITIVITY)   ____________________________________________  EKG  ED ECG REPORT I, Hinda Kehr, the attending physician, personally viewed and interpreted this ECG.  Date: 06/09/2021 EKG Time: 21: 43 Rate: 107 Rhythm: Sinus tachycardia QRS Axis: normal Intervals: normal, QTc 453 ms ST/T Wave abnormalities: normal Narrative Interpretation: no evidence of acute ischemia  ____________________________________________  RADIOLOGY I, Hinda Kehr, personally viewed and evaluated these images (plain radiographs) as part of my medical decision making, as well as reviewing the written report by the radiologist.  ED MD interpretation: No acute abnormality identified on a CT scan of the abdomen and pelvis.  Official radiology report(s): CT ABDOMEN PELVIS W  CONTRAST  Result Date: 06/10/2021 CLINICAL DATA:  Nausea, vomiting, acute nonlocalized abdominal pain EXAM: CT ABDOMEN AND PELVIS WITH CONTRAST TECHNIQUE: Multidetector CT imaging of the abdomen and pelvis was performed using the standard protocol following bolus administration of intravenous contrast. CONTRAST:  165m OMNIPAQUE IOHEXOL 300 MG/ML  SOLN COMPARISON:  05/08/2021 FINDINGS: Lower chest: The visualized lung bases are clear bilaterally. The visualized heart and pericardium are unremarkable. Hepatobiliary: No focal liver abnormality is seen. No gallstones, gallbladder wall thickening, or biliary dilatation. Pancreas: Unremarkable Spleen: Unremarkable Adrenals/Urinary Tract: Adrenal glands are unremarkable. Kidneys are normal, without renal calculi, focal lesion, or hydronephrosis. Bladder is unremarkable. Stomach/Bowel: Stomach is within normal limits. Appendix appears normal. No evidence of bowel wall thickening, distention, or inflammatory changes. Vascular/Lymphatic: No significant vascular findings are present. No enlarged abdominal or pelvic lymph nodes. Reproductive: Uterus and bilateral adnexa are unremarkable. Other: Tiny fat containing umbilical hernia. Rectum unremarkable. No free intraperitoneal gas or fluid. Musculoskeletal: No acute bone abnormality. No lytic or blastic bone lesions are identified. IMPRESSION: No acute intra-abdominal pathology identified. No definite radiographic explanation for the patient's reported symptoms. Electronically Signed   By: AFidela SalisburyMD   On: 06/10/2021 02:59    ____________________________________________   PROCEDURES   Procedure(s) performed (including Critical Care):  Procedures   ____________________________________________   INITIAL IMPRESSION / [place] / [place]/ [place] COURSE  As part of my medical decision making, I reviewed the following data within the [place] reviewed and incorporated,  Labs reviewed , EKG interpreted , Old chart reviewed, and Notes from prior ED visits   Differential diagnosis includes, but is not limited to, UTI/pyelonephritis, renal/ureteral colic, STD/PID, nonspecific intra-abdominal infection or colitis, appendicitis, biliary colic, pain associated with ovarian cyst.  Torsion unlikely.  The patient is on the cardiac monitor to evaluate for evidence of arrhythmia and/or significant heart rate changes.  The patient's vitals are notable for mild tachycardia but this could be due to pain.  She is also hypertensive.  Lab work obtained in triage shows an essentially normal comprehensive metabolic panel except for hyperglycemia in the setting of known diabetes.  She has a mild leukocytosis of 12.3.  Lactic acid is normal at 1.4.  No LFT elevation other than a total bilirubin of 1.4 which is unlikely to be clinically significant.  Lipase is normal at 21.  High-sensitivity troponin is normal at 4 and she is not  having any chest pain.  Given all the vomiting I added on a magnesium level.  We are still waiting a urine specimen for urinalysis and pregnancy test.  Given her tenderness to palpation in the suprapubic region, I suspect I will obtain a CT scan of the abdomen and pelvis given that this is the second time in about 3 weeks that she has had similar issues and she was hospitalized the last time.  It is likely going to be worth ruling out pyelonephritis demonstrated on CT scan as well as the possibility of an obstructive ureteral stone in the setting of some tachycardia and leukocytosis.  For discomfort and providing Toradol 15 mg IV and droperidol 2.5 mg IV for both pain control and antiemetic.  Clinical Course as of 06/10/21 4650  Fri Jun 10, 2021  3546 Patient has been sleeping.  I woke her up easily and she feels better now.  She has some red blood cells in her urine which is likely from the end of her menstrual cycle.  She did have 80 ketones in her urine but she  is no longer vomiting and I provided her a liter of lactated Ringer's.  The rest of her labs are all reassuring.  There is no evidence of any acute abnormality on her CT scan.  I will provide her with Zofran and encouraged rehydration.  She and her mother understand and agree with the plan.  I gave my usual and customary return precautions.  Vital signs have returned to normal with a heart rate down below 100. [CF]    Clinical Course User Index [CF] Hinda Kehr, MD     ____________________________________________  FINAL CLINICAL IMPRESSION(S) / ED DIAGNOSES  Final diagnoses:  Non-intractable vomiting with nausea, unspecified vomiting type  Lower abdominal pain     MEDICATIONS GIVEN DURING THIS VISIT:  Medications  lactated ringers bolus 1,000 mL (0 mLs Intravenous Stopped 06/10/21 0425)  droperidol (INAPSINE) 2.5 MG/ML injection 2.5 mg (2.5 mg Intravenous Given 06/10/21 0102)  ketorolac (TORADOL) 30 MG/ML injection 15 mg (15 mg Intravenous Given 06/10/21 0100)  iohexol (OMNIPAQUE) 300 MG/ML solution 125 mL (125 mLs Intravenous Contrast Given 06/10/21 0204)     ED Discharge Orders          Ordered    ondansetron (ZOFRAN ODT) 4 MG disintegrating tablet        06/10/21 0328             Note:  This document was prepared using Dragon voice recognition software and may include unintentional dictation errors.   Hinda Kehr, MD 06/10/21 [id number]

## 2021-06-10 NOTE — Discharge Instructions (Addendum)
You have been seen in the Emergency Department (ED) for abdominal pain with nausea and vomiting.  Your evaluation did not identify a clear cause of your symptoms but was generally reassuring.  You were a little bit dehydrated, but we gave you some IV fluids, and you need to focus on drinking more clear liquids (Gatorade, water, etc) over the next couple of days.  Please follow up as instructed above regarding today's emergent visit and the symptoms that are bothering you.  Return to the ED if your abdominal pain worsens or fails to improve, you develop bloody vomiting, bloody diarrhea, you are unable to tolerate fluids due to vomiting, fever greater than 101, or other symptoms that concern you.

## 2021-06-12 LAB — URINE CULTURE
Culture: 10000 — AB
Special Requests: NORMAL

## 2021-06-13 DIAGNOSIS — Z794 Long term (current) use of insulin: Secondary | ICD-10-CM

## 2021-06-13 DIAGNOSIS — I10 Essential (primary) hypertension: Secondary | ICD-10-CM | POA: Insufficient documentation

## 2021-06-13 DIAGNOSIS — F411 Generalized anxiety disorder: Secondary | ICD-10-CM | POA: Insufficient documentation

## 2021-06-13 NOTE — Consult Note (Signed)
Discussed case with Dr. Scotty Court. No urinary symptoms, plan not to treat at this time. Only grew 10L colonies/mL with klebsiella pneumoniaie.   Thanks,   Paschal Dopp, PharmD, BCPS

## 2021-06-16 IMAGING — CT CT HEAD W/O CM
3 series · 16 of 47 positions shown, 19 images · non-contrast
Comparison: None.

CLINICAL DATA: Headache, nausea

EXAM:
CT HEAD WITHOUT CONTRAST
TECHNIQUE: Contiguous axial images were obtained from the base of the skull
through the vertex without intravenous contrast.

[Series 3: head wo · axial · 0.45mm/px · z∈[+129,+254]mm · 10 of 31 slices shown, 13 images]
[im 3/31  brain]
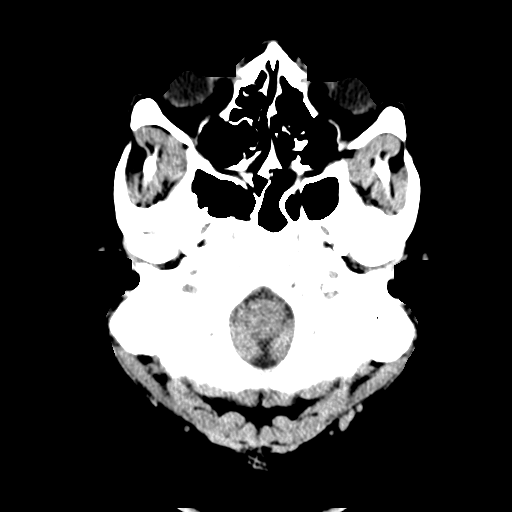
[im 3/31  bone]
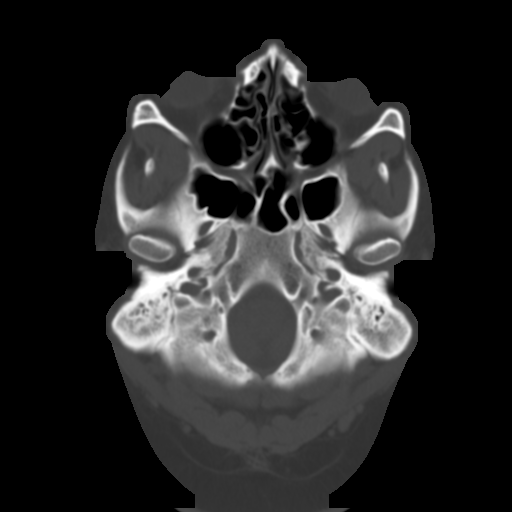
[im 6/31  brain]
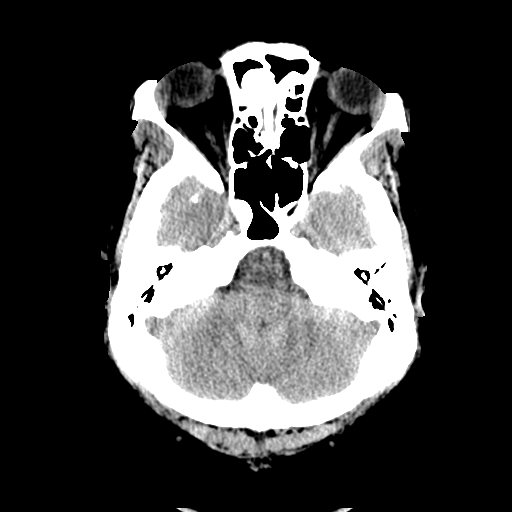
[im 9/31  brain]
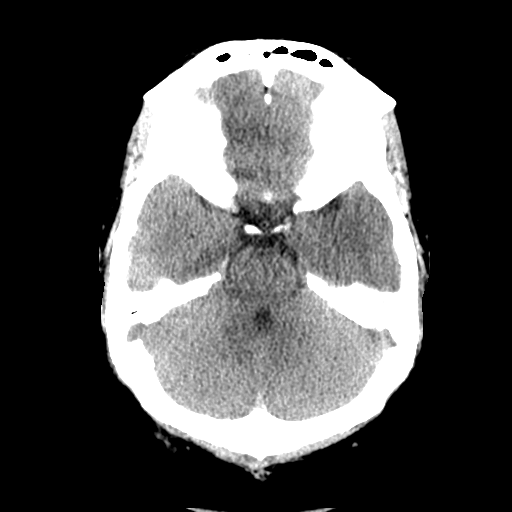
[im 11/31  brain]
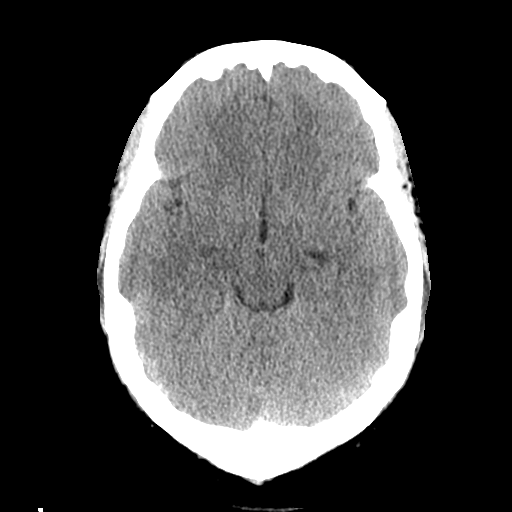
[im 14/31  brain]
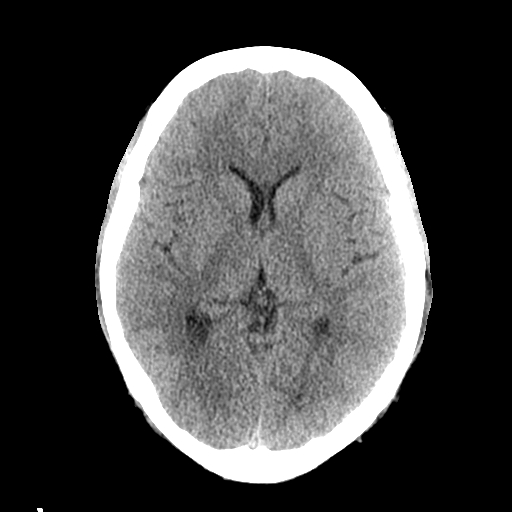
[im 14/31  bone]
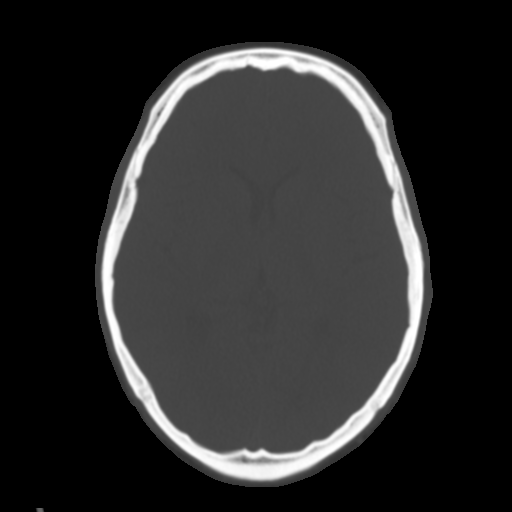
[im 17/31  brain]
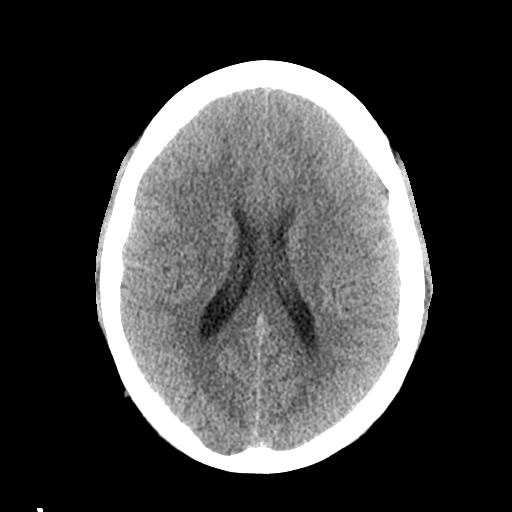
[im 20/31  brain]
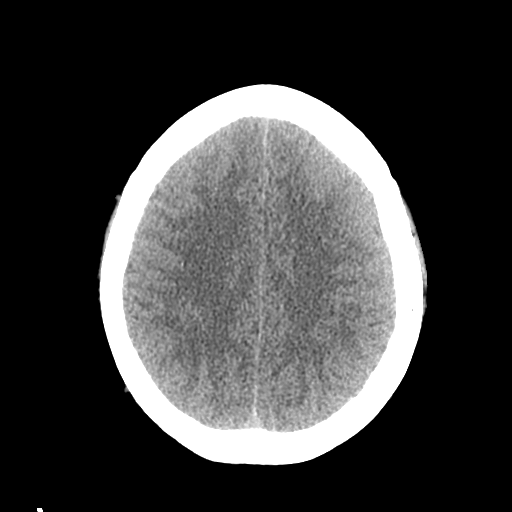
[im 23/31  brain]
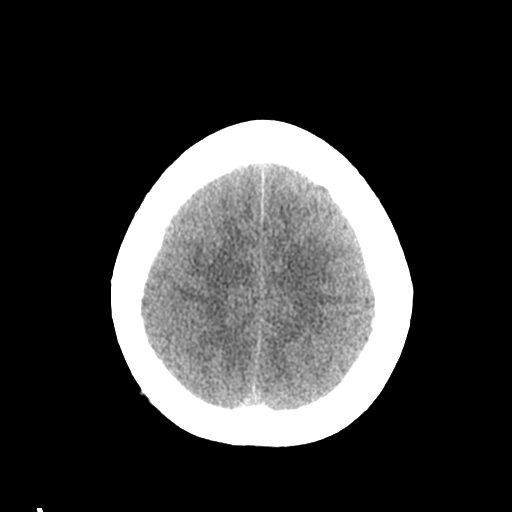
[im 25/31  brain]
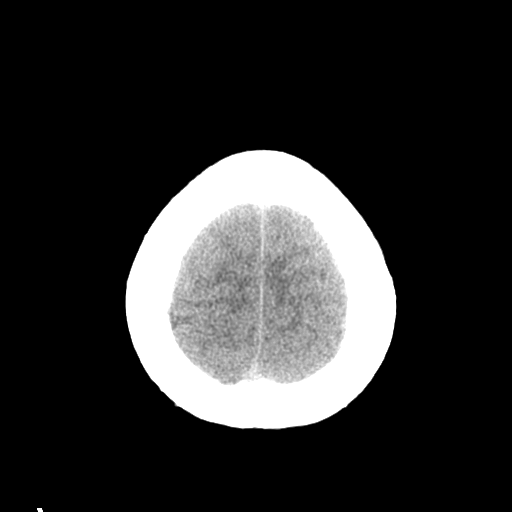
[im 25/31  bone]
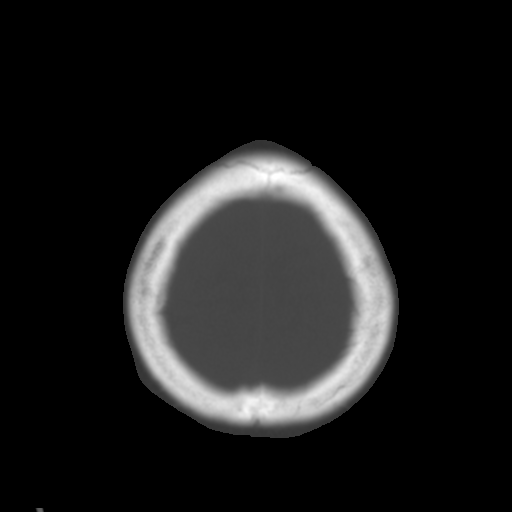
[im 28/31  brain]
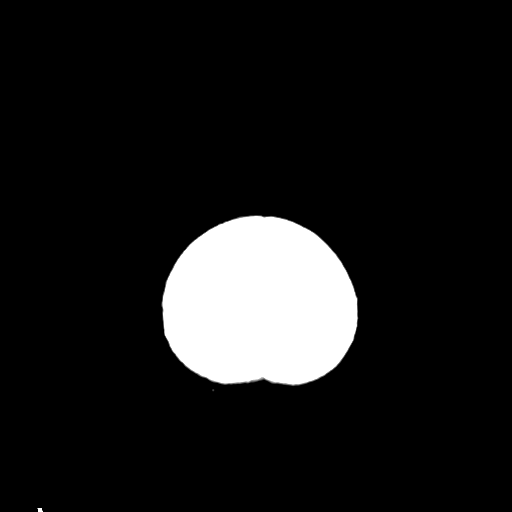

[Series 4: coronal soft tissue · coronal · 0.34mm/px · 3 of 73 slices shown]
[im 25/73  brain]
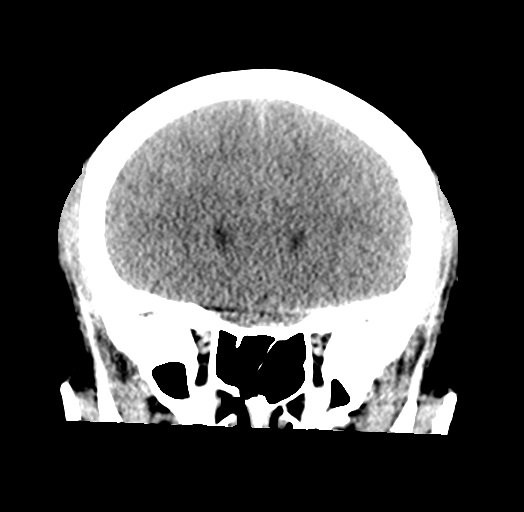
[im 33/73  brain]
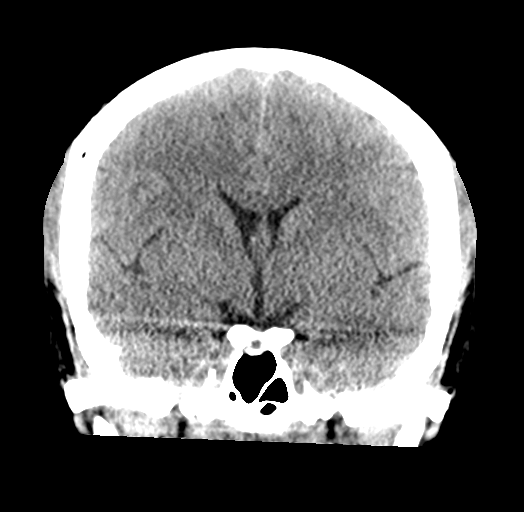
[im 41/73  brain]
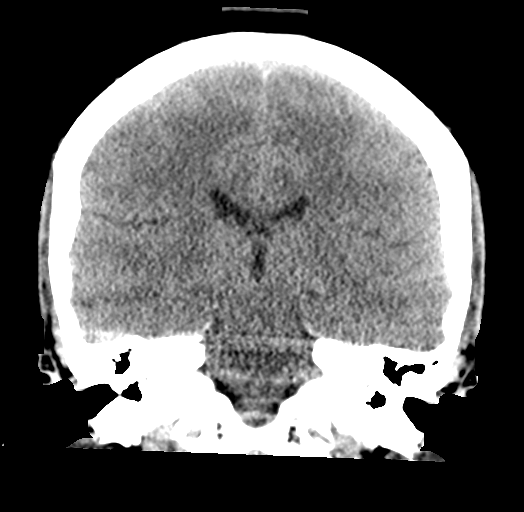

[Series 5: sagittal soft tissue · sagittal · 0.34mm/px · 3 of 57 slices shown]
[im 19/57  brain]
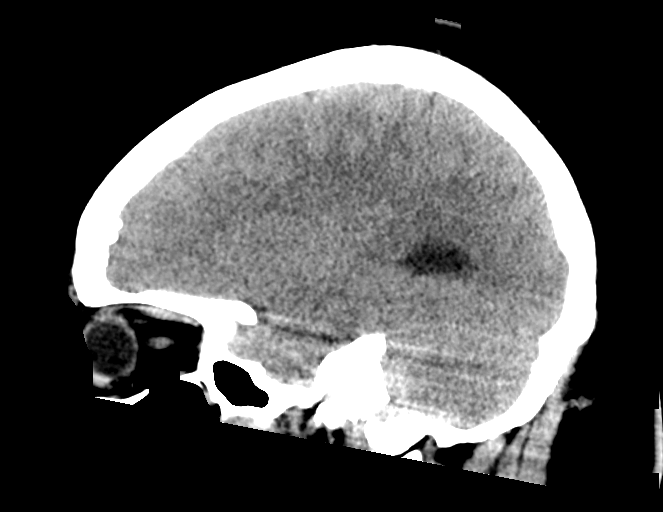
[im 29/57  brain]
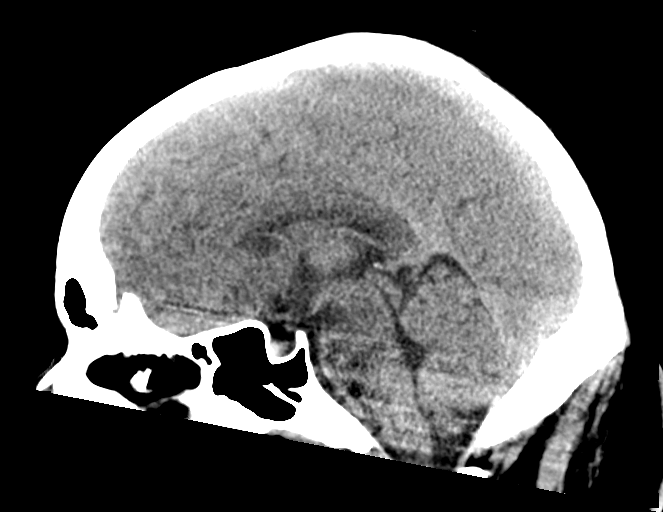
[im 38/57  brain]
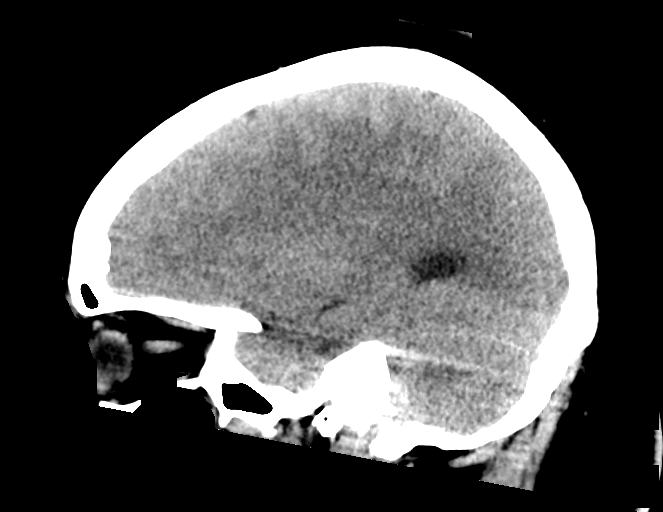

[16 of 47 positions shown; findings below may reference images not displayed]

FINDINGS: Brain: No evidence of acute infarction, hemorrhage, hydrocephalus,
extra-axial collection or mass lesion/mass effect.

Vascular: No hyperdense vessel or unexpected calcification.

Skull: Normal. Negative for fracture or focal lesion.

Sinuses/Orbits: No acute finding.

Other: None.
IMPRESSION: No acute intracranial findings.

## 2021-07-22 DIAGNOSIS — R112 Nausea with vomiting, unspecified: Secondary | ICD-10-CM

## 2021-07-22 DIAGNOSIS — R197 Diarrhea, unspecified: Secondary | ICD-10-CM

## 2021-07-22 DIAGNOSIS — E876 Hypokalemia: Secondary | ICD-10-CM | POA: Insufficient documentation

## 2021-07-22 HISTORY — DX: Nausea with vomiting, unspecified: R11.2

## 2021-07-22 HISTORY — DX: Diarrhea, unspecified: R19.7

## 2021-07-24 DIAGNOSIS — A0472 Enterocolitis due to Clostridium difficile, not specified as recurrent: Secondary | ICD-10-CM

## 2021-07-24 HISTORY — DX: Enterocolitis due to Clostridium difficile, not specified as recurrent: A04.72

## 2021-09-08 DIAGNOSIS — R1013 Epigastric pain: Secondary | ICD-10-CM | POA: Insufficient documentation

## 2021-09-25 ENCOUNTER — Other Ambulatory Visit: Payer: Self-pay

## 2021-09-25 DIAGNOSIS — Z794 Long term (current) use of insulin: Secondary | ICD-10-CM | POA: Diagnosis not present

## 2021-09-25 DIAGNOSIS — E119 Type 2 diabetes mellitus without complications: Secondary | ICD-10-CM | POA: Diagnosis not present

## 2021-09-25 DIAGNOSIS — G2409 Other drug induced dystonia: Secondary | ICD-10-CM | POA: Insufficient documentation

## 2021-09-25 DIAGNOSIS — F17210 Nicotine dependence, cigarettes, uncomplicated: Secondary | ICD-10-CM | POA: Diagnosis not present

## 2021-09-25 DIAGNOSIS — Z7984 Long term (current) use of oral hypoglycemic drugs: Secondary | ICD-10-CM | POA: Insufficient documentation

## 2021-09-25 DIAGNOSIS — M62838 Other muscle spasm: Secondary | ICD-10-CM | POA: Diagnosis not present

## 2021-09-25 DIAGNOSIS — M542 Cervicalgia: Secondary | ICD-10-CM | POA: Diagnosis present

## 2021-09-25 LAB — CBC
HCT: 27.1 % — ABNORMAL LOW (ref 36.0–46.0)
Hemoglobin: 9.7 g/dL — ABNORMAL LOW (ref 12.0–15.0)
MCH: 29.3 pg (ref 26.0–34.0)
MCHC: 35.8 g/dL (ref 30.0–36.0)
MCV: 81.9 fL (ref 80.0–100.0)
Platelets: 250 10*3/uL (ref 150–400)
RBC: 3.31 MIL/uL — ABNORMAL LOW (ref 3.87–5.11)
RDW: 13.1 % (ref 11.5–15.5)
WBC: 7.1 10*3/uL (ref 4.0–10.5)
nRBC: 0 % (ref 0.0–0.2)

## 2021-09-25 LAB — COMPREHENSIVE METABOLIC PANEL
ALT: 23 U/L (ref 0–44)
AST: 26 U/L (ref 15–41)
Albumin: 3.3 g/dL — ABNORMAL LOW (ref 3.5–5.0)
Alkaline Phosphatase: 56 U/L (ref 38–126)
Anion gap: 10 (ref 5–15)
BUN: 8 mg/dL (ref 6–20)
CO2: 24 mmol/L (ref 22–32)
Calcium: 9 mg/dL (ref 8.9–10.3)
Chloride: 99 mmol/L (ref 98–111)
Creatinine, Ser: 0.84 mg/dL (ref 0.44–1.00)
GFR, Estimated: >60 mL/min (ref >60)
Glucose, Bld: 159 mg/dL — ABNORMAL HIGH (ref 70–99)
Potassium: 3.2 mmol/L — ABNORMAL LOW (ref 3.5–5.1)
Sodium: 133 mmol/L — ABNORMAL LOW (ref 135–145)
Total Bilirubin: 0.6 mg/dL (ref 0.3–1.2)
Total Protein: 6.3 g/dL — ABNORMAL LOW (ref 6.5–8.1)

## 2021-09-25 NOTE — ED Triage Notes (Addendum)
Pt states is here because she is having neck spasms. Pt states they began approx 5-6 hour spta. Per pt had a dose of haldol last night once for n/v. Pt states has also been having lower extremity weakness for last several months. Pt denies fever or rash. Pt is also being treated for c.diff.

## 2021-09-26 ENCOUNTER — Emergency Department
Admission: EM | Admit: 2021-09-26 | Discharge: 2021-09-26 | Disposition: A | Discharge status: Home / Self Care | Payer: Managed Care, Other (non HMO) | Attending: Emergency Medicine | Admitting: Emergency Medicine

## 2021-09-26 DIAGNOSIS — G2402 Drug induced acute dystonia: Secondary | ICD-10-CM

## 2021-09-26 MED ORDER — LACTATED RINGERS IV BOLUS
1000.0000 mL | Freq: Once | INTRAVENOUS | Status: AC
  Administered 2021-09-26: 1000 mL via INTRAVENOUS

## 2021-09-26 MED ORDER — DIPHENHYDRAMINE HCL 50 MG/ML IJ SOLN
50.0000 mg | Freq: Once | INTRAMUSCULAR | Status: AC
  Administered 2021-09-26: 50 mg via INTRAVENOUS
  Filled 2021-09-26: qty 1

## 2021-09-26 MED ORDER — BENZTROPINE MESYLATE 1 MG PO TABS: 2.0000 mg | ORAL_TABLET | Freq: Two times a day (BID) | ORAL | 0 refills | Status: DC | PRN

## 2021-09-26 MED ORDER — BENZTROPINE MESYLATE 1 MG/ML IJ SOLN
2.0000 mg | Freq: Once | INTRAMUSCULAR | Status: AC
  Administered 2021-09-26: 2 mg via INTRAVENOUS
  Filled 2021-09-26: qty 2

## 2021-09-26 NOTE — Discharge Instructions (Signed)
Make sure to never take Haldol or any similar medication in the future to prevent these episodes from happening again.  If you start having spasms at home make sure to take the benztropine that was prescribed to you.  If that does not resolve your symptoms return to the emergency room

## 2021-09-26 NOTE — ED Notes (Signed)
This RN attempted x 2 for IV, was unsuccessful.

## 2021-09-26 NOTE — ED Provider Notes (Signed)
[facility] Emergency Department Provider Note  ____________________________________________  Time seen: Approximately 2:18 AM  I have reviewed the triage vital signs and the nursing notes.   HISTORY  Chief Complaint Neck Pain   HPI Katie Gilbert is a 30 y.o. female with a history of diabetes, obesity, C. difficile infection who presents for evaluation of neck spasms.  Patient reports that she was seen in the hospital yesterday for abdominal pain, nausea and vomiting.  She was diagnosed with a C. difficile colitis and is currently on vancomycin.  She was given a dose of Haldol.  This afternoon she started having pretty severe neck spasms bilaterally.  Patient denies ever receiving Haldol in the past.  She denies headache, neck pain, chest pain, shortness of breath.   Past Medical History:  Diagnosis Date   Genital herpes    Type II diabetes mellitus with renal manifestations [facility])     Patient Active Problem List   Diagnosis Date Noted   Pyelonephritis of right kidney 05/08/2021   Migraine headache 05/08/2021   Obesity (BMI 35.0-39.9 without comorbidity) 05/08/2021   Diabetes mellitus without complication ([place])    Acquired absence of other right toe(s) ([place]) 04/12/2021   Diabetic foot ulcer ([place]) 01/16/2019    Past Surgical History:  Procedure Laterality Date   FOOT SURGERY Right    I & D EXTREMITY Right 01/17/2019   Procedure: IRRIGATION AND DEBRIDEMENT RIGHT FOOT;  Surgeon: Samara Deist, DPM;  Location: [facility] ORS;  Service: Podiatry;  Laterality: Right;    Prior to Admission medications   Medication Sig Start Date End Date Taking? Authorizing Provider  acetaminophen (TYLENOL) 325 MG tablet Take 2 tablets (650 mg total) by mouth every 6 (six) hours as needed for mild pain (or Fever >/= 101). 01/20/19   Vaughan Basta, MD  blood glucose meter kit and supplies KIT Dispense based on patient and insurance preference. Use up to four times  daily as directed. (FOR ICD-9 250.00, 250.01). 05/10/21   Wyvonnia Dusky, MD  butalbital-acetaminophen-caffeine [facility]) [phone] MG tablet Take 1-2 tablets by mouth every 6 (six) hours as needed for headache. 05/05/21 05/05/22  Lavonia Drafts, MD  INS SYRINGE/NEEDLE .5CC/27G 27G X 1/2" 0.5 ML MISC Needs enough syringes for lantus daily and aspart/novolog TID x 30 days. Dispense 1 box 05/10/21   Wyvonnia Dusky, MD  insulin aspart (NOVOLOG) 100 UNIT/ML injection Inject 4 Units into the skin 3 (three) times daily with meals. 05/10/21 06/09/21  Wyvonnia Dusky, MD  insulin glargine (LANTUS) 100 UNIT/ML injection Inject 0.15 mLs (15 Units total) into the skin daily. 05/11/21 06/10/21  Wyvonnia Dusky, MD  metFORMIN (GLUCOPHAGE) 500 MG tablet Take 1 tablet (500 mg total) by mouth 2 (two) times daily with a meal. 01/20/19 01/20/20  Vaughan Basta, MD  ondansetron (ZOFRAN ODT) 4 MG disintegrating tablet Allow 1-2 tablets to dissolve in your mouth every 8 hours as needed for nausea/vomiting 06/10/21   Hinda Kehr, MD  vitamin C (VITAMIN C) 500 MG tablet Take 1 tablet (500 mg total) by mouth 2 (two) times daily. Patient not taking: Reported on 05/08/2021 01/20/19   Vaughan Basta, MD    Allergies Haldol [haloperidol]  Family History  Problem Relation Age of Onset   Hypertension Father    Heart attack Father    Diabetes Father    Diabetes Maternal Grandmother     Social History Social History   Tobacco Use   Smoking status: Light Smoker  Packs/day: 0.20    Types: Cigarettes   Smokeless tobacco: Never  Vaping Use   Vaping Use: Never used  Substance Use Topics   Alcohol use: No   Drug use: No    Review of Systems  Constitutional: Negative for fever. Eyes: Negative for visual changes. ENT: Negative for sore throat. Neck: + neck spasms Cardiovascular: Negative for chest pain. Respiratory: Negative for shortness of breath. Gastrointestinal: Negative for  abdominal pain, vomiting or diarrhea. Genitourinary: Negative for dysuria. Musculoskeletal: Negative for back pain. Skin: Negative for rash. Neurological: Negative for headaches, weakness or numbness. Psych: No SI or HI  ____________________________________________   PHYSICAL EXAM:  VITAL SIGNS: ED Triage Vitals  Enc Vitals Group     BP 09/25/21 2319 127/81     Pulse Rate 09/25/21 2319 (!) 113     Resp 09/25/21 2319 18     Temp 09/25/21 2319 98.3 F (36.8 C)     Temp Source 09/25/21 2319 Oral     SpO2 09/25/21 2319 100 %     Weight 09/25/21 2318 225 lb (102.1 kg)     Height 09/25/21 2318 6' 3" (1.905 m)     Head Circumference --      Peak Flow --      Pain Score 09/25/21 2318 10     Pain Loc --      Pain Edu? --      Excl. in [place]? --     Constitutional: Alert and oriented. Well appearing  HEENT:      Head: Normocephalic and atraumatic.         Eyes: Conjunctivae are normal. Sclera is non-icteric.       Mouth/Throat: Mucous membranes are moist.       Neck: Supple with no signs of meningismus. Bilateral severe muscle spasms Cardiovascular: Regular rate and rhythm. No murmurs, gallops, or rubs. 2+ symmetrical distal pulses are present in all extremities. No JVD. Respiratory: Normal respiratory effort. Lungs are clear to auscultation bilaterally.  Gastrointestinal: Soft, non tender, and non distended with positive bowel sounds. No rebound or guarding. Genitourinary: No CVA tenderness. Musculoskeletal:  No edema, cyanosis, or erythema of extremities. Neurologic: Normal speech and language. Face is symmetric. Moving all extremities. No gross focal neurologic deficits are appreciated. Skin: Skin is warm, dry and intact. No rash noted. Psychiatric: Mood and affect are normal. Speech and behavior are normal.  ____________________________________________   LABS (all labs ordered are listed, but only abnormal results are displayed)  Labs Reviewed  CBC - Abnormal; Notable for  the following components:      Result Value   RBC 3.31 (*)    Hemoglobin 9.7 (*)    HCT 27.1 (*)    All other components within normal limits  COMPREHENSIVE METABOLIC PANEL - Abnormal; Notable for the following components:   Sodium 133 (*)    Potassium 3.2 (*)    Glucose, Bld 159 (*)    Total Protein 6.3 (*)    Albumin 3.3 (*)    All other components within normal limits   ____________________________________________  EKG  none  ____________________________________________  RADIOLOGY  none  ____________________________________________   PROCEDURES  Procedure(s) performed: None Procedures   Critical Care performed:  None ____________________________________________   INITIAL IMPRESSION / ASSESSMENT AND PLAN / ED COURSE   30 y.o. female with a history of diabetes, obesity, C. difficile infection who presents for evaluation of neck spasms.  Patient presents with a dystonic reaction from Haldol that she received yesterday.  After receiving benztropine and Benadryl her symptoms resolved completely.  I have added Haldol to her allergy list.  Discussed never taking Haldol in the future.  We will provide patient with a prescription for benztropine and because symptoms recur      _____________________________________________ Please note:  Patient was evaluated in Emergency Department today for the symptoms described in the history of present illness. Patient was evaluated in the context of the global COVID-19 pandemic, which necessitated consideration that the patient might be at risk for infection with the SARS-CoV-2 virus that causes COVID-19. Institutional protocols and algorithms that pertain to the evaluation of patients at risk for COVID-19 are in a state of rapid change based on information released by regulatory bodies including the CDC and federal and state organizations. These policies and algorithms were followed during the patient's care in the ED.  Some ED evaluations  and interventions may be delayed as a result of limited staffing during the pandemic.   [place] Controlled Substance Database was reviewed by me. ____________________________________________   FINAL CLINICAL IMPRESSION(S) / ED DIAGNOSES   Final diagnoses:  Dystonic drug reaction      NEW MEDICATIONS STARTED DURING THIS VISIT:  ED Discharge Orders     None        Note:  This document was prepared using Dragon voice recognition software and may include unintentional dictation errors.     Veronese, [place], MD 09/26/21 0245  

## 2021-10-04 ENCOUNTER — Other Ambulatory Visit: Payer: Self-pay | Admitting: Gastroenterology

## 2021-10-04 DIAGNOSIS — R5383 Other fatigue: Secondary | ICD-10-CM

## 2021-10-04 DIAGNOSIS — R634 Abnormal weight loss: Secondary | ICD-10-CM

## 2021-10-04 DIAGNOSIS — R112 Nausea with vomiting, unspecified: Secondary | ICD-10-CM

## 2021-10-04 DIAGNOSIS — R1084 Generalized abdominal pain: Secondary | ICD-10-CM

## 2021-10-04 DIAGNOSIS — R1013 Epigastric pain: Secondary | ICD-10-CM

## 2021-11-01 ENCOUNTER — Other Ambulatory Visit: Payer: Self-pay | Admitting: Internal Medicine

## 2021-11-03 LAB — SURGICAL PATHOLOGY

## 2021-11-11 ENCOUNTER — Emergency Department: Payer: Managed Care, Other (non HMO)

## 2021-11-11 ENCOUNTER — Other Ambulatory Visit: Payer: Self-pay

## 2021-11-11 DIAGNOSIS — Z20822 Contact with and (suspected) exposure to covid-19: Secondary | ICD-10-CM | POA: Diagnosis not present

## 2021-11-11 DIAGNOSIS — Z89421 Acquired absence of other right toe(s): Secondary | ICD-10-CM | POA: Insufficient documentation

## 2021-11-11 DIAGNOSIS — Z794 Long term (current) use of insulin: Secondary | ICD-10-CM | POA: Insufficient documentation

## 2021-11-11 DIAGNOSIS — Z79899 Other long term (current) drug therapy: Secondary | ICD-10-CM | POA: Diagnosis not present

## 2021-11-11 DIAGNOSIS — Z7984 Long term (current) use of oral hypoglycemic drugs: Secondary | ICD-10-CM | POA: Insufficient documentation

## 2021-11-11 DIAGNOSIS — I959 Hypotension, unspecified: Secondary | ICD-10-CM | POA: Insufficient documentation

## 2021-11-11 DIAGNOSIS — E669 Obesity, unspecified: Secondary | ICD-10-CM | POA: Insufficient documentation

## 2021-11-11 DIAGNOSIS — E1129 Type 2 diabetes mellitus with other diabetic kidney complication: Principal | ICD-10-CM | POA: Insufficient documentation

## 2021-11-11 DIAGNOSIS — K3184 Gastroparesis: Secondary | ICD-10-CM | POA: Insufficient documentation

## 2021-11-11 DIAGNOSIS — Z6835 Body mass index (BMI) 35.0-35.9, adult: Secondary | ICD-10-CM | POA: Diagnosis not present

## 2021-11-11 DIAGNOSIS — R55 Syncope and collapse: Secondary | ICD-10-CM | POA: Diagnosis present

## 2021-11-11 DIAGNOSIS — F17210 Nicotine dependence, cigarettes, uncomplicated: Secondary | ICD-10-CM | POA: Diagnosis not present

## 2021-11-11 LAB — BASIC METABOLIC PANEL
Anion gap: 10 (ref 5–15)
BUN: 10 mg/dL (ref 6–20)
CO2: 23 mmol/L (ref 22–32)
Calcium: 9.5 mg/dL (ref 8.9–10.3)
Chloride: 98 mmol/L (ref 98–111)
Creatinine, Ser: 1 mg/dL (ref 0.44–1.00)
GFR, Estimated: >60 mL/min (ref >60)
Glucose, Bld: 250 mg/dL — ABNORMAL HIGH (ref 70–99)
Potassium: 4.1 mmol/L (ref 3.5–5.1)
Sodium: 131 mmol/L — ABNORMAL LOW (ref 135–145)

## 2021-11-11 LAB — CBC
HCT: 35.5 % — ABNORMAL LOW (ref 36.0–46.0)
Hemoglobin: 12.4 g/dL (ref 12.0–15.0)
MCH: 29.3 pg (ref 26.0–34.0)
MCHC: 34.9 g/dL (ref 30.0–36.0)
MCV: 83.9 fL (ref 80.0–100.0)
Platelets: 335 10*3/uL (ref 150–400)
RBC: 4.23 MIL/uL (ref 3.87–5.11)
RDW: 12 % (ref 11.5–15.5)
WBC: 10.2 10*3/uL (ref 4.0–10.5)
nRBC: 0 % (ref 0.0–0.2)

## 2021-11-11 MED ORDER — SODIUM CHLORIDE 0.9 % IV BOLUS
1000.0000 mL | Freq: Once | INTRAVENOUS | Status: AC
  Administered 2021-11-11: 1000 mL via INTRAVENOUS

## 2021-11-11 NOTE — ED Triage Notes (Signed)
Pt BIB EMS due to having a syncopal episode. Pt has hx of gastroparesis and has not been able to eat and drink normally due to n/v. PT hypotensive in 70s. Pt a&ox4.

## 2021-11-12 ENCOUNTER — Emergency Department: Payer: Managed Care, Other (non HMO)

## 2021-11-12 ENCOUNTER — Observation Stay
Admission: EM | Admit: 2021-11-12 | Discharge: 2021-11-14 | Disposition: A | Discharge status: Home / Self Care | Payer: Managed Care, Other (non HMO) | Attending: Internal Medicine | Admitting: Internal Medicine

## 2021-11-12 ENCOUNTER — Observation Stay: Payer: Managed Care, Other (non HMO)

## 2021-11-12 ENCOUNTER — Other Ambulatory Visit: Payer: Self-pay

## 2021-11-12 ENCOUNTER — Encounter: Payer: Self-pay | Admitting: Internal Medicine

## 2021-11-12 DIAGNOSIS — R55 Syncope and collapse: Secondary | ICD-10-CM | POA: Diagnosis not present

## 2021-11-12 DIAGNOSIS — Z794 Long term (current) use of insulin: Secondary | ICD-10-CM

## 2021-11-12 DIAGNOSIS — I959 Hypotension, unspecified: Secondary | ICD-10-CM

## 2021-11-12 DIAGNOSIS — E1159 Type 2 diabetes mellitus with other circulatory complications: Secondary | ICD-10-CM

## 2021-11-12 DIAGNOSIS — E119 Type 2 diabetes mellitus without complications: Secondary | ICD-10-CM

## 2021-11-12 DIAGNOSIS — K3184 Gastroparesis: Secondary | ICD-10-CM | POA: Diagnosis present

## 2021-11-12 DIAGNOSIS — R109 Unspecified abdominal pain: Secondary | ICD-10-CM

## 2021-11-12 DIAGNOSIS — E1143 Type 2 diabetes mellitus with diabetic autonomic (poly)neuropathy: Secondary | ICD-10-CM | POA: Diagnosis present

## 2021-11-12 DIAGNOSIS — R112 Nausea with vomiting, unspecified: Secondary | ICD-10-CM

## 2021-11-12 LAB — URINALYSIS, ROUTINE W REFLEX MICROSCOPIC
Bilirubin Urine: NEGATIVE
Glucose, UA: NEGATIVE mg/dL
Ketones, ur: 15 mg/dL — AB
Leukocytes,Ua: NEGATIVE
Nitrite: NEGATIVE
Protein, ur: 100 mg/dL — AB
Specific Gravity, Urine: 1.025 (ref 1.005–1.030)
pH: 6 (ref 5.0–8.0)

## 2021-11-12 LAB — PROCALCITONIN: Procalcitonin: 0.51 ng/mL

## 2021-11-12 LAB — HEPATIC FUNCTION PANEL
ALT: 13 U/L (ref 0–44)
AST: 17 U/L (ref 15–41)
Albumin: 4.2 g/dL (ref 3.5–5.0)
Alkaline Phosphatase: 78 U/L (ref 38–126)
Bilirubin, Direct: 0.2 mg/dL (ref 0.0–0.2)
Indirect Bilirubin: 1 mg/dL — ABNORMAL HIGH (ref 0.3–0.9)
Total Bilirubin: 1.2 mg/dL (ref 0.3–1.2)
Total Protein: 7.7 g/dL (ref 6.5–8.1)

## 2021-11-12 LAB — URINALYSIS, MICROSCOPIC (REFLEX): Bacteria, UA: NONE SEEN

## 2021-11-12 LAB — RESP PANEL BY RT-PCR (FLU A&B, COVID) ARPGX2
Influenza A by PCR: NEGATIVE
Influenza B by PCR: NEGATIVE
SARS Coronavirus 2 by RT PCR: NEGATIVE

## 2021-11-12 LAB — CBG MONITORING, ED
Glucose-Capillary: 175 mg/dL — ABNORMAL HIGH (ref 70–99)
Glucose-Capillary: 178 mg/dL — ABNORMAL HIGH (ref 70–99)

## 2021-11-12 LAB — LIPASE, BLOOD: Lipase: 21 U/L (ref 11–51)

## 2021-11-12 LAB — LACTIC ACID, PLASMA
Lactic Acid, Venous: 1.2 mmol/L (ref 0.5–1.9)
Lactic Acid, Venous: 1.2 mmol/L (ref 0.5–1.9)

## 2021-11-12 LAB — PREGNANCY, URINE: Preg Test, Ur: NEGATIVE

## 2021-11-12 LAB — GLUCOSE, CAPILLARY: Glucose-Capillary: 95 mg/dL (ref 70–99)

## 2021-11-12 MED ORDER — IOHEXOL 300 MG/ML  SOLN
100.0000 mL | Freq: Once | INTRAMUSCULAR | Status: AC | PRN
  Administered 2021-11-12: 100 mL via INTRAVENOUS

## 2021-11-12 MED ORDER — METRONIDAZOLE 500 MG/100ML IV SOLN
500.0000 mg | Freq: Once | INTRAVENOUS | Status: AC
  Administered 2021-11-12: 500 mg via INTRAVENOUS
  Filled 2021-11-12: qty 100

## 2021-11-12 MED ORDER — INSULIN ASPART 100 UNIT/ML IJ SOLN
0.0000 [IU] | Freq: Three times a day (TID) | INTRAMUSCULAR | Status: DC
  Administered 2021-11-12 (×2): 3 [IU] via SUBCUTANEOUS
  Administered 2021-11-13: 13:00:00 2 [IU] via SUBCUTANEOUS
  Administered 2021-11-13: 08:00:00 5 [IU] via SUBCUTANEOUS
  Administered 2021-11-14 (×2): 3 [IU] via SUBCUTANEOUS
  Filled 2021-11-12 (×6): qty 1

## 2021-11-12 MED ORDER — PANTOPRAZOLE SODIUM 40 MG IV SOLR
40.0000 mg | INTRAVENOUS | Status: DC
  Administered 2021-11-12 – 2021-11-14 (×3): 40 mg via INTRAVENOUS
  Filled 2021-11-12 (×3): qty 40

## 2021-11-12 MED ORDER — ONDANSETRON HCL 4 MG/2ML IJ SOLN
4.0000 mg | Freq: Once | INTRAMUSCULAR | Status: AC
  Administered 2021-11-12: 4 mg via INTRAVENOUS
  Filled 2021-11-12: qty 2

## 2021-11-12 MED ORDER — VANCOMYCIN HCL 500 MG/100ML IV SOLN
500.0000 mg | Freq: Once | INTRAVENOUS | Status: DC
  Filled 2021-11-12: qty 100

## 2021-11-12 MED ORDER — ACETAMINOPHEN 325 MG PO TABS
650.0000 mg | ORAL_TABLET | Freq: Four times a day (QID) | ORAL | Status: DC | PRN
  Administered 2021-11-13: 20:00:00 650 mg via ORAL
  Filled 2021-11-12: qty 2

## 2021-11-12 MED ORDER — BENZTROPINE MESYLATE 1 MG PO TABS
2.0000 mg | ORAL_TABLET | Freq: Two times a day (BID) | ORAL | Status: DC | PRN
  Filled 2021-11-12: qty 2

## 2021-11-12 MED ORDER — METOCLOPRAMIDE HCL 5 MG/ML IJ SOLN
10.0000 mg | Freq: Three times a day (TID) | INTRAMUSCULAR | Status: AC
  Administered 2021-11-12 – 2021-11-13 (×4): 10 mg via INTRAVENOUS
  Filled 2021-11-12 (×4): qty 2

## 2021-11-12 MED ORDER — LACTATED RINGERS IV SOLN: INTRAVENOUS | Status: AC

## 2021-11-12 MED ORDER — FENTANYL CITRATE PF 50 MCG/ML IJ SOSY
50.0000 ug | PREFILLED_SYRINGE | Freq: Once | INTRAMUSCULAR | Status: AC
  Administered 2021-11-12: 50 ug via INTRAVENOUS
  Filled 2021-11-12: qty 1

## 2021-11-12 MED ORDER — LACTATED RINGERS IV BOLUS (SEPSIS)
1000.0000 mL | Freq: Once | INTRAVENOUS | Status: AC
  Administered 2021-11-12: 1000 mL via INTRAVENOUS

## 2021-11-12 MED ORDER — SODIUM CHLORIDE 0.9% FLUSH
3.0000 mL | Freq: Two times a day (BID) | INTRAVENOUS | Status: DC
  Administered 2021-11-12 – 2021-11-14 (×5): 3 mL via INTRAVENOUS

## 2021-11-12 MED ORDER — VANCOMYCIN HCL 2000 MG/400ML IV SOLN
2000.0000 mg | Freq: Once | INTRAVENOUS | Status: AC
Start: 2021-11-12 — End: 2021-11-12
  Administered 2021-11-12: 2000 mg via INTRAVENOUS
  Filled 2021-11-12: qty 400

## 2021-11-12 MED ORDER — SODIUM CHLORIDE 0.9 % IV SOLN
2.0000 g | Freq: Once | INTRAVENOUS | Status: AC
  Administered 2021-11-12: 2 g via INTRAVENOUS
  Filled 2021-11-12: qty 2

## 2021-11-12 MED ORDER — ONDANSETRON HCL 4 MG PO TABS: 4.0000 mg | ORAL_TABLET | Freq: Four times a day (QID) | ORAL | Status: DC | PRN

## 2021-11-12 MED ORDER — ENOXAPARIN SODIUM 40 MG/0.4ML IJ SOSY
40.0000 mg | PREFILLED_SYRINGE | INTRAMUSCULAR | Status: DC
  Administered 2021-11-12 – 2021-11-14 (×3): 40 mg via SUBCUTANEOUS
  Filled 2021-11-12 (×2): qty 0.4

## 2021-11-12 MED ORDER — BUTALBITAL-APAP-CAFFEINE 50-325-40 MG PO TABS
1.0000 | ORAL_TABLET | Freq: Four times a day (QID) | ORAL | Status: DC | PRN
Start: 2021-11-12 — End: 2021-11-14

## 2021-11-12 MED ORDER — ONDANSETRON HCL 4 MG/2ML IJ SOLN
4.0000 mg | Freq: Four times a day (QID) | INTRAMUSCULAR | Status: DC | PRN
  Administered 2021-11-13: 20:00:00 4 mg via INTRAVENOUS
  Filled 2021-11-12: qty 2

## 2021-11-12 MED ORDER — VANCOMYCIN HCL IN DEXTROSE 1-5 GM/200ML-% IV SOLN: 1000.0000 mg | Freq: Once | INTRAVENOUS | Status: DC

## 2021-11-12 MED ORDER — INSULIN GLARGINE-YFGN 100 UNIT/ML ~~LOC~~ SOLN
15.0000 [IU] | Freq: Every day | SUBCUTANEOUS | Status: DC
  Administered 2021-11-12 – 2021-11-14 (×3): 15 [IU] via SUBCUTANEOUS
  Filled 2021-11-12 (×4): qty 0.15

## 2021-11-12 NOTE — ED Notes (Signed)
Pt assisted from toilet with assistance from two staff members. Pt states she feels significantly weak.

## 2021-11-12 NOTE — Sepsis Progress Note (Signed)
Following per sepsis protocol   

## 2021-11-12 NOTE — Sepsis Progress Note (Signed)
Notified bedside nurse of need to draw lactic acid and blood cultures. Requested documentation regarding delay. Pt possibly difficult stick.

## 2021-11-12 NOTE — Progress Notes (Signed)
PHARMACY -  BRIEF ANTIBIOTIC NOTE   Pharmacy has received consult(s) for Vancomycin, Cefepime  from an ED provider.  The patient's profile has been reviewed for ht/wt/allergies/indication/available labs.    One time order(s) placed for Cefepime 2 gm IV X 1 and Vancomycin 2500 mg IV X 1   Further antibiotics/pharmacy consults should be ordered by admitting physician if indicated.                       Thank you, Jeb Schloemer D 11/12/2021  1:52 AM

## 2021-11-12 NOTE — ED Notes (Signed)
Pt alert, NAD, calm, interactive, resps e/u, speaking in clear complete sentences. Denies pain, sob, nausea, HA, or dizziness. States, "feels better". Ate lunch 100%

## 2021-11-12 NOTE — H&P (Addendum)
History and Physical    Katie Gilbert LOV:[id number] DOB: [birth year] DOA: 11/12/2021  PCP: Mechele Claude, FNP   Patient coming from: Home  I have personally briefly reviewed patient's old medical records in [place]  Chief Complaint: "I passed out"  HPI: Katie Gilbert is a 30 y.o. female with medical history significant for diabetes mellitus who presents to the emergency room via EMS for evaluation after she had a witnessed syncopal episode at home. Patient states that she has had intermittent nausea and vomiting for several months associated with abdominal pain and has had multiple ER visits and hospitalization for same.  She recently had an upper endoscopy for further evaluation of her symptoms but does not know the results of findings from the study. For the last several days she has had persistent nausea, vomiting and inability to tolerate any oral intake.  Emesis is usually nonbloody and nonbilious. Her brother was assisting her to the bathroom when he noticed that she suddenly went limp and so he lowered her to the ground.  Patient was said to have lost consciousness transiently.  When EMS arrived she was noted to be hypotensive with systolic blood pressure in the 70s. Abdominal pain is mostly in the epigastrium, nonradiating and is described as a sharp pain.  She denies having any relieving or aggravating factors.  She has had significant weight loss from her symptoms over the last several months. She denies having any fever, no chills, no cough, no palpitations, no diaphoresis, no leg swelling, no headache, no chest pain, shortness of breath, no focal deficits, no blurred vision, no urinary symptoms. Sodium 131, potassium 4.1, chloride 98, bicarb 23 glucose 250, BUN 10, creatinine 1.0, calcium 9.5, alkaline phosphatase 78, albumin four-point, lipase 21, AST 17, ALT 13, total protein 7.7, lactic acid 1.2, procalcitonin 0.51, white count 10.2, hemoglobin 12.4, hematocrit  35.5, MCV 83.9, RDW 12.7, platelet count 335 Respiratory viral panel is negative Urinalysis is sterile CT scan of abdomen and pelvis shows no acute findings. CT scan of the head without contrast shows no acute intracranial process Twelve-lead EKG reviewed by me shows sinus tachycardia.  Low voltage QRS.    ED Course: Patient is a 30 year old female who presents to the ER for evaluation after she had a witnessed syncopal episode at home. She was hypotensive and tachycardic upon arrival to the ER with systolic blood pressure in the 70s and received 1 L fluid bolus with improvement in her blood pressure and resolution of tachycardia. She will be referred to observation status for further evaluation.   Review of Systems: As per HPI otherwise all other systems reviewed and negative.    Past Medical History:  Diagnosis Date   Genital herpes    Type II diabetes mellitus with renal manifestations ([place])     Past Surgical History:  Procedure Laterality Date   FOOT SURGERY Right    I & D EXTREMITY Right 01/17/2019   Procedure: IRRIGATION AND DEBRIDEMENT RIGHT FOOT;  Surgeon: Samara Deist, DPM;  Location: [facility] ORS;  Service: Podiatry;  Laterality: Right;     reports that she has been smoking. She has been smoking an average of .2 packs per day. She has never used smokeless tobacco. She reports that she does not drink alcohol and does not use drugs.  Allergies  Allergen Reactions   Haldol [Haloperidol] Other (See Comments)    Dystonic reaction    Family History  Problem Relation Age of Onset   Hypertension Father  Heart attack Father    Diabetes Father    Diabetes Maternal Grandmother      Prior to Admission medications   Medication Sig Start Date End Date Taking? Authorizing Provider  acetaminophen (TYLENOL) 325 MG tablet Take 2 tablets (650 mg total) by mouth every 6 (six) hours as needed for mild pain (or Fever >/= 101). 01/20/19   Vaughan Basta, MD  benztropine  (COGENTIN) 1 MG tablet Take 2 tablets (2 mg total) by mouth 2 (two) times daily as needed (spasms). 09/26/21 09/26/22  Rudene Re, MD  blood glucose meter kit and supplies KIT Dispense based on patient and insurance preference. Use up to four times daily as directed. (FOR ICD-9 250.00, 250.01). 05/10/21   Wyvonnia Dusky, MD  butalbital-acetaminophen-caffeine [facility]) [phone] MG tablet Take 1-2 tablets by mouth every 6 (six) hours as needed for headache. 05/05/21 05/05/22  Lavonia Drafts, MD  INS SYRINGE/NEEDLE .5CC/27G 27G X 1/2" 0.5 ML MISC Needs enough syringes for lantus daily and aspart/novolog TID x 30 days. Dispense 1 box 05/10/21   Wyvonnia Dusky, MD  insulin aspart (NOVOLOG) 100 UNIT/ML injection Inject 4 Units into the skin 3 (three) times daily with meals. 05/10/21 06/09/21  Wyvonnia Dusky, MD  insulin glargine (LANTUS) 100 UNIT/ML injection Inject 0.15 mLs (15 Units total) into the skin daily. 05/11/21 06/10/21  Wyvonnia Dusky, MD  metFORMIN (GLUCOPHAGE) 500 MG tablet Take 1 tablet (500 mg total) by mouth 2 (two) times daily with a meal. 01/20/19 01/20/20  Vaughan Basta, MD  ondansetron (ZOFRAN ODT) 4 MG disintegrating tablet Allow 1-2 tablets to dissolve in your mouth every 8 hours as needed for nausea/vomiting 06/10/21   Hinda Kehr, MD  vitamin C (VITAMIN C) 500 MG tablet Take 1 tablet (500 mg total) by mouth 2 (two) times daily. Patient not taking: Reported on 05/08/2021 01/20/19   Vaughan Basta, MD    Physical Exam: Vitals:   11/12/21 0335 11/12/21 0606 11/12/21 0630 11/12/21 0700  BP: 118/82 (!) 129/95 128/87 (!) 124/94  Pulse: (!) 101 99 96 97  Resp: 17 20 (!) 22 15  Temp:      TempSrc:      SpO2: 97% 100% 100% 100%  Weight:      Height:         Vitals:   11/12/21 0335 11/12/21 0606 11/12/21 0630 11/12/21 0700  BP: 118/82 (!) 129/95 128/87 (!) 124/94  Pulse: (!) 101 99 96 97  Resp: 17 20 (!) 22 15  Temp:      TempSrc:      SpO2:  97% 100% 100% 100%  Weight:      Height:          Constitutional: Alert and oriented x 3 . Not in any apparent distress HEENT:      Head: Normocephalic and atraumatic.         Eyes: PERLA, EOMI, Conjunctivae are normal. Sclera is non-icteric.       Mouth/Throat: Mucous membranes are moist.       Neck: Supple with no signs of meningismus. Cardiovascular: Tachycardia. No murmurs, gallops, or rubs. 2+ symmetrical distal pulses are present . No JVD. No LE edema Respiratory: Respiratory effort normal .Lungs sounds clear bilaterally. No wheezes, crackles, or rhonchi.  Gastrointestinal: Soft, non tender, and non distended with positive bowel sounds.  Genitourinary: No CVA tenderness. Musculoskeletal: Nontender with normal range of motion in all extremities. No cyanosis, or erythema of extremities. Neurologic:  Face is symmetric. Moving all  extremities. No gross focal neurologic deficits .  Generalized weakness Skin: Skin is warm, dry.  No rash or ulcers Psychiatric: Mood and affect are normal    Labs on Admission: I have personally reviewed following labs and imaging studies  CBC: Recent Labs  Lab 11/11/21 2315  WBC 10.2  HGB 12.4  HCT 35.5*  MCV 83.9  PLT 563   Basic Metabolic Panel: Recent Labs  Lab 11/11/21 2315  NA 131*  K 4.1  CL 98  CO2 23  GLUCOSE 250*  BUN 10  CREATININE 1.00  CALCIUM 9.5   GFR: Estimated Creatinine Clearance: 114.1 mL/min (by C-G formula based on SCr of 1 mg/dL). Liver Function Tests: Recent Labs  Lab 11/11/21 2315  AST 17  ALT 13  ALKPHOS 78  BILITOT 1.2  PROT 7.7  ALBUMIN 4.2   Recent Labs  Lab 11/11/21 2315  LIPASE 21   No results for input(s): AMMONIA in the last 168 hours. Coagulation Profile: No results for input(s): INR, PROTIME in the last 168 hours. Cardiac Enzymes: No results for input(s): CKTOTAL, CKMB, CKMBINDEX, TROPONINI in the last 168 hours. BNP (last 3 results) No results for input(s): PROBNP in the last 8760  hours. HbA1C: No results for input(s): HGBA1C in the last 72 hours. CBG: No results for input(s): GLUCAP in the last 168 hours. Lipid Profile: No results for input(s): CHOL, HDL, LDLCALC, TRIG, CHOLHDL, LDLDIRECT in the last 72 hours. Thyroid Function Tests: No results for input(s): TSH, T4TOTAL, FREET4, T3FREE, THYROIDAB in the last 72 hours. Anemia Panel: No results for input(s): VITAMINB12, FOLATE, FERRITIN, TIBC, IRON, RETICCTPCT in the last 72 hours. Urine analysis:    Component Value Date/Time   COLORURINE YELLOW (A) 11/11/2021 2335   APPEARANCEUR CLEAR 11/11/2021 2335   APPEARANCEUR Cloudy 07/15/2014 1806   LABSPEC 1.025 11/11/2021 2335   LABSPEC 1.039 07/15/2014 1806   PHURINE 6.0 11/11/2021 2335   GLUCOSEU NEGATIVE 11/11/2021 2335   GLUCOSEU >=500 07/15/2014 1806   HGBUR TRACE (A) 11/11/2021 2335   BILIRUBINUR NEGATIVE 11/11/2021 2335   BILIRUBINUR Negative 07/15/2014 1806   KETONESUR 15 (A) 11/11/2021 2335   PROTEINUR 100 (A) 11/11/2021 2335   NITRITE NEGATIVE 11/11/2021 2335   LEUKOCYTESUR NEGATIVE 11/11/2021 2335   LEUKOCYTESUR 3+ 07/15/2014 1806    Radiological Exams on Admission: CT HEAD WO CONTRAST  Result Date: 11/12/2021 CLINICAL DATA:  Dizziness, nonspecific EXAM: CT HEAD WITHOUT CONTRAST TECHNIQUE: Contiguous axial images were obtained from the base of the skull through the vertex without intravenous contrast. COMPARISON:  05/05/2021. FINDINGS: Brain: No evidence of acute infarction, hemorrhage, cerebral edema, mass, mass effect, or midline shift. No hydrocephalus or extra-axial fluid collection. Vascular: No hyperdense vessel. Skull: Normal. Negative for fracture or focal lesion. Sinuses/Orbits: Minimal mucosal thickening in the ethmoid air cells. The orbits are unremarkable. Other: None. IMPRESSION: No acute intracranial process. Electronically Signed   By: Merilyn Baba M.D.   On: 11/12/2021 00:11   CT ABDOMEN PELVIS W CONTRAST  Result Date:  11/12/2021 CLINICAL DATA:  Acute nonlocalized abdominal pain. History of gastro paresis. EXAM: CT ABDOMEN AND PELVIS WITH CONTRAST TECHNIQUE: Multidetector CT imaging of the abdomen and pelvis was performed using the standard protocol following bolus administration of intravenous contrast. CONTRAST:  160m OMNIPAQUE IOHEXOL 300 MG/ML  SOLN COMPARISON:  06/10/2021 FINDINGS: Lower chest:  No contributory findings. Hepatobiliary: No focal liver abnormality.No evidence of biliary obstruction or stone. Pancreas: Unremarkable. Spleen: Unremarkable. Adrenals/Urinary Tract: Negative adrenals. No hydronephrosis or stone. Unremarkable bladder. Stomach/Bowel:  No obstruction. No appendicitis. Vascular/Lymphatic: No acute vascular abnormality. No mass or adenopathy. Reproductive:No pathologic findings. Other: Small volume pelvic fluid, usually physiologic. Corpus luteum on the right. Musculoskeletal: No acute abnormalities. IMPRESSION: No acute finding. Trace pelvic fluid, usually physiologic. Electronically Signed   By: Jorje Guild M.D.   On: 11/12/2021 05:54   DG Chest Portable 1 View  Result Date: 11/12/2021 CLINICAL DATA:  30 year old female with history of possible sepsis. EXAM: PORTABLE CHEST 1 VIEW COMPARISON:  Chest x-ray 01/04/2017. FINDINGS: Lung volumes are normal. No consolidative airspace disease. No pleural effusions. No pneumothorax. No pulmonary nodule or mass noted. Pulmonary vasculature and the cardiomediastinal silhouette are within normal limits. IMPRESSION: No radiographic evidence of acute cardiopulmonary disease. Electronically Signed   By: Vinnie Langton M.D.   On: 11/12/2021 07:01     Assessment/Plan Principal Problem:   Syncope, vasovagal Active Problems:   Diabetes mellitus ([place])   Diabetic gastroparesis ([place])     Patient is a 30 year old female admitted to the hospital after she had a witnessed syncopal episode.    Syncope Most likely vasovagal and related to orthostatic  blood pressure changes Patient had blood pressure in the 70s upon arrival following volume depletion from GI losses( nausea, vomiting and diarrhea) Improved blood pressure following IV fluid resuscitation Orthostatic blood pressure checks Place patient on a cardiac monitor to rule out arrhythmia Obtain 2D echocardiogram     Refractory nausea and vomiting Concern for possible diabetic gastroparesis Will place patient on scheduled Reglan IV Protonix Trial of clears Follow-up with GI as an outpatient    Diabetes mellitus Continuous glycemic control Continue Levemir 15 units daily with sliding scale coverage   DVT prophylaxis: Lovenox  Code Status: full code  Family Communication: Greater than 50% of time was spent discussing patient's condition and plan of care with patient and her brother at the bedside.  All questions and concerns have been addressed.  They verbalized understanding and agree with the plan. Disposition Plan: Back to previous home environment Consults called: none  Status:observation    Dois Juarbe MD [facility]     11/12/2021, 9:11 AM

## 2021-11-12 NOTE — ED Notes (Signed)
Dr Agbata at bedside. 

## 2021-11-12 NOTE — Progress Notes (Signed)
CODE SEPSIS - PHARMACY COMMUNICATION  **Broad Spectrum Antibiotics should be administered within 1 hour of Sepsis diagnosis**  Time Code Sepsis Called/Page Received: 11/26 @ 0132  Antibiotics Ordered: metronidazole , cefepime and vancomycin  Time of 1st antibiotic administration: metronidazole 500 mg IV X 1 @ 0153   Additional action taken by pharmacy:   If necessary, Name of Provider/Nurse Contacted:     Niklas Chretien D ,PharmD Clinical Pharmacist  11/12/2021  2:54 AM

## 2021-11-12 NOTE — ED Notes (Addendum)
Pt ambulatory to the bathroom with standby assistance. Pt is weak and has trouble getting up.

## 2021-11-12 NOTE — ED Notes (Signed)
Brother into room, at [facility].

## 2021-11-12 NOTE — ED Provider Notes (Signed)
[facility] Emergency Department Provider Note  ____________________________________________   Event Date/Time   First MD Initiated Contact with Patient 11/12/21 0107     (approximate)  I have reviewed the triage vital signs and the nursing notes.   HISTORY  Chief Complaint Loss of Consciousness    HPI Katie Gilbert is a 30 y.o. female with history of type 2 diabetes, obesity who is currently being worked up for possible gastroparesis who presents to the emergency department with complaints of 1.5 weeks of nausea and vomiting.  States she will have as many as 10 episodes of nonbloody, nonbilious vomiting a day.  No diarrhea.  No fevers or chills.  States today she stood up and felt lightheaded and passed out.  She reports decreased oral intake and diffuse sharp abdominal pain.  She has been experiencing the symptoms intermittently for several months.  She has recently had an endoscopy and has plans to follow-up with her gastroenterologist next week.  Denies bloody stools or melena.  No abnormal vaginal bleeding or discharge.  No dysuria or hematuria.  No fevers, chills, cough, chest pain or shortness of breath.  Denies previous abdominal surgery.        Past Medical History:  Diagnosis Date   Genital herpes    Type II diabetes mellitus with renal manifestations [facility])     Patient Active Problem List   Diagnosis Date Noted   Pyelonephritis of right kidney 05/08/2021   Migraine headache 05/08/2021   Obesity (BMI 35.0-39.9 without comorbidity) 05/08/2021   Diabetes mellitus without complication ([place])    Acquired absence of other right toe(s) ([place]) 04/12/2021   Diabetic foot ulcer ([place]) 01/16/2019    Past Surgical History:  Procedure Laterality Date   FOOT SURGERY Right    I & D EXTREMITY Right 01/17/2019   Procedure: IRRIGATION AND DEBRIDEMENT RIGHT FOOT;  Surgeon: Samara Deist, DPM;  Location: [facility] ORS;  Service: Podiatry;  Laterality: Right;     Prior to Admission medications   Medication Sig Start Date End Date Taking? Authorizing Provider  acetaminophen (TYLENOL) 325 MG tablet Take 2 tablets (650 mg total) by mouth every 6 (six) hours as needed for mild pain (or Fever >/= 101). 01/20/19   Vaughan Basta, MD  benztropine (COGENTIN) 1 MG tablet Take 2 tablets (2 mg total) by mouth 2 (two) times daily as needed (spasms). 09/26/21 09/26/22  Rudene Re, MD  blood glucose meter kit and supplies KIT Dispense based on patient and insurance preference. Use up to four times daily as directed. (FOR ICD-9 250.00, 250.01). 05/10/21   Wyvonnia Dusky, MD  butalbital-acetaminophen-caffeine [facility]) [phone] MG tablet Take 1-2 tablets by mouth every 6 (six) hours as needed for headache. 05/05/21 05/05/22  Lavonia Drafts, MD  INS SYRINGE/NEEDLE .5CC/27G 27G X 1/2" 0.5 ML MISC Needs enough syringes for lantus daily and aspart/novolog TID x 30 days. Dispense 1 box 05/10/21   Wyvonnia Dusky, MD  insulin aspart (NOVOLOG) 100 UNIT/ML injection Inject 4 Units into the skin 3 (three) times daily with meals. 05/10/21 06/09/21  Wyvonnia Dusky, MD  insulin glargine (LANTUS) 100 UNIT/ML injection Inject 0.15 mLs (15 Units total) into the skin daily. 05/11/21 06/10/21  Wyvonnia Dusky, MD  metFORMIN (GLUCOPHAGE) 500 MG tablet Take 1 tablet (500 mg total) by mouth 2 (two) times daily with a meal. 01/20/19 01/20/20  Vaughan Basta, MD  ondansetron (ZOFRAN ODT) 4 MG disintegrating tablet Allow 1-2 tablets to dissolve in your mouth every  8 hours as needed for nausea/vomiting 06/10/21   Hinda Kehr, MD  vitamin C (VITAMIN C) 500 MG tablet Take 1 tablet (500 mg total) by mouth 2 (two) times daily. Patient not taking: Reported on 05/08/2021 01/20/19   Vaughan Basta, MD    Allergies Haldol [haloperidol]  Family History  Problem Relation Age of Onset   Hypertension Father    Heart attack Father    Diabetes Father    Diabetes  Maternal Grandmother     Social History Social History   Tobacco Use   Smoking status: Light Smoker    Packs/day: 0.20    Types: Cigarettes   Smokeless tobacco: Never  Vaping Use   Vaping Use: Never used  Substance Use Topics   Alcohol use: No   Drug use: No    Review of Systems Constitutional: No fever. Eyes: No visual changes. ENT: No sore throat. Cardiovascular: Denies chest pain. Respiratory: Denies shortness of breath. Gastrointestinal: No nausea, vomiting, diarrhea. Genitourinary: Negative for dysuria. Musculoskeletal: Negative for back pain. Skin: Negative for rash. Neurological: Negative for focal weakness or numbness.  ____________________________________________   PHYSICAL EXAM:  VITAL SIGNS: ED Triage Vitals  Enc Vitals Group     BP 11/11/21 2246 (!) 77/56     Pulse Rate 11/11/21 2246 (!) 128     Resp 11/11/21 2246 18     Temp 11/11/21 2246 99.1 F (37.3 C)     Temp Source 11/11/21 2246 Oral     SpO2 11/11/21 2246 99 %     Weight 11/11/21 2254 220 lb (99.8 kg)     Height 11/11/21 2254 '6\' 3"'  (1.905 m)     Head Circumference --      Peak Flow --      Pain Score 11/11/21 2310 0     Pain Loc --      Pain Edu? --      Excl. in [place]? --    CONSTITUTIONAL: Alert and oriented and responds appropriately to questions. Well-appearing; well-nourished HEAD: Normocephalic, atraumatic EYES: Conjunctivae clear, pupils appear equal, EOM appear intact ENT: normal nose; moist mucous membranes NECK: Supple, normal ROM, no midline spinal tenderness step-off or deformity CARD: RRR; S1 and S2 appreciated; no murmurs, no clicks, no rubs, no gallops RESP: Normal chest excursion without splinting or tachypnea; breath sounds clear and equal bilaterally; no wheezes, no rhonchi, no rales, no hypoxia or respiratory distress, speaking full sentences ABD/GI: Normal bowel sounds; non-distended; soft, diffusely tender to palpation, no rebound, no guarding, no peritoneal signs, no  hepatosplenomegaly BACK: The back appears normal no midline spinal tenderness step-off or deformity EXT: Normal ROM in all joints; no deformity noted, no edema; no cyanosis, no calf tenderness or calf swelling SKIN: Normal color for age and race; warm; no rash on exposed skin NEURO: Moves all extremities equally, normal speech, no facial asymmetry, normal gait PSYCH: The patient's mood and manner are appropriate.  ____________________________________________   LABS (all labs ordered are listed, but only abnormal results are displayed)  Labs Reviewed  BASIC METABOLIC PANEL - Abnormal; Notable for the following components:      Result Value   Sodium 131 (*)    Glucose, Bld 250 (*)    All other components within normal limits  CBC - Abnormal; Notable for the following components:   HCT 35.5 (*)    All other components within normal limits  URINALYSIS, ROUTINE W REFLEX MICROSCOPIC - Abnormal; Notable for the following components:   Color, Urine YELLOW (*)  Hgb urine dipstick TRACE (*)    Ketones, ur 15 (*)    Protein, ur 100 (*)    All other components within normal limits  HEPATIC FUNCTION PANEL - Abnormal; Notable for the following components:   Indirect Bilirubin 1.0 (*)    All other components within normal limits  RESP PANEL BY RT-PCR (FLU A&B, COVID) ARPGX2  CULTURE, BLOOD (ROUTINE X 2)  CULTURE, BLOOD (ROUTINE X 2)  URINE CULTURE  LACTIC ACID, PLASMA  PROCALCITONIN  LIPASE, BLOOD  PREGNANCY, URINE  URINALYSIS, MICROSCOPIC (REFLEX)  LACTIC ACID, PLASMA   ____________________________________________  EKG   EKG Interpretation  Date/Time:  Friday November 11 2021 22:53:45 EST Ventricular Rate:  122 PR Interval:  152 QRS Duration: 64 QT Interval:  306 QTC Calculation: 436 R Axis:   60 Text Interpretation: Sinus tachycardia Septal infarct , age undetermined Abnormal ECG Confirmed by Pryor Curia [phone]) on 11/12/2021 2:55:31 AM             ____________________________________________  RADIOLOGY Jessie Foot Lidia Clavijo, personally viewed and evaluated these images (plain radiographs) as part of my medical decision making, as well as reviewing the written report by the radiologist.  ED MD interpretation: CT head shows no acute abnormality.  CT abdomen pelvis shows no acute finding.  Official radiology report(s): CT HEAD WO CONTRAST  Result Date: 11/12/2021 CLINICAL DATA:  Dizziness, nonspecific EXAM: CT HEAD WITHOUT CONTRAST TECHNIQUE: Contiguous axial images were obtained from the base of the skull through the vertex without intravenous contrast. COMPARISON:  05/05/2021. FINDINGS: Brain: No evidence of acute infarction, hemorrhage, cerebral edema, mass, mass effect, or midline shift. No hydrocephalus or extra-axial fluid collection. Vascular: No hyperdense vessel. Skull: Normal. Negative for fracture or focal lesion. Sinuses/Orbits: Minimal mucosal thickening in the ethmoid air cells. The orbits are unremarkable. Other: None. IMPRESSION: No acute intracranial process. Electronically Signed   By: Merilyn Baba M.D.   On: 11/12/2021 00:11   CT ABDOMEN PELVIS W CONTRAST  Result Date: 11/12/2021 CLINICAL DATA:  Acute nonlocalized abdominal pain. History of gastro paresis. EXAM: CT ABDOMEN AND PELVIS WITH CONTRAST TECHNIQUE: Multidetector CT imaging of the abdomen and pelvis was performed using the standard protocol following bolus administration of intravenous contrast. CONTRAST:  122m OMNIPAQUE IOHEXOL 300 MG/ML  SOLN COMPARISON:  06/10/2021 FINDINGS: Lower chest:  No contributory findings. Hepatobiliary: No focal liver abnormality.No evidence of biliary obstruction or stone. Pancreas: Unremarkable. Spleen: Unremarkable. Adrenals/Urinary Tract: Negative adrenals. No hydronephrosis or stone. Unremarkable bladder. Stomach/Bowel:  No obstruction. No appendicitis. Vascular/Lymphatic: No acute vascular abnormality. No mass or adenopathy.  Reproductive:No pathologic findings. Other: Small volume pelvic fluid, usually physiologic. Corpus luteum on the right. Musculoskeletal: No acute abnormalities. IMPRESSION: No acute finding. Trace pelvic fluid, usually physiologic. Electronically Signed   By: JJorje GuildM.D.   On: 11/12/2021 05:54    ____________________________________________   PROCEDURES  Procedure(s) performed (including Critical Care):  .1-3 Lead EKG Interpretation Performed by: Janayla Marik, KDelice Bison DO Authorized by: Unnamed Zeien, KDelice Bison DO     Interpretation: normal     ECG rate:  100   ECG rate assessment: tachycardic     Rhythm: sinus tachycardia     Ectopy: none     Conduction: normal    CRITICAL CARE Performed by: KPryor Curia  Total critical care time: 50 minutes  Critical care time was exclusive of separately billable procedures and treating other patients.  Critical care was necessary to treat or prevent imminent or life-threatening deterioration.  Critical care was  time spent personally by me on the following activities: development of treatment plan with patient and/or surrogate as well as nursing, discussions with consultants, evaluation of patient's response to treatment, examination of patient, obtaining history from patient or surrogate, ordering and performing treatments and interventions, ordering and review of laboratory studies, ordering and review of radiographic studies, pulse oximetry and re-evaluation of patient's condition.  ____________________________________________   INITIAL IMPRESSION / ASSESSMENT AND PLAN / ED COURSE  As part of my medical decision making, I reviewed the following data within the [place] History obtained from family, Nursing notes reviewed and incorporated, Labs reviewed , EKG interpreted , Old EKG reviewed, Old chart reviewed, Radiograph reviewed , Discussed with admitting physician , CT reviewed, and Notes from prior ED visits          Patient here with nausea, vomiting and abdominal pain.  States she is being worked up currently for gastroparesis related to her diabetes.  She did have a syncopal episode today and was found to be hypotensive and tachycardic.  Blood pressure and heart rate have improved significantly after a liter of fluids.  Symptoms may all be secondary to dehydration.  Her rectal temperature is normal today.  She denies any other infectious symptoms.  Differential includes dehydration and orthostasis from gastroparesis, colitis, diverticulitis, appendicitis, UTI, pyelonephritis, kidney stone, cholecystitis, pancreatitis, gastritis.  Will obtain CT of the abdomen pelvis for further evaluation.  Will check lactic and procalcitonin also to ensure this is not sepsis.  We will continue to hydrate aggressively.  Patient denied any chest pain, shortness of breath, palpitations.  Doubt ACS, PE or dissection as a cause of her syncopal event.  Her EKG is nonischemic but does show sinus tachycardia.  We will continue to closely monitor on cardiac monitoring.  ED PROGRESS  2:55 AM  Patient's labs show no leukocytosis or leukopenia.  Hemoglobin is normal.  Urine shows small amount of ketones but no sign of infection.  Electrolytes within normal limits.  LFTs and lipase normal.  CT head obtained from triage unremarkable.  Procalcitonin is elevated.  Patient is receiving 30 mL/kg IV fluid bolus as well as broad-spectrum antibiotics.  Lactic pending.  CT of the abdomen pelvis pending.  6:25 AM  Pt's CT scan shows no acute abnormality.  Blood pressures continue to improve but would like to observe patient given significant hypotension with elevated procalcitonin to make sure that her cultures come back negative.  Patient comfortable with this plan.  We will add on a chest x-ray as well given no obvious source of bacterial infection on her work-up thus far.  Will discuss with hospitalist.   6:30 AM Discussed patient's case with  hospitalist, Dr. Damita Dunnings.  I have recommended admission and patient (and family if present) agree with this plan. Admitting physician will place admission orders.   I reviewed all nursing notes, vitals, pertinent previous records and reviewed/interpreted all EKGs, lab and urine results, imaging (as available).  ____________________________________________   FINAL CLINICAL IMPRESSION(S) / ED DIAGNOSES  Final diagnoses:  Nausea and vomiting in adult  Hypotension, unspecified hypotension type  Syncope and collapse     ED Discharge Orders     None       *Please note:  LIGIA DUGUAY was evaluated in Emergency Department on 11/12/2021 for the symptoms described in the history of present illness. She was evaluated in the context of the global COVID-19 pandemic, which necessitated consideration that the patient might be at risk for  infection with the SARS-CoV-2 virus that causes COVID-19. Institutional protocols and algorithms that pertain to the evaluation of patients at risk for COVID-19 are in a state of rapid change based on information released by regulatory bodies including the CDC and federal and state organizations. These policies and algorithms were followed during the patient's care in the ED.  Some ED evaluations and interventions may be delayed as a result of limited staffing during and the pandemic.*   Note:  This document was prepared using Dragon voice recognition software and may include unintentional dictation errors.    Steen Bisig, Delice Bison, DO 11/12/21 0630

## 2021-11-13 ENCOUNTER — Observation Stay (HOSPITAL_BASED_OUTPATIENT_CLINIC_OR_DEPARTMENT_OTHER)
Admit: 2021-11-13 | Discharge: 2021-11-13 | Disposition: A | Discharge status: Home / Self Care | Payer: Managed Care, Other (non HMO) | Attending: Internal Medicine | Admitting: Internal Medicine

## 2021-11-13 ENCOUNTER — Observation Stay: Payer: Managed Care, Other (non HMO)

## 2021-11-13 DIAGNOSIS — R4182 Altered mental status, unspecified: Secondary | ICD-10-CM | POA: Diagnosis not present

## 2021-11-13 DIAGNOSIS — R55 Syncope and collapse: Secondary | ICD-10-CM | POA: Diagnosis not present

## 2021-11-13 DIAGNOSIS — R531 Weakness: Secondary | ICD-10-CM | POA: Diagnosis not present

## 2021-11-13 DIAGNOSIS — R000 Tachycardia, unspecified: Secondary | ICD-10-CM | POA: Diagnosis not present

## 2021-11-13 DIAGNOSIS — E1169 Type 2 diabetes mellitus with other specified complication: Secondary | ICD-10-CM | POA: Diagnosis not present

## 2021-11-13 LAB — HEPATIC FUNCTION PANEL
ALT: 13 U/L (ref 0–44)
AST: 15 U/L (ref 15–41)
Albumin: 3.3 g/dL — ABNORMAL LOW (ref 3.5–5.0)
Alkaline Phosphatase: 59 U/L (ref 38–126)
Bilirubin, Direct: <0.1 mg/dL (ref 0.0–0.2)
Total Bilirubin: 0.5 mg/dL (ref 0.3–1.2)
Total Protein: 6.2 g/dL — ABNORMAL LOW (ref 6.5–8.1)

## 2021-11-13 LAB — CBC
HCT: 28.5 % — ABNORMAL LOW (ref 36.0–46.0)
Hemoglobin: 9.7 g/dL — ABNORMAL LOW (ref 12.0–15.0)
MCH: 28.9 pg (ref 26.0–34.0)
MCHC: 34 g/dL (ref 30.0–36.0)
MCV: 84.8 fL (ref 80.0–100.0)
Platelets: 217 10*3/uL (ref 150–400)
RBC: 3.36 MIL/uL — ABNORMAL LOW (ref 3.87–5.11)
RDW: 12.3 % (ref 11.5–15.5)
WBC: 7.1 10*3/uL (ref 4.0–10.5)
nRBC: 0 % (ref 0.0–0.2)

## 2021-11-13 LAB — ECHOCARDIOGRAM COMPLETE
AR max vel: 2.58 cm2
AV Area VTI: 3.23 cm2
AV Area mean vel: 2.54 cm2
AV Mean grad: 5 mmHg
AV Peak grad: 8.9 mmHg
Ao pk vel: 1.49 m/s
Area-P 1/2: 9.6 cm2
MV VTI: 3.05 cm2
S' Lateral: 2.7 cm
Weight: 3724.89 oz

## 2021-11-13 LAB — BASIC METABOLIC PANEL
Anion gap: 6 (ref 5–15)
BUN: 11 mg/dL (ref 6–20)
CO2: 24 mmol/L (ref 22–32)
Calcium: 8.6 mg/dL — ABNORMAL LOW (ref 8.9–10.3)
Chloride: 105 mmol/L (ref 98–111)
Creatinine, Ser: 0.65 mg/dL (ref 0.44–1.00)
GFR, Estimated: >60 mL/min (ref >60)
Glucose, Bld: 204 mg/dL — ABNORMAL HIGH (ref 70–99)
Potassium: 3.6 mmol/L (ref 3.5–5.1)
Sodium: 135 mmol/L (ref 135–145)

## 2021-11-13 LAB — GLUCOSE, CAPILLARY
Glucose-Capillary: 191 mg/dL — ABNORMAL HIGH (ref 70–99)
Glucose-Capillary: 210 mg/dL — ABNORMAL HIGH (ref 70–99)
Glucose-Capillary: 133 mg/dL — ABNORMAL HIGH (ref 70–99)
Glucose-Capillary: 111 mg/dL — ABNORMAL HIGH (ref 70–99)
Glucose-Capillary: 139 mg/dL — ABNORMAL HIGH (ref 70–99)

## 2021-11-13 LAB — URINE DRUG SCREEN, QUALITATIVE (ARMC ONLY)
Amphetamines, Ur Screen: NOT DETECTED
Barbiturates, Ur Screen: NOT DETECTED
Benzodiazepine, Ur Scrn: NOT DETECTED
Cannabinoid 50 Ng, Ur ~~LOC~~: NOT DETECTED
Cocaine Metabolite,Ur ~~LOC~~: NOT DETECTED
MDMA (Ecstasy)Ur Screen: NOT DETECTED
Methadone Scn, Ur: NOT DETECTED
Opiate, Ur Screen: NOT DETECTED
Phencyclidine (PCP) Ur S: NOT DETECTED
Tricyclic, Ur Screen: NOT DETECTED

## 2021-11-13 LAB — URINE CULTURE

## 2021-11-13 LAB — LIPASE, BLOOD: Lipase: 27 U/L (ref 11–51)

## 2021-11-13 MED ORDER — MORPHINE SULFATE (PF) 2 MG/ML IV SOLN
2.0000 mg | Freq: Once | INTRAVENOUS | Status: AC
  Administered 2021-11-13: 22:00:00 2 mg via INTRAVENOUS
  Filled 2021-11-13: qty 1

## 2021-11-13 MED ORDER — MORPHINE SULFATE (PF) 2 MG/ML IV SOLN
1.0000 mg | INTRAVENOUS | Status: DC | PRN
  Administered 2021-11-13: 19:00:00 1 mg via INTRAVENOUS
  Filled 2021-11-13: qty 1

## 2021-11-13 MED ORDER — HYDRALAZINE HCL 20 MG/ML IJ SOLN
20.0000 mg | Freq: Four times a day (QID) | INTRAMUSCULAR | Status: DC | PRN
  Administered 2021-11-13: 19:00:00 20 mg via INTRAVENOUS
  Filled 2021-11-13: qty 1

## 2021-11-13 NOTE — Plan of Care (Signed)
  Problem: Education: Goal: Knowledge of General Education information will improve Description: Including pain rating scale, medication(s)/side effects and non-pharmacologic comfort measures 11/13/2021 0146 by Cottie Banda, Mardelle Matte, RN Outcome: Not Progressing 11/13/2021 0146 by Cottie Banda, Mardelle Matte, RN Outcome: Not Progressing   Problem: Clinical Measurements: Goal: Will remain free from infection 11/13/2021 0146 by Cottie Banda, Mardelle Matte, RN Outcome: Not Progressing 11/13/2021 0146 by Cottie Banda, Mardelle Matte, RN Outcome: Not Progressing   Problem: Clinical Measurements: Goal: Respiratory complications will improve 11/13/2021 0146 by Maryjean Morn, RN Outcome: Not Progressing 11/13/2021 0146 by Cottie Banda, Mardelle Matte, RN Outcome: Not Progressing   Problem: Clinical Measurements: Goal: Respiratory complications will improve 11/13/2021 0146 by Maryjean Morn, RN Outcome: Not Progressing 11/13/2021 0146 by Cottie Banda, Mardelle Matte, RN Outcome: Not Progressing   Problem: Activity: Goal: Risk for activity intolerance will decrease 11/13/2021 0146 by Cottie Banda, Mardelle Matte, RN Outcome: Not Progressing 11/13/2021 0146 by Cottie Banda, Mardelle Matte, RN Outcome: Not Progressing   Problem: Pain Managment: Goal: General experience of comfort will improve 11/13/2021 0146 by Cottie Banda, Mardelle Matte, RN Outcome: Not Progressing 11/13/2021 0146 by Cottie Banda, Mardelle Matte, RN Outcome: Not Progressing

## 2021-11-13 NOTE — Progress Notes (Signed)
*  PRELIMINARY RESULTS* Echocardiogram 2D Echocardiogram has been performed.  Katie Gilbert Hiro Vipond 11/13/2021, 10:33 AM

## 2021-11-13 NOTE — Progress Notes (Addendum)
PROGRESS NOTE    Katie Gilbert  VZC:[id number] DOB: June 06, 1991 DOA: 11/12/2021 PCP: Miki Kins, FNP   Assessment & Plan:   Principal Problem:   Syncope, vasovagal Active Problems:   Diabetes mellitus (HCC)   Diabetic gastroparesis (HCC)   Syncope: etiology likely vasovagal & related to orthostatic hypotension. BP was in 70s upon arrival as pt had volume loss from nausea, vomiting, & diarrhea.  Echo is pending   Generalized weakness: PT consulted  Refractory nausea and vomiting: etiology unclear, possibly secondary to diabetic gastroparesis. Continue on reglan.    DM2: likely poorly controlled. HbA1c is pending. Continue on glargine, SSI w/ accuchecks    DVT prophylaxis: lovenox  Code Status: full  Family Communication:  Disposition Plan: likely d/c back home   Level of care: Med-Surg  Status is: Observation      Consultants:    Procedures:   Antimicrobials:   Subjective: Pt c/o weakness   Objective: Vitals:   11/12/21 2352 11/13/21 0500 11/13/21 0503 11/13/21 0746  BP: (!) 137/97  (!) 147/95 121/86  Pulse: (!) 102  99 (!) 102  Resp: 18  18 18   Temp: 98.4 F (36.9 C)  98.3 F (36.8 C) 98.4 F (36.9 C)  TempSrc: Oral  Oral Oral  SpO2: 100%  100% 99%  Weight:  105.6 kg    Height:        Intake/Output Summary (Last 24 hours) at 11/13/2021 11/15/2021 Last data filed at 11/12/2021 2129 Gross per 24 hour  Intake 2913.5 ml  Output --  Net 2913.5 ml   Filed Weights   11/11/21 2254 11/13/21 0500  Weight: 99.8 kg 105.6 kg    Examination:  General exam: Appears calm and comfortable  Respiratory system: Clear to auscultation. Respiratory effort normal. Cardiovascular system: S1 & S2+. No rubs, gallops or clicks.  Gastrointestinal system: Abdomen is nondistended, soft and nontender. Normal bowel sounds heard. Central nervous system: Alert and oriented. Moves all extremities  Psychiatry: Judgement and insight appear normal. Flat mood and  affect    Data Reviewed: I have personally reviewed following labs and imaging studies  CBC: Recent Labs  Lab 11/11/21 2315 11/13/21 0538  WBC 10.2 7.1  HGB 12.4 9.7*  HCT 35.5* 28.5*  MCV 83.9 84.8  PLT 335 217   Basic Metabolic Panel: Recent Labs  Lab 11/11/21 2315 11/13/21 0538  NA 131* 135  K 4.1 3.6  CL 98 105  CO2 23 24  GLUCOSE 250* 204*  BUN 10 11  CREATININE 1.00 0.65  CALCIUM 9.5 8.6*   GFR: Estimated Creatinine Clearance: 146.4 mL/min (by C-G formula based on SCr of 0.65 mg/dL). Liver Function Tests: Recent Labs  Lab 11/11/21 2315  AST 17  ALT 13  ALKPHOS 78  BILITOT 1.2  PROT 7.7  ALBUMIN 4.2   Recent Labs  Lab 11/11/21 2315  LIPASE 21   No results for input(s): AMMONIA in the last 168 hours. Coagulation Profile: No results for input(s): INR, PROTIME in the last 168 hours. Cardiac Enzymes: No results for input(s): CKTOTAL, CKMB, CKMBINDEX, TROPONINI in the last 168 hours. BNP (last 3 results) No results for input(s): PROBNP in the last 8760 hours. HbA1C: No results for input(s): HGBA1C in the last 72 hours. CBG: Recent Labs  Lab 11/12/21 1132 11/12/21 1717 11/12/21 2003 11/13/21 0502 11/13/21 0748  GLUCAP 175* 178* 95 191* 210*   Lipid Profile: No results for input(s): CHOL, HDL, LDLCALC, TRIG, CHOLHDL, LDLDIRECT in the last 72 hours. Thyroid  Function Tests: No results for input(s): TSH, T4TOTAL, FREET4, T3FREE, THYROIDAB in the last 72 hours. Anemia Panel: No results for input(s): VITAMINB12, FOLATE, FERRITIN, TIBC, IRON, RETICCTPCT in the last 72 hours. Sepsis Labs: Recent Labs  Lab 11/11/21 2315 11/12/21 0331 11/12/21 0556  PROCALCITON 0.51  --   --   LATICACIDVEN  --  1.2 1.2    Recent Results (from the past 240 hour(s))  Resp Panel by RT-PCR (Flu A&B, Covid) Nasopharyngeal Swab     Status: None   Collection Time: 11/12/21  3:31 AM   Specimen: Nasopharyngeal Swab; Nasopharyngeal(NP) swabs in vial transport medium   Result Value Ref Range Status   SARS Coronavirus 2 by RT PCR NEGATIVE NEGATIVE Final    Comment: (NOTE) SARS-CoV-2 target nucleic acids are NOT DETECTED.  The SARS-CoV-2 RNA is generally detectable in upper respiratory specimens during the acute phase of infection. The lowest concentration of SARS-CoV-2 viral copies this assay can detect is 138 copies/mL. A negative result does not preclude SARS-Cov-2 infection and should not be used as the sole basis for treatment or other patient management decisions. A negative result may occur with  improper specimen collection/handling, submission of specimen other than nasopharyngeal swab, presence of viral mutation(s) within the areas targeted by this assay, and inadequate number of viral copies(<138 copies/mL). A negative result must be combined with clinical observations, patient history, and epidemiological information. The expected result is Negative.  Fact Sheet for Patients:  BloggerCourse.com  Fact Sheet for Healthcare Providers:  SeriousBroker.it  This test is no t yet approved or cleared by the [place] FDA and  has been authorized for detection and/or diagnosis of SARS-CoV-2 by FDA under an Emergency Use Authorization (EUA). This EUA will remain  in effect (meaning this test can be used) for the duration of the COVID-19 declaration under Section 564(b)(1) of the Act, 21 U.S.C.section 360bbb-3(b)(1), unless the authorization is terminated  or revoked sooner.       Influenza A by PCR NEGATIVE NEGATIVE Final   Influenza B by PCR NEGATIVE NEGATIVE Final    Comment: (NOTE) The Xpert Xpress SARS-CoV-2/FLU/RSV plus assay is intended as an aid in the diagnosis of influenza from Nasopharyngeal swab specimens and should not be used as a sole basis for treatment. Nasal washings and aspirates are unacceptable for Xpert Xpress SARS-CoV-2/FLU/RSV testing.  Fact Sheet for  Patients: BloggerCourse.com  Fact Sheet for Healthcare Providers: SeriousBroker.it  This test is not yet approved or cleared by the [place] FDA and has been authorized for detection and/or diagnosis of SARS-CoV-2 by FDA under an Emergency Use Authorization (EUA). This EUA will remain in effect (meaning this test can be used) for the duration of the COVID-19 declaration under Section 564(b)(1) of the Act, 21 U.S.C. section 360bbb-3(b)(1), unless the authorization is terminated or revoked.  Performed at [facility], [address] [place], [place] [address]   Blood Culture (routine x 2)     Status: None (Preliminary result)   Collection Time: 11/12/21  3:31 AM   Specimen: BLOOD  Result Value Ref Range Status   Specimen Description BLOOD RAC  Final   Special Requests   Final    BOTTLES DRAWN AEROBIC AND ANAEROBIC Blood Culture results may not be optimal due to an excessive volume of blood received in culture bottles   Culture   Final    NO GROWTH 1 DAY Performed at [facility], [address], [place], [place] [address]    Report Status  PENDING  Incomplete  Blood Culture (routine x 2)     Status: None (Preliminary result)   Collection Time: 11/12/21  3:49 PM   Specimen: BLOOD  Result Value Ref Range Status   Specimen Description BLOOD BLOOD RIGHT HAND  Final   Special Requests   Final    BOTTLES DRAWN AEROBIC AND ANAEROBIC Blood Culture adequate volume   Culture   Final    NO GROWTH < 24 HOURS Performed at [facility], [address] [place], [place] [address]    Report Status PENDING  Incomplete         Radiology Studies: CT HEAD WO CONTRAST  Result Date: 11/12/2021 CLINICAL DATA:  Dizziness, nonspecific EXAM: CT HEAD WITHOUT CONTRAST TECHNIQUE: Contiguous axial images were obtained from the base of the skull through the vertex without intravenous contrast. COMPARISON:   05/05/2021. FINDINGS: Brain: No evidence of acute infarction, hemorrhage, cerebral edema, mass, mass effect, or midline shift. No hydrocephalus or extra-axial fluid collection. Vascular: No hyperdense vessel. Skull: Normal. Negative for fracture or focal lesion. Sinuses/Orbits: Minimal mucosal thickening in the ethmoid air cells. The orbits are unremarkable. Other: None. IMPRESSION: No acute intracranial process. Electronically Signed   By: Wiliam Ke M.D.   On: 11/12/2021 00:11   CT ABDOMEN PELVIS W CONTRAST  Result Date: 11/12/2021 CLINICAL DATA:  Acute nonlocalized abdominal pain. History of gastro paresis. EXAM: CT ABDOMEN AND PELVIS WITH CONTRAST TECHNIQUE: Multidetector CT imaging of the abdomen and pelvis was performed using the standard protocol following bolus administration of intravenous contrast. CONTRAST:  OMNIPAQUE IOHEXOL 300 MG/ML  SOLN COMPARISON:  06/10/2021 FINDINGS: Lower chest:  No contributory findings. Hepatobiliary: No focal liver abnormality.No evidence of biliary obstruction or stone. Pancreas: Unremarkable. Spleen: Unremarkable. Adrenals/Urinary Tract: Negative adrenals. No hydronephrosis or stone. Unremarkable bladder. Stomach/Bowel:  No obstruction. No appendicitis. Vascular/Lymphatic: No acute vascular abnormality. No mass or adenopathy. Reproductive:No pathologic findings. Other: Small volume pelvic fluid, usually physiologic. Corpus luteum on the right. Musculoskeletal: No acute abnormalities. IMPRESSION: No acute finding. Trace pelvic fluid, usually physiologic. Electronically Signed   By: Tiburcio Pea M.D.   On: 11/12/2021 05:54   DG Chest Portable 1 View  Result Date: 11/12/2021 CLINICAL DATA:  30 year old female with history of possible sepsis. EXAM: PORTABLE CHEST 1 VIEW COMPARISON:  Chest x-ray 01/04/2017. FINDINGS: Lung volumes are normal. No consolidative airspace disease. No pleural effusions. No pneumothorax. No pulmonary nodule or mass noted. Pulmonary  vasculature and the cardiomediastinal silhouette are within normal limits. IMPRESSION: No radiographic evidence of acute cardiopulmonary disease. Electronically Signed   By: Trudie Reed M.D.   On: 11/12/2021 07:01        Scheduled Meds:  enoxaparin (LOVENOX) injection  40 mg Subcutaneous Q24H   insulin aspart  0-15 Units Subcutaneous TID WC   insulin glargine-yfgn  15 Units Subcutaneous Daily   pantoprazole (PROTONIX) IV  40 mg Intravenous Q24H   sodium chloride flush  3 mL Intravenous Q12H   Continuous Infusions:   LOS: 0 days    Time spent: 30 mins    Charise Killian, MD [facility] Pager [phone]  If 7PM-7AM, please contact night-coverage 11/13/2021, 8:12 AM

## 2021-11-13 NOTE — Progress Notes (Signed)
   11/13/21 2011  Assess: MEWS Score  Temp 98.2 F (36.8 C)  BP 119/81  Pulse Rate (!) 116  Resp 18  SpO2 100 %  O2 Device Room Air  Assess: MEWS Score  MEWS Temp 0  MEWS Systolic 0  MEWS Pulse 2  MEWS RR 0  MEWS LOC 0  MEWS Score 2  MEWS Score Color Yellow  Assess: if the MEWS score is Yellow or Red  Were vital signs taken at a resting state? Yes  Focused Assessment Change from prior assessment (see assessment flowsheet)  Does the patient meet 2 or more of the SIRS criteria? No  MEWS guidelines implemented *See Row Information* Yes  Treat  Pain Scale 0-10  Pain Score 9  Pain Type Chronic pain  Pain Location Abdomen  Pain Orientation Upper;Mid  Pain Descriptors / Indicators Cramping  Pain Frequency Intermittent  Pain Onset On-going  Patients Stated Pain Goal 2  Pain Intervention(s) MD notified (Comment);Repositioned  Take Vital Signs  Increase Vital Sign Frequency  Yellow: Q 2hr X 2 then Q 4hr X 2, if remains yellow, continue Q 4hrs  Escalate  MEWS: Escalate Yellow: discuss with charge nurse/RN and consider discussing with provider and RRT  Notify: Charge Nurse/RN  Name of Charge Nurse/RN Notified T. Conyers  Date Charge Nurse/RN Notified 11/13/21  Notify: Provider  Provider Name/Title Nechama Guard  Date Provider Notified 11/13/21  Time Provider Notified 2011  Notification Type Page  Notification Reason  (MEWS 2, HR 115-120)  Provider response Evaluate remotely  Date of Provider Response 11/13/21  Time of Provider Response 2016  Assess: SIRS CRITERIA  SIRS Temperature  0  SIRS Pulse 1  SIRS Respirations  0  SIRS WBC 0  SIRS Score Sum  1

## 2021-11-13 NOTE — Progress Notes (Signed)
HOSPITAL MEDICINE OVERNIGHT EVENT NOTE    Notified by nursing that patient is exhibiting a yellow mews score.  Chart reviewed, patient is currently being treated for diabetic gastroparesis with intractable nausea and vomiting as well as episodic syncope and hypotension which has resolved.    Clinically, nursing reports the patient has been complaining of substantial epigastric abdominal pain since the mid evening.  Daytime provider was notified of this abdominal pain and ordered an abdominal x-ray which is unremarkable.    Vital signs reviewed.  Patient exhibits tachycardia which may be secondary to pain but otherwise vital signs are stable.  Nursing reports that patient is currently not hypoglycemic and is currently afebrile.  X-ray performed earlier this evening is unremarkable.  Obtaining repeat CMP and lipase and EKG.  If work-up negative, patient's pain may simply be related to known history of gastroparesis.  We will go ahead and provide a one-time dose of intravenous morphine.  Any further dosing of opiate-based analgesics needs to be discussed with a provider.   Marinda Elk  MD [facility]

## 2021-11-14 DIAGNOSIS — E1169 Type 2 diabetes mellitus with other specified complication: Secondary | ICD-10-CM | POA: Diagnosis not present

## 2021-11-14 DIAGNOSIS — R55 Syncope and collapse: Secondary | ICD-10-CM | POA: Diagnosis not present

## 2021-11-14 DIAGNOSIS — R531 Weakness: Secondary | ICD-10-CM | POA: Diagnosis not present

## 2021-11-14 LAB — GLUCOSE, CAPILLARY
Glucose-Capillary: 159 mg/dL — ABNORMAL HIGH (ref 70–99)
Glucose-Capillary: 156 mg/dL — ABNORMAL HIGH (ref 70–99)

## 2021-11-14 LAB — CBC
HCT: 26.9 % — ABNORMAL LOW (ref 36.0–46.0)
Hemoglobin: 9.3 g/dL — ABNORMAL LOW (ref 12.0–15.0)
MCH: 29.7 pg (ref 26.0–34.0)
MCHC: 34.6 g/dL (ref 30.0–36.0)
MCV: 85.9 fL (ref 80.0–100.0)
Platelets: 222 10*3/uL (ref 150–400)
RBC: 3.13 MIL/uL — ABNORMAL LOW (ref 3.87–5.11)
RDW: 12.2 % (ref 11.5–15.5)
WBC: 6.6 10*3/uL (ref 4.0–10.5)
nRBC: 0 % (ref 0.0–0.2)

## 2021-11-14 LAB — BASIC METABOLIC PANEL
Anion gap: 5 (ref 5–15)
BUN: 10 mg/dL (ref 6–20)
CO2: 24 mmol/L (ref 22–32)
Calcium: 8.7 mg/dL — ABNORMAL LOW (ref 8.9–10.3)
Chloride: 107 mmol/L (ref 98–111)
Creatinine, Ser: 0.56 mg/dL (ref 0.44–1.00)
GFR, Estimated: >60 mL/min (ref >60)
Glucose, Bld: 166 mg/dL — ABNORMAL HIGH (ref 70–99)
Potassium: 3.8 mmol/L (ref 3.5–5.1)
Sodium: 136 mmol/L (ref 135–145)

## 2021-11-14 LAB — HEMOGLOBIN A1C
Hgb A1c MFr Bld: 7.5 % — ABNORMAL HIGH (ref 4.8–5.6)
Mean Plasma Glucose: 169 mg/dL

## 2021-11-14 MED ORDER — HYDRALAZINE HCL 20 MG/ML IJ SOLN
20.0000 mg | Freq: Four times a day (QID) | INTRAMUSCULAR | Status: DC | PRN
Start: 2021-11-14 — End: 2021-11-14

## 2021-11-14 NOTE — Evaluation (Signed)
Physical Therapy Evaluation Patient Details Name: Katie Gilbert MRN: [id number] DOB: [birth year] Today's Date: 11/14/2021  History of Present Illness  Pt admitted for syncope with complaints of N/V. History includes of DM and gastroparesis.  Clinical Impression  Pt is a pleasant 30 year old female who was admitted for syncope. Pt performs bed mobility with supervision, transfers with min assist, and ambulation with cga and RW. Pt demonstrates deficits with strength/ambulation/mobility. Pt with severe atrophy on B UE/LE. Encouraged to continue to ambulate with RW at all time due to weakness and to prevent falls risk. Would benefit from skilled PT to address above deficits and promote optimal return to PLOF. Pt currently set up for OP PT and recommend continue with OP. Performed orthostatics.  Supine: 136/92 Seated: 136/94 Standing: 102/66     Recommendations for follow up therapy are one component of a multi-disciplinary discharge planning process, led by the attending physician.  Recommendations may be updated based on patient status, additional functional criteria and insurance authorization.  Follow Up Recommendations Outpatient PT    Assistance Recommended at Discharge PRN  Functional Status Assessment Patient has had a recent decline in their functional status and demonstrates the ability to make significant improvements in function in a reasonable and predictable amount of time.  Equipment Recommendations  None recommended by PT    Recommendations for Other Services       Precautions / Restrictions Precautions Precautions: Fall Restrictions Weight Bearing Restrictions: No      Mobility  Bed Mobility Overal bed mobility: Needs Assistance Bed Mobility: Supine to Sit     Supine to sit: Supervision     General bed mobility comments: safe technique with slow transfer. No dizziness once seated at EOB    Transfers Overall transfer level: Needs assistance Equipment  used: Rolling walker (2 wheels) Transfers: Sit to/from Stand Sit to Stand: Min assist           General transfer comment: needs assist from low surface. Once standing, upright posture noted. Further transfers performed from elevated bed with cga.    Ambulation/Gait Ambulation/Gait assistance: Min guard Gait Distance (Feet): 80 Feet Assistive device: Rolling walker (2 wheels) Gait Pattern/deviations: Step-through pattern       General Gait Details: ambulated 2 laps in room with increased fatigue noted. No dizziness.  Stairs            Wheelchair Mobility    Modified Rankin (Stroke Patients Only)       Balance Overall balance assessment: Needs assistance Sitting-balance support: No upper extremity supported;Feet supported Sitting balance-Leahy Scale: Good     Standing balance support: Bilateral upper extremity supported Standing balance-Leahy Scale: Good                               Pertinent Vitals/Pain Pain Assessment: No/denies pain    Home Living Family/patient expects to be discharged to:: Private residence Living Arrangements: Parent Available Help at Discharge: Family Type of Home: Apartment Home Access: Level entry       Home Layout: One level Home Equipment: Conservation officer, nature (2 wheels);Shower seat      Prior Function Prior Level of Function : Independent/Modified Independent             Mobility Comments: ambulates household distances using RW. Has been doing OP PT       Hand Dominance        Extremity/Trunk Assessment   Upper Extremity Assessment Upper  Extremity Assessment: Generalized weakness (B UE grossly 3+/5)    Lower Extremity Assessment Lower Extremity Assessment: Generalized weakness (B LE grossly 3-/5)       Communication   Communication: No difficulties  Cognition Arousal/Alertness: Awake/alert Behavior During Therapy: WFL for tasks assessed/performed Overall Cognitive Status: Within Functional  Limits for tasks assessed                                          General Comments      Exercises Other Exercises Other Exercises: supine/seated ther-ex performed on B LE including AP, quad sets, SLRs, and LAQ. 10 reps performed   Assessment/Plan    PT Assessment All further PT needs can be met in the next venue of care  PT Problem List Decreased strength;Decreased balance;Decreased mobility       PT Treatment Interventions      PT Goals (Current goals can be found in the Care Plan section)  Acute Rehab PT Goals Patient Stated Goal: to go home PT Goal Formulation: With patient Time For Goal Achievement: 11/28/21 Potential to Achieve Goals: Good    Frequency     Barriers to discharge        Co-evaluation               AM-PAC PT "6 Clicks" Mobility  Outcome Measure Help needed turning from your back to your side while in a flat bed without using bedrails?: None Help needed moving from lying on your back to sitting on the side of a flat bed without using bedrails?: None Help needed moving to and from a bed to a chair (including a wheelchair)?: A Little Help needed standing up from a chair using your arms (e.g., wheelchair or bedside chair)?: A Little Help needed to walk in hospital room?: A Little Help needed climbing 3-5 steps with a railing? : A Little 6 Click Score: 20    End of Session Equipment Utilized During Treatment: Gait belt Activity Tolerance: Patient tolerated treatment well Patient left: in bed (seated on EOB, no recliner in room) Nurse Communication: Mobility status PT Visit Diagnosis: Difficulty in walking, not elsewhere classified (R26.2)    Time: 1610-9604 PT Time Calculation (min) (ACUTE ONLY): 38 min   Charges:   PT Evaluation $PT Eval Low Complexity: 1 Low PT Treatments $Gait Training: 8-22 mins $Therapeutic Exercise: 8-22 mins        Greggory Stallion, PT, DPT [phone]   Dereon Corkery 11/14/2021, 1:23  PM

## 2021-11-14 NOTE — Plan of Care (Signed)
  Problem: Activity: Goal: Risk for activity intolerance will decrease Outcome: Not Progressing   Problem: Pain Managment: Goal: General experience of comfort will improve Outcome: Not Progressing   Problem: Safety: Goal: Ability to remain free from injury will improve Outcome: Not Progressing   

## 2021-11-14 NOTE — Discharge Summary (Signed)
Physician Discharge Summary  Katie Gilbert NWG:[id number] DOB: November 07, 1991 DOA: 11/12/2021  PCP: Mechele Claude, FNP  Admit date: 11/12/2021 Discharge date: 11/14/2021  Admitted From: home  Disposition:  home   Recommendations for Outpatient Follow-up:  Follow up with PCP in 1-2 weeks Will need a referral from PCP to cardio for echo findings  Home Health:no Equipment/Devices: Discharge Condition: stable CODE STATUS: full  Diet recommendation: carb modified   Brief/Interim Summary: HPI was taken from Dr. Francine Graven: Katie Gilbert is a 30 y.o. female with medical history significant for diabetes mellitus who presents to the emergency room via EMS for evaluation after she had a witnessed syncopal episode at home. Patient states that she has had intermittent nausea and vomiting for several months associated with abdominal pain and has had multiple ER visits and hospitalization for same.  She recently had an upper endoscopy for further evaluation of her symptoms but does not know the results of findings from the study. For the last several days she has had persistent nausea, vomiting and inability to tolerate any oral intake.  Emesis is usually nonbloody and nonbilious. Her brother was assisting her to the bathroom when he noticed that she suddenly went limp and so he lowered her to the ground.  Patient was said to have lost consciousness transiently.  When EMS arrived she was noted to be hypotensive with systolic blood pressure in the 70s. Abdominal pain is mostly in the epigastrium, nonradiating and is described as a sharp pain.  She denies having any relieving or aggravating factors.  She has had significant weight loss from her symptoms over the last several months. She denies having any fever, no chills, no cough, no palpitations, no diaphoresis, no leg swelling, no headache, no chest pain, shortness of breath, no focal deficits, no blurred vision, no urinary symptoms. Sodium 131,  potassium 4.1, chloride 98, bicarb 23 glucose 250, BUN 10, creatinine 1.0, calcium 9.5, alkaline phosphatase 78, albumin four-point, lipase 21, AST 17, ALT 13, total protein 7.7, lactic acid 1.2, procalcitonin 0.51, white count 10.2, hemoglobin 12.4, hematocrit 35.5, MCV 83.9, RDW 12.7, platelet count 335 Respiratory viral panel is negative Urinalysis is sterile CT scan of abdomen and pelvis shows no acute findings. CT scan of the head without contrast shows no acute intracranial process Twelve-lead EKG reviewed by me shows sinus tachycardia.  Low voltage QRS.       ED Course: Patient is a 30 year old female who presents to the ER for evaluation after she had a witnessed syncopal episode at home. She was hypotensive and tachycardic upon arrival to the ER with systolic blood pressure in the 70s and received 1 L fluid bolus with improvement in her blood pressure and resolution of tachycardia. She will be referred to observation status for further evaluation   Hospital course from Dr. Jimmye Norman 11/27-11/28/22: Pt presented after a syncopal episode that likely vasovagal & related to orthostatic hypotension as pt's BP was in 70s upon arrival to ER. Pt did receive IVFs while in the ER. Echo showed EF 57-84%, grade I diastolic dysfunction & no regional wall motion abnormalities. PT evaluated the pt and recommended outpatient PT. Pt was already doing outpatient PT  Discharge Diagnoses:  Principal Problem:   Syncope, vasovagal Active Problems:   Diabetes mellitus ([place])   Diabetic gastroparesis ([place])  Syncope: etiology likely vasovagal & related to orthostatic hypotension. BP was in 70s upon arrival as pt had volume loss from nausea, vomiting, & diarrhea.  Echo shows EF is  67-61%, grade I diastolic dysfunction & no regional wall motion abnormalities   Generalized weakness: PT recs outpatient PT   Refractory nausea and vomiting: etiology unclear, possibly secondary to diabetic gastroparesis. Resolved.     DM2: likely poorly controlled. HbA1c 7.5. Continue on glargine, SSI w/ accuchecks   Discharge Instructions  Discharge Instructions     Diet - low sodium heart healthy   Complete by: As directed    Diet Carb Modified   Complete by: As directed    Discharge instructions   Complete by: As directed    F/u w/ PCP in 1 week. Will need to get a referral from your PCP to see a cardiologist for echocardiogram findings.   Increase activity slowly   Complete by: As directed       Allergies as of 11/14/2021       Reactions   Haldol [haloperidol] Other (See Comments)   Dystonic reaction        Medication List     STOP taking these medications    metFORMIN 500 MG tablet Commonly known as: Glucophage       TAKE these medications    acetaminophen 325 MG tablet Commonly known as: TYLENOL Take 2 tablets (650 mg total) by mouth every 6 (six) hours as needed for mild pain (or Fever >/= 101).   ascorbic acid 500 MG tablet Commonly known as: VITAMIN C Take 1 tablet (500 mg total) by mouth 2 (two) times daily.   blood glucose meter kit and supplies Kit Dispense based on patient and insurance preference. Use up to four times daily as directed. (FOR ICD-9 250.00, 250.01).   butalbital-acetaminophen-caffeine 50-325-40 MG tablet Commonly known as: FIORICET Take 1-2 tablets by mouth every 6 (six) hours as needed for headache.   INS SYRINGE/NEEDLE .5CC/27G 27G X 1/2" 0.5 ML Misc Needs enough syringes for lantus daily and aspart/novolog TID x 30 days. Dispense 1 box   insulin aspart 100 UNIT/ML injection Commonly known as: novoLOG Inject 4 Units into the skin 3 (three) times daily with meals.   insulin aspart 100 UNIT/ML injection Commonly known as: novoLOG Inject 4 Units into the skin 3 (three) times daily.   insulin glargine 100 UNIT/ML injection Commonly known as: LANTUS Inject 15 Units into the skin daily.   insulin glargine 100 UNIT/ML injection Commonly known as:  LANTUS Inject 0.15 mLs (15 Units total) into the skin daily.   metoCLOPramide 10 MG tablet Commonly known as: REGLAN Take 10 mg by mouth 4 (four) times daily.   ondansetron 4 MG disintegrating tablet Commonly known as: Zofran ODT Allow 1-2 tablets to dissolve in your mouth every 8 hours as needed for nausea/vomiting        Allergies  Allergen Reactions   Haldol [Haloperidol] Other (See Comments)    Dystonic reaction    Consultations:    Procedures/Studies: CT HEAD WO CONTRAST  Result Date: 11/12/2021 CLINICAL DATA:  Dizziness, nonspecific EXAM: CT HEAD WITHOUT CONTRAST TECHNIQUE: Contiguous axial images were obtained from the base of the skull through the vertex without intravenous contrast. COMPARISON:  05/05/2021. FINDINGS: Brain: No evidence of acute infarction, hemorrhage, cerebral edema, mass, mass effect, or midline shift. No hydrocephalus or extra-axial fluid collection. Vascular: No hyperdense vessel. Skull: Normal. Negative for fracture or focal lesion. Sinuses/Orbits: Minimal mucosal thickening in the ethmoid air cells. The orbits are unremarkable. Other: None. IMPRESSION: No acute intracranial process. Electronically Signed   By: Merilyn Baba M.D.   On: 11/12/2021 00:11   CT  ABDOMEN PELVIS W CONTRAST  Result Date: 11/12/2021 CLINICAL DATA:  Acute nonlocalized abdominal pain. History of gastro paresis. EXAM: CT ABDOMEN AND PELVIS WITH CONTRAST TECHNIQUE: Multidetector CT imaging of the abdomen and pelvis was performed using the standard protocol following bolus administration of intravenous contrast. CONTRAST:  192m OMNIPAQUE IOHEXOL 300 MG/ML  SOLN COMPARISON:  06/10/2021 FINDINGS: Lower chest:  No contributory findings. Hepatobiliary: No focal liver abnormality.No evidence of biliary obstruction or stone. Pancreas: Unremarkable. Spleen: Unremarkable. Adrenals/Urinary Tract: Negative adrenals. No hydronephrosis or stone. Unremarkable bladder. Stomach/Bowel:  No  obstruction. No appendicitis. Vascular/Lymphatic: No acute vascular abnormality. No mass or adenopathy. Reproductive:No pathologic findings. Other: Small volume pelvic fluid, usually physiologic. Corpus luteum on the right. Musculoskeletal: No acute abnormalities. IMPRESSION: No acute finding. Trace pelvic fluid, usually physiologic. Electronically Signed   By: JJorje GuildM.D.   On: 11/12/2021 05:54   DG Chest Portable 1 View  Result Date: 11/12/2021 CLINICAL DATA:  30year old female with history of possible sepsis. EXAM: PORTABLE CHEST 1 VIEW COMPARISON:  Chest x-ray 01/04/2017. FINDINGS: Lung volumes are normal. No consolidative airspace disease. No pleural effusions. No pneumothorax. No pulmonary nodule or mass noted. Pulmonary vasculature and the cardiomediastinal silhouette are within normal limits. IMPRESSION: No radiographic evidence of acute cardiopulmonary disease. Electronically Signed   By: DVinnie LangtonM.D.   On: 11/12/2021 07:01   DG Abd Portable 1V  Result Date: 11/13/2021 CLINICAL DATA:  Generalized abdominal pain. EXAM: PORTABLE ABDOMEN - 1 VIEW COMPARISON:  CT abdomen pelvis dated 11/12/2021. FINDINGS: No bowel dilatation or evidence of obstruction. No free air or radiopaque calculi. The osseous structures are intact. The soft tissues are unremarkable. IMPRESSION: Negative. Electronically Signed   By: AAnner CreteM.D.   On: 11/13/2021 20:01   ECHOCARDIOGRAM COMPLETE  Result Date: 11/13/2021    ECHOCARDIOGRAM REPORT   Patient Name:   LDULCY SIDADate of Exam: 11/13/2021 Medical Rec #:  [id number]       Height:       75.0 in Accession #:    [id number]      Weight:       232.8 lb Date of Birth:  615-May-1992       BSA:          2.341 m Patient Age:    30 years         BP:           147/95 mmHg Patient Gender: F                HR:           98 bpm. Exam Location:  [facility] Procedure: 2D Echo, Color Doppler and Cardiac Doppler Indications:     R55 Syncope  History:          Patient has no prior history of Echocardiogram examinations.                  Risk Factors:Diabetes.  Sonographer:     JCharmayne SheerReferring Phys:  [id number] Diagnosing Phys: TIda RogueMD  Sonographer Comments: Suboptimal parasternal window and no subcostal window. IMPRESSIONS  1. Left ventricular ejection fraction, by estimation, is 60 to 65%. The left ventricle has normal function. The left ventricle has no regional wall motion abnormalities. Left ventricular diastolic parameters are consistent with Grade I diastolic dysfunction (impaired relaxation).  2. Right ventricular systolic function is normal. The right ventricular size is normal. Tricuspid regurgitation signal is inadequate for assessing  PA pressure.  3. The mitral valve is normal in structure. No evidence of mitral valve regurgitation. No evidence of mitral stenosis.  4. The aortic valve is normal in structure. Aortic valve regurgitation is not visualized. No aortic stenosis is present.  5. The inferior vena cava is normal in size with greater than 50% respiratory variability, suggesting right atrial pressure of 3 mmHg. FINDINGS  Left Ventricle: Left ventricular ejection fraction, by estimation, is 60 to 65%. The left ventricle has normal function. The left ventricle has no regional wall motion abnormalities. The left ventricular internal cavity size was normal in size. There is  no left ventricular hypertrophy. Left ventricular diastolic parameters are consistent with Grade I diastolic dysfunction (impaired relaxation). Right Ventricle: The right ventricular size is normal. No increase in right ventricular wall thickness. Right ventricular systolic function is normal. Tricuspid regurgitation signal is inadequate for assessing PA pressure. Left Atrium: Left atrial size was normal in size. Right Atrium: Right atrial size was normal in size. Pericardium: There is no evidence of pericardial effusion. Mitral Valve: The mitral valve is normal  in structure. No evidence of mitral valve regurgitation. No evidence of mitral valve stenosis. MV peak gradient, 4.9 mmHg. The mean mitral valve gradient is 3.0 mmHg. Tricuspid Valve: The tricuspid valve is normal in structure. Tricuspid valve regurgitation is mild . No evidence of tricuspid stenosis. Aortic Valve: The aortic valve is normal in structure. Aortic valve regurgitation is not visualized. No aortic stenosis is present. Aortic valve mean gradient measures 5.0 mmHg. Aortic valve peak gradient measures 8.9 mmHg. Aortic valve area, by VTI measures 3.23 cm. Pulmonic Valve: The pulmonic valve was normal in structure. Pulmonic valve regurgitation is not visualized. No evidence of pulmonic stenosis. Aorta: The aortic root is normal in size and structure. Venous: The inferior vena cava is normal in size with greater than 50% respiratory variability, suggesting right atrial pressure of 3 mmHg. IAS/Shunts: No atrial level shunt detected by color flow Doppler.  LEFT VENTRICLE PLAX 2D LVIDd:         4.30 cm   Diastology LVIDs:         2.70 cm   LV e' medial:    9.36 cm/s LV PW:         1.30 cm   LV E/e' medial:  4.8 LV IVS:        0.70 cm   LV e' lateral:   8.70 cm/s LVOT diam:     2.10 cm   LV E/e' lateral: 5.1 LV SV:         77 LV SV Index:   33 LVOT Area:     3.46 cm  RIGHT VENTRICLE RV Basal diam:  2.50 cm LEFT ATRIUM             Index        RIGHT ATRIUM           Index LA diam:        3.00 cm 1.28 cm/m   RA Area:     11.90 cm LA Vol (A2C):   51.7 ml 22.09 ml/m  RA Volume:   25.20 ml  10.77 ml/m LA Vol (A4C):   27.1 ml 11.58 ml/m LA Biplane Vol: 37.3 ml 15.94 ml/m  AORTIC VALVE                     PULMONIC VALVE AV Area (Vmax):    2.58 cm      PV Vmax:  1.16 m/s AV Area (Vmean):   2.54 cm      PV Vmean:      77.800 cm/s AV Area (VTI):     3.23 cm      PV VTI:        0.209 m AV Vmax:           149.00 cm/s   PV Peak grad:  5.4 mmHg AV Vmean:          108.000 cm/s  PV Mean grad:  3.0 mmHg AV VTI:             0.237 m AV Peak Grad:      8.9 mmHg AV Mean Grad:      5.0 mmHg LVOT Vmax:         111.00 cm/s LVOT Vmean:        79.300 cm/s LVOT VTI:          0.221 m LVOT/AV VTI ratio: 0.93  AORTA Ao Root diam: 2.30 cm MITRAL VALVE MV Area (PHT): 9.60 cm    SHUNTS MV Area VTI:   3.05 cm    Systemic VTI:  0.22 m MV Peak grad:  4.9 mmHg    Systemic Diam: 2.10 cm MV Mean grad:  3.0 mmHg MV Vmax:       1.11 m/s MV Vmean:      74.4 cm/s MV Decel Time: 79 msec MV E velocity: 44.60 cm/s MV A velocity: 96.80 cm/s MV E/A ratio:  0.46 Ida Rogue MD Electronically signed by Ida Rogue MD Signature Date/Time: 11/13/2021/1:44:37 PM    Final    (Echo, Carotid, EGD, Colonoscopy, ERCP)    Subjective: Pt denies any complaints    Discharge Exam: Vitals:   11/14/21 0811 11/14/21 1348  BP: (!) 155/103 (!) 154/110  Pulse: (!) 103   Resp: 18   Temp: 99 F (37.2 C)   SpO2: 100%    Vitals:   11/14/21 0358 11/14/21 0527 11/14/21 0811 11/14/21 1348  BP: (!) 141/97  (!) 155/103 (!) 154/110  Pulse: 100  (!) 103   Resp: 18  18   Temp: 98.2 F (36.8 C)  99 F (37.2 C)   TempSrc: Oral  Oral   SpO2: 99%  100%   Weight:  106 kg    Height:        General: Pt is alert, awake, not in acute distress Cardiovascular: S1/S2 +, no rubs, no gallops Respiratory: CTA bilaterally, no wheezing, no rhonchi Abdominal: Soft, NT, ND, bowel sounds + Extremities: no edema, no cyanosis    The results of significant diagnostics from this hospitalization (including imaging, microbiology, ancillary and laboratory) are listed below for reference.     Microbiology: Recent Results (from the past 240 hour(s))  Urine Culture     Status: Abnormal   Collection Time: 11/11/21 11:15 PM   Specimen: In/Out Cath Urine  Result Value Ref Range Status   Specimen Description   Final    IN/OUT CATH URINE Performed at [facility], [address] [place], [place] [address]    Special Requests   Final    NONE Performed  at [facility], [address] [place], [place] [address]    Culture MULTIPLE SPECIES PRESENT, SUGGEST RECOLLECTION (A)  Final   Report Status 11/13/2021 FINAL  Final  Resp Panel by RT-PCR (Flu A&B, Covid) Nasopharyngeal Swab     Status: None   Collection Time: 11/12/21  3:31 AM   Specimen: Nasopharyngeal Swab; Nasopharyngeal(NP)  swabs in vial transport medium  Result Value Ref Range Status   SARS Coronavirus 2 by RT PCR NEGATIVE NEGATIVE Final    Comment: (NOTE) SARS-CoV-2 target nucleic acids are NOT DETECTED.  The SARS-CoV-2 RNA is generally detectable in upper respiratory specimens during the acute phase of infection. The lowest concentration of SARS-CoV-2 viral copies this assay can detect is 138 copies/mL. A negative result does not preclude SARS-Cov-2 infection and should not be used as the sole basis for treatment or other patient management decisions. A negative result may occur with  improper specimen collection/handling, submission of specimen other than nasopharyngeal swab, presence of viral mutation(s) within the areas targeted by this assay, and inadequate number of viral copies(<138 copies/mL). A negative result must be combined with clinical observations, patient history, and epidemiological information. The expected result is Negative.  Fact Sheet for Patients:  EntrepreneurPulse.com.au  Fact Sheet for Healthcare Providers:  IncredibleEmployment.be  This test is no t yet approved or cleared by the [place] FDA and  has been authorized for detection and/or diagnosis of SARS-CoV-2 by FDA under an Emergency Use Authorization (EUA). This EUA will remain  in effect (meaning this test can be used) for the duration of the COVID-19 declaration under Section 564(b)(1) of the Act, 21 U.S.C.section 360bbb-3(b)(1), unless the authorization is terminated  or revoked sooner.       Influenza A by PCR NEGATIVE NEGATIVE  Final   Influenza B by PCR NEGATIVE NEGATIVE Final    Comment: (NOTE) The Xpert Xpress SARS-CoV-2/FLU/RSV plus assay is intended as an aid in the diagnosis of influenza from Nasopharyngeal swab specimens and should not be used as a sole basis for treatment. Nasal washings and aspirates are unacceptable for Xpert Xpress SARS-CoV-2/FLU/RSV testing.  Fact Sheet for Patients: EntrepreneurPulse.com.au  Fact Sheet for Healthcare Providers: IncredibleEmployment.be  This test is not yet approved or cleared by the [place] FDA and has been authorized for detection and/or diagnosis of SARS-CoV-2 by FDA under an Emergency Use Authorization (EUA). This EUA will remain in effect (meaning this test can be used) for the duration of the COVID-19 declaration under Section 564(b)(1) of the Act, 21 U.S.C. section 360bbb-3(b)(1), unless the authorization is terminated or revoked.  Performed at [facility], [address] [place], [place] [address]   Blood Culture (routine x 2)     Status: None (Preliminary result)   Collection Time: 11/12/21  3:31 AM   Specimen: BLOOD  Result Value Ref Range Status   Specimen Description BLOOD RAC  Final   Special Requests   Final    BOTTLES DRAWN AEROBIC AND ANAEROBIC Blood Culture results may not be optimal due to an excessive volume of blood received in culture bottles   Culture   Final    NO GROWTH 2 DAYS Performed at [facility], [address], [place], [place] [address]    Report Status PENDING  Incomplete  Blood Culture (routine x 2)     Status: None (Preliminary result)   Collection Time: 11/12/21  3:49 PM   Specimen: BLOOD  Result Value Ref Range Status   Specimen Description BLOOD BLOOD RIGHT HAND  Final   Special Requests   Final    BOTTLES DRAWN AEROBIC AND ANAEROBIC Blood Culture adequate volume   Culture   Final    NO GROWTH 2 DAYS Performed at [facility], [address], [place], [place] [address]    Report Status PENDING  Incomplete  Labs: BNP (last 3 results) No results for input(s): BNP in the last 8760 hours. Basic Metabolic Panel: Recent Labs  Lab 11/11/21 2315 11/13/21 0538 11/14/21 0427  NA 131* 135 136  K 4.1 3.6 3.8  CL 98 105 107  CO2 _0 GLUCOSE 250* 204* 166*  BUN _1 CREATININE 1.00 0.65 0.56  CALCIUM 9.5 8.6* 8.7*   Liver Function Tests: Recent Labs  Lab 11/11/21 2315 11/13/21 2135  AST 17 15  ALT 13 13  ALKPHOS 78 59  BILITOT 1.2 0.5  PROT 7.7 6.2*  ALBUMIN 4.2 3.3*   Recent Labs  Lab 11/11/21 2315 11/13/21 2135  LIPASE 21 27   No results for input(s): AMMONIA in the last 168 hours. CBC: Recent Labs  Lab 11/11/21 2315 11/13/21 0538 11/14/21 0427  WBC 10.2 7.1 6.6  HGB 12.4 9.7* 9.3*  HCT 35.5* 28.5* 26.9*  MCV 83.9 84.8 85.9  PLT 335 217 222   Cardiac Enzymes: No results for input(s): CKTOTAL, CKMB, CKMBINDEX, TROPONINI in the last 168 hours. BNP: Invalid input(s): POCBNP CBG: Recent Labs  Lab 11/13/21 1153 11/13/21 1659 11/13/21 2054 11/14/21 0812 11/14/21 1310  GLUCAP 133* 111* 139* 159* 156*   D-Dimer No results for input(s): DDIMER in the last 72 hours. Hgb A1c Recent Labs    11/12/21 1550  HGBA1C 7.5*   Lipid Profile No results for input(s): CHOL, HDL, LDLCALC, TRIG, CHOLHDL, LDLDIRECT in the last 72 hours. Thyroid function studies No results for input(s): TSH, T4TOTAL, T3FREE, THYROIDAB in the last 72 hours.  Invalid input(s): FREET3 Anemia work up No results for input(s): VITAMINB12, FOLATE, FERRITIN, TIBC, IRON, RETICCTPCT in the last 72 hours. Urinalysis    Component Value Date/Time   COLORURINE YELLOW (A) 11/11/2021 2335   APPEARANCEUR CLEAR 11/11/2021 2335   APPEARANCEUR Cloudy 07/15/2014 1806   LABSPEC 1.025 11/11/2021 2335   LABSPEC 1.039 07/15/2014 1806   PHURINE 6.0 11/11/2021 2335   GLUCOSEU NEGATIVE 11/11/2021 2335   GLUCOSEU >=500  07/15/2014 1806   HGBUR TRACE (A) 11/11/2021 2335   BILIRUBINUR NEGATIVE 11/11/2021 2335   BILIRUBINUR Negative 07/15/2014 1806   KETONESUR 15 (A) 11/11/2021 2335   PROTEINUR 100 (A) 11/11/2021 2335   NITRITE NEGATIVE 11/11/2021 2335   LEUKOCYTESUR NEGATIVE 11/11/2021 2335   LEUKOCYTESUR 3+ 07/15/2014 1806   Sepsis Labs Invalid input(s): PROCALCITONIN,  WBC,  LACTICIDVEN Microbiology Recent Results (from the past 240 hour(s))  Urine Culture     Status: Abnormal   Collection Time: 11/11/21 11:15 PM   Specimen: In/Out Cath Urine  Result Value Ref Range Status   Specimen Description   Final    IN/OUT CATH URINE Performed at [facility], [address] [place], [place] [address]    Special Requests   Final    NONE Performed at [facility], [address] [place], [place] [address]    Culture MULTIPLE SPECIES PRESENT, SUGGEST RECOLLECTION (A)  Final   Report Status 11/13/2021 FINAL  Final  Resp Panel by RT-PCR (Flu A&B, Covid) Nasopharyngeal Swab     Status: None   Collection Time: 11/12/21  3:31 AM   Specimen: Nasopharyngeal Swab; Nasopharyngeal(NP) swabs in vial transport medium  Result Value Ref Range Status   SARS Coronavirus 2 by RT PCR NEGATIVE NEGATIVE Final    Comment: (NOTE) SARS-CoV-2 target nucleic acids are NOT DETECTED.  The SARS-CoV-2 RNA is generally detectable in upper respiratory specimens during the acute phase of infection. The lowest concentration  of SARS-CoV-2 viral copies this assay can detect is 138 copies/mL. A negative result does not preclude SARS-Cov-2 infection and should not be used as the sole basis for treatment or other patient management decisions. A negative result may occur with  improper specimen collection/handling, submission of specimen other than nasopharyngeal swab, presence of viral mutation(s) within the areas targeted by this assay, and inadequate number of viral copies(<138 copies/mL). A negative result must  be combined with clinical observations, patient history, and epidemiological information. The expected result is Negative.  Fact Sheet for Patients:  EntrepreneurPulse.com.au  Fact Sheet for Healthcare Providers:  IncredibleEmployment.be  This test is no t yet approved or cleared by the [place] FDA and  has been authorized for detection and/or diagnosis of SARS-CoV-2 by FDA under an Emergency Use Authorization (EUA). This EUA will remain  in effect (meaning this test can be used) for the duration of the COVID-19 declaration under Section 564(b)(1) of the Act, 21 U.S.C.section 360bbb-3(b)(1), unless the authorization is terminated  or revoked sooner.       Influenza A by PCR NEGATIVE NEGATIVE Final   Influenza B by PCR NEGATIVE NEGATIVE Final    Comment: (NOTE) The Xpert Xpress SARS-CoV-2/FLU/RSV plus assay is intended as an aid in the diagnosis of influenza from Nasopharyngeal swab specimens and should not be used as a sole basis for treatment. Nasal washings and aspirates are unacceptable for Xpert Xpress SARS-CoV-2/FLU/RSV testing.  Fact Sheet for Patients: EntrepreneurPulse.com.au  Fact Sheet for Healthcare Providers: IncredibleEmployment.be  This test is not yet approved or cleared by the [place] FDA and has been authorized for detection and/or diagnosis of SARS-CoV-2 by FDA under an Emergency Use Authorization (EUA). This EUA will remain in effect (meaning this test can be used) for the duration of the COVID-19 declaration under Section 564(b)(1) of the Act, 21 U.S.C. section 360bbb-3(b)(1), unless the authorization is terminated or revoked.  Performed at [facility], [address] [place], [place] [address]   Blood Culture (routine x 2)     Status: None (Preliminary result)   Collection Time: 11/12/21  3:31 AM   Specimen: BLOOD  Result Value Ref Range Status    Specimen Description BLOOD RAC  Final   Special Requests   Final    BOTTLES DRAWN AEROBIC AND ANAEROBIC Blood Culture results may not be optimal due to an excessive volume of blood received in culture bottles   Culture   Final    NO GROWTH 2 DAYS Performed at [facility], [address], [place], [place] [address]    Report Status PENDING  Incomplete  Blood Culture (routine x 2)     Status: None (Preliminary result)   Collection Time: 11/12/21  3:49 PM   Specimen: BLOOD  Result Value Ref Range Status   Specimen Description BLOOD BLOOD RIGHT HAND  Final   Special Requests   Final    BOTTLES DRAWN AEROBIC AND ANAEROBIC Blood Culture adequate volume   Culture   Final    NO GROWTH 2 DAYS Performed at [facility], [address], [place],  [address]    Report Status PENDING  Incomplete     Time coordinating discharge: Over 30 minutes  SIGNED:   Wyvonnia Dusky, MD  [facility] 11/14/2021, 2:15 PM Pager   If 7PM-7AM, please contact night-coverage

## 2021-11-17 LAB — CULTURE, BLOOD (ROUTINE X 2)
Culture: NO GROWTH
Culture: NO GROWTH
Special Requests: ADEQUATE

## 2021-11-18 ENCOUNTER — Ambulatory Visit
Admission: RE | Admit: 2021-11-18 | Discharge: 2021-11-18 | Disposition: A | Discharge status: Home / Self Care | Payer: Managed Care, Other (non HMO) | Source: Ambulatory Visit | Attending: Gastroenterology | Admitting: Gastroenterology

## 2021-11-18 ENCOUNTER — Other Ambulatory Visit: Payer: Self-pay

## 2021-11-18 DIAGNOSIS — R634 Abnormal weight loss: Secondary | ICD-10-CM | POA: Diagnosis present

## 2021-11-18 DIAGNOSIS — R1084 Generalized abdominal pain: Secondary | ICD-10-CM | POA: Insufficient documentation

## 2021-11-18 DIAGNOSIS — R5383 Other fatigue: Secondary | ICD-10-CM | POA: Diagnosis present

## 2021-11-18 DIAGNOSIS — R1013 Epigastric pain: Secondary | ICD-10-CM | POA: Diagnosis present

## 2021-11-18 DIAGNOSIS — R112 Nausea with vomiting, unspecified: Secondary | ICD-10-CM | POA: Insufficient documentation

## 2021-11-18 MED ORDER — TECHNETIUM TC 99M SULFUR COLLOID: 2.0000 | Freq: Once | INTRAVENOUS | Status: DC | PRN

## 2021-11-18 MED ORDER — TECHNETIUM TC 99M SULFUR COLLOID
2.0000 | Freq: Once | INTRAVENOUS | Status: AC | PRN
  Administered 2021-11-18: 2.24 via ORAL

## 2023-03-08 ENCOUNTER — Ambulatory Visit: Payer: Managed Care, Other (non HMO) | Admitting: Family

## 2023-03-08 ENCOUNTER — Encounter: Payer: Self-pay | Admitting: Family

## 2023-03-08 VITALS — BP 134/88 | HR 103 | Ht 69.0 in | Wt 303.0 lb

## 2023-03-08 DIAGNOSIS — R5383 Other fatigue: Secondary | ICD-10-CM | POA: Diagnosis not present

## 2023-03-08 DIAGNOSIS — I10 Essential (primary) hypertension: Secondary | ICD-10-CM

## 2023-03-08 DIAGNOSIS — E538 Deficiency of other specified B group vitamins: Secondary | ICD-10-CM

## 2023-03-08 DIAGNOSIS — Z89421 Acquired absence of other right toe(s): Secondary | ICD-10-CM

## 2023-03-08 DIAGNOSIS — Z794 Long term (current) use of insulin: Secondary | ICD-10-CM

## 2023-03-08 DIAGNOSIS — E1159 Type 2 diabetes mellitus with other circulatory complications: Secondary | ICD-10-CM

## 2023-03-08 DIAGNOSIS — E669 Obesity, unspecified: Secondary | ICD-10-CM | POA: Diagnosis not present

## 2023-03-08 DIAGNOSIS — E782 Mixed hyperlipidemia: Secondary | ICD-10-CM

## 2023-03-08 DIAGNOSIS — E559 Vitamin D deficiency, unspecified: Secondary | ICD-10-CM

## 2023-03-08 DIAGNOSIS — R600 Localized edema: Secondary | ICD-10-CM

## 2023-03-08 LAB — POCT URINALYSIS DIPSTICK
Bilirubin, UA: NEGATIVE
Glucose, UA: POSITIVE — AB
Ketones, UA: NEGATIVE
Leukocytes, UA: NEGATIVE
Nitrite, UA: POSITIVE
Protein, UA: POSITIVE — AB
Spec Grav, UA: 1.015 (ref 1.010–1.025)
Urobilinogen, UA: 0.2 E.U./dL
pH, UA: 5 (ref 5.0–8.0)

## 2023-03-08 LAB — POC CREATINE & ALBUMIN,URINE
Albumin/Creatinine Ratio, Urine, POC: >300
Creatinine, POC: 50 mg/dL
Microalbumin Ur, POC: 150 mg/L

## 2023-03-08 NOTE — Progress Notes (Signed)
Established Patient Office Visit  Subjective:  Patient ID: Katie Gilbert, female    DOB: Jul 19, 1991  Age: 32 y.o. MRN: [id number]  Chief Complaint  Patient presents with   Follow-up    Abdominal pain    Patient here for follow up appointment.  She has been having significant anxiety Says she just feels overwhelmed all the time, and that she hasn't been taking her meds   She also has significant swelling in both legs, and says that it worsens as her day goes along.  She works from home and has to sit for multiple hours, and it will get better when she puts her legs up, but she says that during the week it never really gets completely better.  It makes her legs quite heavy, and makes it harder for her to walk.   She says that the depression just made her care less and stop taking care of herself as much.  She also says that she has not been leaning on her family, as she does not want to worry them.   No other concerns at this time.   Past Medical History:  Diagnosis Date   C. difficile enteritis 07/24/2021   Diarrhea 07/22/2021   Genital herpes    Nausea & vomiting 07/22/2021   Type II diabetes mellitus with renal manifestations [facility])     Past Surgical History:  Procedure Laterality Date   FOOT SURGERY Right    I & D EXTREMITY Right 01/17/2019   Procedure: IRRIGATION AND DEBRIDEMENT RIGHT FOOT;  Surgeon: Samara Deist, DPM;  Location: [facility] ORS;  Service: Podiatry;  Laterality: Right;    Social History   Socioeconomic History   Marital status: Single    Spouse name: Not on file   Number of children: Not on file   Years of education: Not on file   Highest education level: Not on file  Occupational History   Not on file  Tobacco Use   Smoking status: Light Smoker    Packs/day: .2    Types: Cigarettes   Smokeless tobacco: Never  Vaping Use   Vaping Use: Never used  Substance and Sexual Activity   Alcohol use: No   Drug use: No   Sexual activity: Not on file   Other Topics Concern   Not on file  Social History Narrative   Not on file   Social Determinants of Health   Financial Resource Strain: Not on file  Food Insecurity: Not on file  Transportation Needs: Not on file  Physical Activity: Not on file  Stress: Not on file  Social Connections: Not on file  Intimate Partner Violence: Not on file    Family History  Problem Relation Age of Onset   Hypertension Father    Heart attack Father    Diabetes Father    Diabetes Maternal Grandmother     Allergies  Allergen Reactions   Haloperidol Other (See Comments)    Dystonic reaction  Neck spasms    Review of Systems  Cardiovascular:  Positive for leg swelling.  Psychiatric/Behavioral:  Positive for depression. The patient is nervous/anxious and has insomnia.   All other systems reviewed and are negative.    Objective:   BP 134/88   Pulse (!) 103   Ht 5\' 9"  (1.753 m)   Wt (!) 303 lb (137.4 kg)   SpO2 95%   BMI 44.75 kg/m   Vitals:   03/08/23 1330  BP: 134/88  Pulse: (!) 103  Height: 5'  9" (1.753 m)  Weight: (!) 303 lb (137.4 kg)  SpO2: 95%  BMI (Calculated): 44.72    Physical Exam Vitals and nursing note reviewed.  Constitutional:      Appearance: Normal appearance. She is obese.  HENT:     Head: Normocephalic and atraumatic.  Eyes:     Extraocular Movements: Extraocular movements intact.     Conjunctiva/sclera: Conjunctivae normal.     Pupils: Pupils are equal, round, and reactive to light.  Cardiovascular:     Rate and Rhythm: Normal rate and regular rhythm.     Pulses: Normal pulses.     Heart sounds: Normal heart sounds.  Pulmonary:     Effort: Pulmonary effort is normal.     Breath sounds: Normal breath sounds.  Musculoskeletal:     Cervical back: Normal range of motion.  Neurological:     General: No focal deficit present.     Mental Status: She is alert and oriented to person, place, and time.     Motor: Weakness present.     Gait: Gait  abnormal.  Psychiatric:        Attention and Perception: Attention and perception normal.        Mood and Affect: Mood is anxious and depressed. Affect is tearful.        Speech: Speech normal.        Behavior: Behavior normal.        Thought Content: Thought content normal.        Cognition and Memory: Cognition and memory normal.        Judgment: Judgment normal.    Results for orders placed or performed in visit on 03/08/23  Lipid panel  Result Value Ref Range   Cholesterol, Total 269 (H) 100 - 199 mg/dL   Triglycerides 256 (H) 0 - 149 mg/dL   HDL 41 >39 mg/dL   VLDL Cholesterol Cal 49 (H) 5 - 40 mg/dL   LDL Chol Calc (NIH) 179 (H) 0 - 99 mg/dL   Chol/HDL Ratio 6.6 (H) 0.0 - 4.4 ratio  VITAMIN D 25 Hydroxy (Vit-D Deficiency, Fractures)  Result Value Ref Range   Vit D, 25-Hydroxy 5.1 (L) 30.0 - 100.0 ng/mL  CBC With Differential  Result Value Ref Range   WBC 8.2 3.4 - 10.8 x10E3/uL   RBC 4.38 3.77 - 5.28 x10E6/uL   Hemoglobin 11.9 11.1 - 15.9 g/dL   Hematocrit 37.2 34.0 - 46.6 %   MCV 85 79 - 97 fL   MCH 27.2 26.6 - 33.0 pg   MCHC 32.0 31.5 - 35.7 g/dL   RDW 12.7 11.7 - 15.4 %   Neutrophils 72 Not Estab. %   Lymphs 17 Not Estab. %   Monocytes 5 Not Estab. %   Eos 4 Not Estab. %   Basos 1 Not Estab. %   Neutrophils Absolute 6.0 1.4 - 7.0 x10E3/uL   Lymphocytes Absolute 1.4 0.7 - 3.1 x10E3/uL   Monocytes Absolute 0.4 0.1 - 0.9 x10E3/uL   EOS (ABSOLUTE) 0.3 0.0 - 0.4 x10E3/uL   Basophils Absolute 0.1 0.0 - 0.2 x10E3/uL   Immature Granulocytes 1 Not Estab. %   Immature Grans (Abs) 0.1 0.0 - 0.1 x10E3/uL  CMP14+EGFR  Result Value Ref Range   Glucose 542 (HH) 70 - 99 mg/dL   BUN 17 6 - 20 mg/dL   Creatinine, Ser 0.98 0.57 - 1.00 mg/dL   eGFR 79 >59 mL/min/1.73   BUN/Creatinine Ratio 17 9 - 23   Sodium 130 (  L) 134 - 144 mmol/L   Potassium 4.5 3.5 - 5.2 mmol/L   Chloride 94 (L) 96 - 106 mmol/L   CO2 21 20 - 29 mmol/L   Calcium 8.7 8.7 - 10.2 mg/dL   Total Protein  6.3 6.0 - 8.5 g/dL   Albumin 3.3 (L) 3.9 - 4.9 g/dL   Globulin, Total 3.0 1.5 - 4.5 g/dL   Albumin/Globulin Ratio 1.1 (L) 1.2 - 2.2   Bilirubin Total 0.2 0.0 - 1.2 mg/dL   Alkaline Phosphatase 137 (H) 44 - 121 IU/L   AST 14 0 - 40 IU/L   ALT 16 0 - 32 IU/L  TSH  Result Value Ref Range   TSH 1.430 0.450 - 4.500 uIU/mL  Hemoglobin A1c  Result Value Ref Range   Hgb A1c MFr Bld 14.6 (H) 4.8 - 5.6 %   Est. average glucose Bld gHb Est-mCnc 372 mg/dL  Vitamin B12  Result Value Ref Range   Vitamin B-12 355 232 - 1,245 pg/mL  POC CREATINE & ALBUMIN,URINE  Result Value Ref Range   Microalbumin Ur, POC 150 mg/L   Creatinine, POC 50 mg/dL   Albumin/Creatinine Ratio, Urine, POC >300   POCT Urinalysis Dipstick (81002)  Result Value Ref Range   Color, UA     Clarity, UA Cloudy    Glucose, UA Positive (A) Negative   Bilirubin, UA Negative    Ketones, UA Negative    Spec Grav, UA 1.015 1.010 - 1.025   Blood, UA Moderate    pH, UA 5.0 5.0 - 8.0   Protein, UA Positive (A) Negative   Urobilinogen, UA 0.2 0.2 or 1.0 E.U./dL   Nitrite, UA Positive    Leukocytes, UA Negative Negative   Appearance     Odor      Recent Results (from the past 2160 hour(s))  Lipid panel     Status: Abnormal   Collection Time: 03/08/23  2:23 PM  Result Value Ref Range   Cholesterol, Total 269 (H) 100 - 199 mg/dL   Triglycerides 256 (H) 0 - 149 mg/dL   HDL 41 >39 mg/dL   VLDL Cholesterol Cal 49 (H) 5 - 40 mg/dL   LDL Chol Calc (NIH) 179 (H) 0 - 99 mg/dL   Chol/HDL Ratio 6.6 (H) 0.0 - 4.4 ratio    Comment:                                   T. Chol/HDL Ratio                                             Men  Women                               1/2 Avg.Risk  3.4    3.3                                   Avg.Risk  5.0    4.4                                2X Avg.Risk  9.6  7.1                                3X Avg.Risk 23.4   11.0   VITAMIN D 25 Hydroxy (Vit-D Deficiency, Fractures)     Status: Abnormal    Collection Time: 03/08/23  2:23 PM  Result Value Ref Range   Vit D, 25-Hydroxy 5.1 (L) 30.0 - 100.0 ng/mL    Comment: Vitamin D deficiency has been defined by the [place] practice guideline as a level of serum 25-OH vitamin D less than 20 ng/mL (1,2). The Endocrine Society went on to further define vitamin D insufficiency as a level between 21 and 29 ng/mL (2). 1. IOM (Institute of Medicine). 2010. Dietary reference    intakes for calcium and D. [place]: The    [employer]. 2. Holick MF, [place], Bischoff-Ferrari HA, et al.    Evaluation, treatment, and prevention of vitamin D    deficiency: an Endocrine Society clinical practice    guideline. JCEM. 2011 Jul; 96(7):1911-30.   CBC With Differential     Status: None   Collection Time: 03/08/23  2:23 PM  Result Value Ref Range   WBC 8.2 3.4 - 10.8 x10E3/uL   RBC 4.38 3.77 - 5.28 x10E6/uL   Hemoglobin 11.9 11.1 - 15.9 g/dL   Hematocrit 37.2 34.0 - 46.6 %   MCV 85 79 - 97 fL   MCH 27.2 26.6 - 33.0 pg   MCHC 32.0 31.5 - 35.7 g/dL   RDW 12.7 11.7 - 15.4 %   Neutrophils 72 Not Estab. %   Lymphs 17 Not Estab. %   Monocytes 5 Not Estab. %   Eos 4 Not Estab. %   Basos 1 Not Estab. %   Neutrophils Absolute 6.0 1.4 - 7.0 x10E3/uL   Lymphocytes Absolute 1.4 0.7 - 3.1 x10E3/uL   Monocytes Absolute 0.4 0.1 - 0.9 x10E3/uL   EOS (ABSOLUTE) 0.3 0.0 - 0.4 x10E3/uL   Basophils Absolute 0.1 0.0 - 0.2 x10E3/uL   Immature Granulocytes 1 Not Estab. %   Immature Grans (Abs) 0.1 0.0 - 0.1 x10E3/uL  CMP14+EGFR     Status: Abnormal   Collection Time: 03/08/23  2:23 PM  Result Value Ref Range   Glucose 542 (HH) 70 - 99 mg/dL    Comment: **Verified by repeat analysis**   BUN 17 6 - 20 mg/dL   Creatinine, Ser 0.98 0.57 - 1.00 mg/dL   eGFR 79 >59 mL/min/1.73   BUN/Creatinine Ratio 17 9 - 23   Sodium 130 (L) 134 - 144 mmol/L   Potassium 4.5 3.5 - 5.2 mmol/L   Chloride 94 (L) 96 - 106 mmol/L    CO2 21 20 - 29 mmol/L   Calcium 8.7 8.7 - 10.2 mg/dL   Total Protein 6.3 6.0 - 8.5 g/dL   Albumin 3.3 (L) 3.9 - 4.9 g/dL   Globulin, Total 3.0 1.5 - 4.5 g/dL   Albumin/Globulin Ratio 1.1 (L) 1.2 - 2.2   Bilirubin Total 0.2 0.0 - 1.2 mg/dL   Alkaline Phosphatase 137 (H) 44 - 121 IU/L   AST 14 0 - 40 IU/L   ALT 16 0 - 32 IU/L  TSH     Status: None   Collection Time: 03/08/23  2:23 PM  Result Value Ref Range   TSH 1.430 0.450 - 4.500 uIU/mL  Hemoglobin A1c     Status: Abnormal  Collection Time: 03/08/23  2:23 PM  Result Value Ref Range   Hgb A1c MFr Bld 14.6 (H) 4.8 - 5.6 %    Comment:          Prediabetes: 5.7 - 6.4          Diabetes: >6.4          Glycemic control for adults with diabetes: <7.0    Est. average glucose Bld gHb Est-mCnc 372 mg/dL  Vitamin B12     Status: None   Collection Time: 03/08/23  2:23 PM  Result Value Ref Range   Vitamin B-12 355 232 - 1,245 pg/mL  POC CREATINE & ALBUMIN,URINE     Status: Abnormal   Collection Time: 03/08/23  2:30 PM  Result Value Ref Range   Microalbumin Ur, POC 150 mg/L   Creatinine, POC 50 mg/dL   Albumin/Creatinine Ratio, Urine, POC >300   POCT Urinalysis Dipstick [id number])     Status: Abnormal   Collection Time: 03/08/23  2:30 PM  Result Value Ref Range   Color, UA     Clarity, UA Cloudy    Glucose, UA Positive (A) Negative    Comment: 500mg /DL   Bilirubin, UA Negative    Ketones, UA Negative    Spec Grav, UA 1.015 1.010 - 1.025   Blood, UA Moderate    pH, UA 5.0 5.0 - 8.0   Protein, UA Positive (A) Negative    Comment: >=300mg /dl   Urobilinogen, UA 0.2 0.2 or 1.0 E.U./dL   Nitrite, UA Positive    Leukocytes, UA Negative Negative   Appearance     Odor        Assessment & Plan:   Problem List Items Addressed This Visit     Acquired absence of other right toe(s) ([place])   Type 2 diabetes mellitus with circulatory disorder, with long-term current use of insulin ([place]) - Primary   Relevant Orders   CBC With  Differential (Completed)   CMP14+EGFR (Completed)   Hemoglobin A1c (Completed)   POC CREATINE & ALBUMIN,URINE (Completed)   Obesity (BMI 30-39.9)   Relevant Orders   CBC With Differential (Completed)   CMP14+EGFR (Completed)   Primary hypertension   Relevant Orders   CBC With Differential (Completed)   CMP14+EGFR (Completed)   Morbid obesity ([place])   Other Visit Diagnoses     B12 deficiency due to diet       Relevant Orders   CBC With Differential (Completed)   CMP14+EGFR (Completed)   Vitamin B12 (Completed)   Vitamin D deficiency, unspecified       Relevant Orders   VITAMIN D 25 Hydroxy (Vit-D Deficiency, Fractures) (Completed)   CBC With Differential (Completed)   CMP14+EGFR (Completed)   Mixed hyperlipidemia       Relevant Orders   Lipid panel (Completed)   CBC With Differential (Completed)   CMP14+EGFR (Completed)   Other fatigue       Relevant Orders   TSH (Completed)   POCT Urinalysis Dipstick (81002) (Completed)   Culture, Urine   Localized edema       Relevant Orders   PCV ECHOCARDIOGRAM COMPLETE     We are getting labs today, will assess and decide what we need to do for her diabetes when these are back.  I am also getting an echo for her, given the diabetes and her severe leg swelling. I am somewhat concerned for heart failure with her, especially as she has not been taking her medications.   Return in  about 2 weeks (around 03/22/2023) for F/U.   Total time spent: 30 minutes  Mechele Claude, FNP  03/08/2023

## 2023-03-09 ENCOUNTER — Telehealth: Payer: Self-pay

## 2023-03-09 NOTE — Telephone Encounter (Signed)
Santiago Glad with [facility] called and left vm regarding critical labs on pt, requesting a call back. Reference number: [id number]

## 2023-03-10 ENCOUNTER — Encounter: Payer: Self-pay | Admitting: Family

## 2023-03-11 LAB — CMP14+EGFR
ALT: 16 IU/L (ref 0–32)
AST: 14 IU/L (ref 0–40)
Albumin/Globulin Ratio: 1.1 — ABNORMAL LOW (ref 1.2–2.2)
Albumin: 3.3 g/dL — ABNORMAL LOW (ref 3.9–4.9)
Alkaline Phosphatase: 137 IU/L — ABNORMAL HIGH (ref 44–121)
BUN/Creatinine Ratio: 17 (ref 9–23)
BUN: 17 mg/dL (ref 6–20)
Bilirubin Total: 0.2 mg/dL (ref 0.0–1.2)
CO2: 21 mmol/L (ref 20–29)
Calcium: 8.7 mg/dL (ref 8.7–10.2)
Chloride: 94 mmol/L — ABNORMAL LOW (ref 96–106)
Creatinine, Ser: 0.98 mg/dL (ref 0.57–1.00)
Globulin, Total: 3 g/dL (ref 1.5–4.5)
Glucose: 542 mg/dL (ref 70–99)
Potassium: 4.5 mmol/L (ref 3.5–5.2)
Sodium: 130 mmol/L — ABNORMAL LOW (ref 134–144)
Total Protein: 6.3 g/dL (ref 6.0–8.5)
eGFR: 79 mL/min/{1.73_m2} (ref >59)

## 2023-03-11 LAB — CBC WITH DIFFERENTIAL
Basophils Absolute: 0.1 10*3/uL (ref 0.0–0.2)
Basos: 1 %
EOS (ABSOLUTE): 0.3 10*3/uL (ref 0.0–0.4)
Eos: 4 %
Hematocrit: 37.2 % (ref 34.0–46.6)
Hemoglobin: 11.9 g/dL (ref 11.1–15.9)
Immature Grans (Abs): 0.1 10*3/uL (ref 0.0–0.1)
Immature Granulocytes: 1 %
Lymphocytes Absolute: 1.4 10*3/uL (ref 0.7–3.1)
Lymphs: 17 %
MCH: 27.2 pg (ref 26.6–33.0)
MCHC: 32 g/dL (ref 31.5–35.7)
MCV: 85 fL (ref 79–97)
Monocytes Absolute: 0.4 10*3/uL (ref 0.1–0.9)
Monocytes: 5 %
Neutrophils Absolute: 6 10*3/uL (ref 1.4–7.0)
Neutrophils: 72 %
RBC: 4.38 x10E6/uL (ref 3.77–5.28)
RDW: 12.7 % (ref 11.7–15.4)
WBC: 8.2 10*3/uL (ref 3.4–10.8)

## 2023-03-11 LAB — VITAMIN B12: Vitamin B-12: 355 pg/mL (ref 232–1245)

## 2023-03-11 LAB — LIPID PANEL
Chol/HDL Ratio: 6.6 ratio — ABNORMAL HIGH (ref 0.0–4.4)
Cholesterol, Total: 269 mg/dL — ABNORMAL HIGH (ref 100–199)
HDL: 41 mg/dL (ref >39)
LDL Chol Calc (NIH): 179 mg/dL — ABNORMAL HIGH (ref 0–99)
Triglycerides: 256 mg/dL — ABNORMAL HIGH (ref 0–149)
VLDL Cholesterol Cal: 49 mg/dL — ABNORMAL HIGH (ref 5–40)

## 2023-03-11 LAB — TSH: TSH: 1.43 u[IU]/mL (ref 0.450–4.500)

## 2023-03-11 LAB — HEMOGLOBIN A1C
Est. average glucose Bld gHb Est-mCnc: 372 mg/dL
Hgb A1c MFr Bld: 14.6 % — ABNORMAL HIGH (ref 4.8–5.6)

## 2023-03-11 LAB — VITAMIN D 25 HYDROXY (VIT D DEFICIENCY, FRACTURES): Vit D, 25-Hydroxy: 5.1 ng/mL — ABNORMAL LOW (ref 30.0–100.0)

## 2023-03-12 ENCOUNTER — Telehealth: Payer: Self-pay

## 2023-03-12 LAB — URINE CULTURE

## 2023-03-12 MED ORDER — BUSPIRONE HCL 5 MG PO TABS: 5.0000 mg | ORAL_TABLET | Freq: Three times a day (TID) | ORAL | 1 refills | Status: DC

## 2023-03-12 NOTE — Telephone Encounter (Signed)
Pt called and left vm regarding discussed at appt about rx that was supposed to be sent but said nothing was at the pharmacy, please advise

## 2023-03-15 ENCOUNTER — Ambulatory Visit: Payer: Managed Care, Other (non HMO)

## 2023-03-15 DIAGNOSIS — I340 Nonrheumatic mitral (valve) insufficiency: Secondary | ICD-10-CM | POA: Diagnosis not present

## 2023-03-15 DIAGNOSIS — I351 Nonrheumatic aortic (valve) insufficiency: Secondary | ICD-10-CM

## 2023-03-15 DIAGNOSIS — I361 Nonrheumatic tricuspid (valve) insufficiency: Secondary | ICD-10-CM | POA: Diagnosis not present

## 2023-03-15 DIAGNOSIS — R600 Localized edema: Secondary | ICD-10-CM

## 2023-03-22 ENCOUNTER — Encounter: Payer: Self-pay | Admitting: Family

## 2023-03-22 ENCOUNTER — Ambulatory Visit: Payer: Managed Care, Other (non HMO) | Admitting: Family

## 2023-03-22 VITALS — BP 168/130 | HR 101 | Ht 75.0 in | Wt 304.4 lb

## 2023-03-22 DIAGNOSIS — E559 Vitamin D deficiency, unspecified: Secondary | ICD-10-CM

## 2023-03-22 DIAGNOSIS — E782 Mixed hyperlipidemia: Secondary | ICD-10-CM | POA: Diagnosis not present

## 2023-03-22 DIAGNOSIS — Z794 Long term (current) use of insulin: Secondary | ICD-10-CM

## 2023-03-22 DIAGNOSIS — E1142 Type 2 diabetes mellitus with diabetic polyneuropathy: Secondary | ICD-10-CM | POA: Diagnosis not present

## 2023-03-22 DIAGNOSIS — F411 Generalized anxiety disorder: Secondary | ICD-10-CM

## 2023-03-22 DIAGNOSIS — E1159 Type 2 diabetes mellitus with other circulatory complications: Secondary | ICD-10-CM | POA: Diagnosis not present

## 2023-03-22 DIAGNOSIS — R600 Localized edema: Secondary | ICD-10-CM

## 2023-03-22 MED ORDER — VITAMIN D (ERGOCALCIFEROL) 1.25 MG (50000 UNIT) PO CAPS: 50000.0000 [IU] | ORAL_CAPSULE | ORAL | 1 refills | Status: DC

## 2023-03-22 MED ORDER — ROSUVASTATIN CALCIUM 5 MG PO TABS
5.0000 mg | ORAL_TABLET | Freq: Every day | ORAL | 0 refills | Status: DC
Start: 2023-03-22 — End: 2024-05-09

## 2023-03-22 MED ORDER — HYDROCHLOROTHIAZIDE 12.5 MG PO TABS
12.5000 mg | ORAL_TABLET | Freq: Every day | ORAL | 1 refills | Status: AC
Start: 2023-03-22 — End: ?

## 2023-03-22 MED ORDER — DULOXETINE HCL 20 MG PO CPEP: 20.0000 mg | ORAL_CAPSULE | Freq: Every day | ORAL | 0 refills | Status: AC

## 2023-03-24 DIAGNOSIS — E782 Mixed hyperlipidemia: Secondary | ICD-10-CM | POA: Insufficient documentation

## 2023-03-24 NOTE — Assessment & Plan Note (Signed)
Starting patient on rosuvastatin, low dose.  Will let me know if she has side effects from this.

## 2023-03-24 NOTE — Assessment & Plan Note (Signed)
Adding Duloxetine to her medications, to help with both her anxiety and neuropathy.

## 2023-03-24 NOTE — Assessment & Plan Note (Signed)
Starting patient on mounjaro.  I have cautioned her re: the possibility of this aggravating her gastroparesis.  We have discussed that she needs to stop the medication if she starts having any issues.

## 2023-03-24 NOTE — Progress Notes (Signed)
Established Patient Office Visit  Subjective:  Patient ID: Katie Gilbert, female    DOB: [birth year]  Age: 32 y.o. MRN: [id number]  Chief Complaint  Patient presents with   Follow-up    2 week follow up    Patient is here today for her 2 week follow-up.  She had labs drawn at that time so we will discuss these today.  She also had her echo done since then, so we will review these results as well.   Her A1C is very elevated, cholesterol was very high as well, and her Vitamin D is quite low.   She continues to have significant swelling in her legs, especially after sitting for extended periods of time.    Doing well otherwise, says that the buspar has been helping some, but that it isn't completely resolving her issue.   No other concerns at this time.   Past Medical History:  Diagnosis Date   C. difficile enteritis 07/24/2021   Diarrhea 07/22/2021   Genital herpes    Nausea & vomiting 07/22/2021   Type II diabetes mellitus with renal manifestations     Past Surgical History:  Procedure Laterality Date   FOOT SURGERY Right    I & D EXTREMITY Right 01/17/2019   Procedure: IRRIGATION AND DEBRIDEMENT RIGHT FOOT;  Surgeon: Gwyneth Revels, DPM;  Location: [facility] ORS;  Service: Podiatry;  Laterality: Right;    Social History   Socioeconomic History   Marital status: Single    Spouse name: Not on file   Number of children: Not on file   Years of education: Not on file   Highest education level: Not on file  Occupational History   Not on file  Tobacco Use   Smoking status: Light Smoker    Packs/day: .2    Types: Cigarettes   Smokeless tobacco: Never  Vaping Use   Vaping Use: Never used  Substance and Sexual Activity   Alcohol use: No   Drug use: No   Sexual activity: Not on file  Other Topics Concern   Not on file  Social History Narrative   Not on file   Social Determinants of Health   Financial Resource Strain: Not on file  Food Insecurity: Not on file   Transportation Needs: Not on file  Physical Activity: Not on file  Stress: Not on file  Social Connections: Not on file  Intimate Partner Violence: Not on file    Family History  Problem Relation Age of Onset   Hypertension Father    Heart attack Father    Diabetes Father    Diabetes Maternal Grandmother     Allergies  Allergen Reactions   Haloperidol Other (See Comments)    Dystonic reaction  Neck spasms    Review of Systems  Cardiovascular:  Positive for leg swelling.  All other systems reviewed and are negative.      Objective:   BP (!) 168/130   Pulse (!) 101   Ht 6\' 3"  (1.905 m)   Wt (!) 304 lb 6.4 oz (138.1 kg)   SpO2 97%   BMI 38.05 kg/m   Vitals:   03/22/23 1414  BP: (!) 168/130  Pulse: (!) 101  Height: 6\' 3"  (1.905 m)  Weight: (!) 304 lb 6.4 oz (138.1 kg)  SpO2: 97%  BMI (Calculated): 38.05    Physical Exam Vitals and nursing note reviewed.  Constitutional:      Appearance: Normal appearance. She is normal weight.  HENT:  Head: Normocephalic.  Eyes:     Pupils: Pupils are equal, round, and reactive to light.  Cardiovascular:     Rate and Rhythm: Normal rate and regular rhythm.     Pulses: Normal pulses.     Heart sounds: Normal heart sounds.  Pulmonary:     Effort: Pulmonary effort is normal.     Breath sounds: Normal breath sounds.  Musculoskeletal:        General: Swelling present.     Right lower leg: Edema present.     Left lower leg: Edema present.  Neurological:     General: No focal deficit present.     Mental Status: She is alert and oriented to person, place, and time.     Sensory: Sensory deficit (BL feet.) present.  Psychiatric:        Mood and Affect: Mood normal.        Behavior: Behavior normal.        Thought Content: Thought content normal.        Judgment: Judgment normal.      Assessment & Plan:   Problem List Items Addressed This Visit     Type 2 diabetes mellitus with circulatory disorder, with  long-term current use of insulin - Primary    Starting patient on mounjaro.  I have cautioned her re: the possibility of this aggravating her gastroparesis.  We have discussed that she needs to stop the medication if she starts having any issues.       Relevant Medications   hydrochlorothiazide (HYDRODIURIL) 12.5 MG tablet   rosuvastatin (CRESTOR) 5 MG tablet   tirzepatide (MOUNJARO) 2.5 MG/0.5ML Pen   Generalized anxiety disorder    Adding Duloxetine to her medications, to help with both her anxiety and neuropathy.       Relevant Medications   DULoxetine (CYMBALTA) 20 MG capsule   Mixed hyperlipidemia    Starting patient on rosuvastatin, low dose.  Will let me know if she has side effects from this.       Relevant Medications   hydrochlorothiazide (HYDRODIURIL) 12.5 MG tablet   rosuvastatin (CRESTOR) 5 MG tablet   Other Visit Diagnoses     Vitamin D deficiency, unspecified       Relevant Medications   Vitamin D, Ergocalciferol, (DRISDOL) 1.25 MG (50000 UNIT) CAPS capsule   Diabetic polyneuropathy associated with type 2 diabetes mellitus       Relevant Medications   DULoxetine (CYMBALTA) 20 MG capsule   rosuvastatin (CRESTOR) 5 MG tablet   tirzepatide (MOUNJARO) 2.5 MG/0.5ML Pen   Localized edema       sending HCTZ to try with for her edema.   Relevant Medications   hydrochlorothiazide (HYDRODIURIL) 12.5 MG tablet       Return in about 1 month (around 04/21/2023) for F/U.   Total time spent: 30 minutes  Miki Kins, FNP  03/22/2023

## 2023-04-13 ENCOUNTER — Ambulatory Visit: Payer: Managed Care, Other (non HMO) | Admitting: Family

## 2023-04-13 VITALS — BP 122/70 | HR 118 | Ht 75.0 in | Wt 304.0 lb

## 2023-04-13 DIAGNOSIS — E860 Dehydration: Secondary | ICD-10-CM | POA: Diagnosis not present

## 2023-04-13 DIAGNOSIS — E1143 Type 2 diabetes mellitus with diabetic autonomic (poly)neuropathy: Secondary | ICD-10-CM

## 2023-04-13 DIAGNOSIS — Z794 Long term (current) use of insulin: Secondary | ICD-10-CM

## 2023-04-13 DIAGNOSIS — E1159 Type 2 diabetes mellitus with other circulatory complications: Secondary | ICD-10-CM | POA: Diagnosis not present

## 2023-04-13 DIAGNOSIS — K3184 Gastroparesis: Secondary | ICD-10-CM

## 2023-04-13 LAB — POCT CBG (FASTING - GLUCOSE)-MANUAL ENTRY: Glucose Fasting, POC: 497 mg/dL — AB (ref 70–99)

## 2023-04-18 ENCOUNTER — Encounter: Payer: Self-pay | Admitting: Family

## 2023-04-18 NOTE — Progress Notes (Signed)
Established Patient Office Visit  Subjective:  Patient ID: Katie Gilbert, female    DOB: 23-Apr-1991  Age: 32 y.o. MRN: [id number]  Chief Complaint  Patient presents with   Follow-up    Weak, fatigue, loss of appetite    Patient is here for 2 days of nausea/vomiting/weakness.   She says that she is feeling dizzy, tired.  Has had some issues with this before.   She does have a history of gastroparesis.  She has not started the [facility] yet.   No other concerns at this time.   Past Medical History:  Diagnosis Date   C. difficile enteritis 07/24/2021   Diarrhea 07/22/2021   Genital herpes    Nausea & vomiting 07/22/2021   Type II diabetes mellitus with renal manifestations [facility])     Past Surgical History:  Procedure Laterality Date   FOOT SURGERY Right    I & D EXTREMITY Right 01/17/2019   Procedure: IRRIGATION AND DEBRIDEMENT RIGHT FOOT;  Surgeon: Gwyneth Revels, DPM;  Location: [facility] ORS;  Service: Podiatry;  Laterality: Right;    Social History   Socioeconomic History   Marital status: Single    Spouse name: Not on file   Number of children: Not on file   Years of education: Not on file   Highest education level: Not on file  Occupational History   Not on file  Tobacco Use   Smoking status: Light Smoker    Packs/day: .2    Types: Cigarettes   Smokeless tobacco: Never  Vaping Use   Vaping Use: Never used  Substance and Sexual Activity   Alcohol use: No   Drug use: No   Sexual activity: Not on file  Other Topics Concern   Not on file  Social History Narrative   Not on file   Social Determinants of Health   Financial Resource Strain: Not on file  Food Insecurity: Not on file  Transportation Needs: Not on file  Physical Activity: Not on file  Stress: Not on file  Social Connections: Not on file  Intimate Partner Violence: Not on file    Family History  Problem Relation Age of Onset   Hypertension Father    Heart attack Father    Diabetes  Father    Diabetes Maternal Grandmother     Allergies  Allergen Reactions   Haloperidol Other (See Comments)    Dystonic reaction  Neck spasms    Review of Systems  Constitutional:  Positive for malaise/fatigue.  Gastrointestinal:  Positive for abdominal pain, nausea and vomiting.  Neurological:  Positive for dizziness.  All other systems reviewed and are negative.      Objective:   BP 122/70   Pulse (!) 118   Ht 6\' 3"  (1.905 m)   Wt (!) 304 lb (137.9 kg)   SpO2 96%   BMI 38.00 kg/m   Vitals:   04/13/23 1501  BP: 122/70  Pulse: (!) 118  Height: 6\' 3"  (1.905 m)  Weight: (!) 304 lb (137.9 kg)  SpO2: 96%  BMI (Calculated): 38    Physical Exam Vitals and nursing note reviewed.  Constitutional:      General: She is not in acute distress.    Appearance: Normal appearance. She is normal weight. She is ill-appearing. She is not toxic-appearing.  HENT:     Head: Normocephalic.  Eyes:     Extraocular Movements: Extraocular movements intact.     Conjunctiva/sclera: Conjunctivae normal.     Pupils: Pupils are equal,  round, and reactive to light.  Cardiovascular:     Rate and Rhythm: Normal rate.  Pulmonary:     Effort: Pulmonary effort is normal.  Neurological:     General: No focal deficit present.     Mental Status: She is alert and oriented to person, place, and time. Mental status is at baseline.  Psychiatric:        Mood and Affect: Mood normal.        Behavior: Behavior normal.        Thought Content: Thought content normal.        Judgment: Judgment normal.      Results for orders placed or performed in visit on 04/13/23  POCT CBG (Fasting - Glucose)  Result Value Ref Range   Glucose Fasting, POC 497 (A) 70 - 99 mg/dL    Recent Results (from the past 2160 hour(s))  Lipid panel     Status: Abnormal   Collection Time: 03/08/23  2:23 PM  Result Value Ref Range   Cholesterol, Total 269 (H) 100 - 199 mg/dL   Triglycerides 960 (H) 0 - 149 mg/dL    HDL 41 >45 mg/dL   VLDL Cholesterol Cal 49 (H) 5 - 40 mg/dL   LDL Chol Calc (NIH) 409 (H) 0 - 99 mg/dL   Chol/HDL Ratio 6.6 (H) 0.0 - 4.4 ratio    Comment:                                   T. Chol/HDL Ratio                                             Men  Women                               1/2 Avg.Risk  3.4    3.3                                   Avg.Risk  5.0    4.4                                2X Avg.Risk  9.6    7.1                                3X Avg.Risk 23.4   11.0   VITAMIN D 25 Hydroxy (Vit-D Deficiency, Fractures)     Status: Abnormal   Collection Time: 03/08/23  2:23 PM  Result Value Ref Range   Vit D, 25-Hydroxy 5.1 (L) 30.0 - 100.0 ng/mL    Comment: Vitamin D deficiency has been defined by the Institute of Medicine and an Endocrine Society practice guideline as a level of serum 25-OH vitamin D less than 20 ng/mL (1,2). The Endocrine Society went on to further define vitamin D insufficiency as a level between 21 and 29 ng/mL (2). 1. IOM (Institute of Medicine). 2010. Dietary reference    intakes for calcium and D. [place]: The    [employer]. 2. Holick MF, Binkley [place], Bischoff-Ferrari  HA, et al.    Evaluation, treatment, and prevention of vitamin D    deficiency: an Endocrine Society clinical practice    guideline. JCEM. 2011 Jul; 96(7):1911-30.   CBC With Differential     Status: None   Collection Time: 03/08/23  2:23 PM  Result Value Ref Range   WBC 8.2 3.4 - 10.8 x10E3/uL   RBC 4.38 3.77 - 5.28 x10E6/uL   Hemoglobin 11.9 11.1 - 15.9 g/dL   Hematocrit 16.1 09.6 - 46.6 %   MCV 85 79 - 97 fL   MCH 27.2 26.6 - 33.0 pg   MCHC 32.0 31.5 - 35.7 g/dL   RDW 04.5 40.9 - 81.1 %   Neutrophils 72 Not Estab. %   Lymphs 17 Not Estab. %   Monocytes 5 Not Estab. %   Eos 4 Not Estab. %   Basos 1 Not Estab. %   Neutrophils Absolute 6.0 1.4 - 7.0 x10E3/uL   Lymphocytes Absolute 1.4 0.7 - 3.1 x10E3/uL   Monocytes Absolute 0.4 0.1 - 0.9 x10E3/uL   EOS  (ABSOLUTE) 0.3 0.0 - 0.4 x10E3/uL   Basophils Absolute 0.1 0.0 - 0.2 x10E3/uL   Immature Granulocytes 1 Not Estab. %   Immature Grans (Abs) 0.1 0.0 - 0.1 x10E3/uL  CMP14+EGFR     Status: Abnormal   Collection Time: 03/08/23  2:23 PM  Result Value Ref Range   Glucose 542 (HH) 70 - 99 mg/dL    Comment: **Verified by repeat analysis**   BUN 17 6 - 20 mg/dL   Creatinine, Ser 9.14 0.57 - 1.00 mg/dL   eGFR 79 >78 GN/FAO/1.30   BUN/Creatinine Ratio 17 9 - 23   Sodium 130 (L) 134 - 144 mmol/L   Potassium 4.5 3.5 - 5.2 mmol/L   Chloride 94 (L) 96 - 106 mmol/L   CO2 21 20 - 29 mmol/L   Calcium 8.7 8.7 - 10.2 mg/dL   Total Protein 6.3 6.0 - 8.5 g/dL   Albumin 3.3 (L) 3.9 - 4.9 g/dL   Globulin, Total 3.0 1.5 - 4.5 g/dL   Albumin/Globulin Ratio 1.1 (L) 1.2 - 2.2   Bilirubin Total 0.2 0.0 - 1.2 mg/dL   Alkaline Phosphatase 137 (H) 44 - 121 IU/L   AST 14 0 - 40 IU/L   ALT 16 0 - 32 IU/L  TSH     Status: None   Collection Time: 03/08/23  2:23 PM  Result Value Ref Range   TSH 1.430 0.450 - 4.500 uIU/mL  Hemoglobin A1c     Status: Abnormal   Collection Time: 03/08/23  2:23 PM  Result Value Ref Range   Hgb A1c MFr Bld 14.6 (H) 4.8 - 5.6 %    Comment:          Prediabetes: 5.7 - 6.4          Diabetes: >6.4          Glycemic control for adults with diabetes: <7.0    Est. average glucose Bld gHb Est-mCnc 372 mg/dL  Vitamin Q65     Status: None   Collection Time: 03/08/23  2:23 PM  Result Value Ref Range   Vitamin B-12 355 232 - 1,245 pg/mL  POC CREATINE & ALBUMIN,URINE     Status: Abnormal   Collection Time: 03/08/23  2:30 PM  Result Value Ref Range   Microalbumin Ur, POC 150 mg/L   Creatinine, POC 50 mg/dL   Albumin/Creatinine Ratio, Urine, POC >300   POCT Urinalysis Dipstick (81002)  Status: Abnormal   Collection Time: 03/08/23  2:30 PM  Result Value Ref Range   Color, UA     Clarity, UA Cloudy    Glucose, UA Positive (A) Negative    Comment: 500mg /DL   Bilirubin, UA Negative     Ketones, UA Negative    Spec Grav, UA 1.015 1.010 - 1.025   Blood, UA Moderate    pH, UA 5.0 5.0 - 8.0   Protein, UA Positive (A) Negative    Comment: >=300mg /dl   Urobilinogen, UA 0.2 0.2 or 1.0 E.U./dL   Nitrite, UA Positive    Leukocytes, UA Negative Negative   Appearance     Odor    Culture, Urine     Status: Abnormal   Collection Time: 03/08/23  2:56 PM   Specimen: Urine   UC  Result Value Ref Range   Urine Culture, Routine Final report (A)    Organism ID, Bacteria Klebsiella pneumoniae (A)     Comment: Cefazolin <=4 ug/mL Cefazolin with an MIC <=16 predicts susceptibility to the oral agents cefaclor, cefdinir, cefpodoxime, cefprozil, cefuroxime, cephalexin, and loracarbef when used for therapy of uncomplicated urinary tract infections due to E. coli, Klebsiella pneumoniae, and Proteus mirabilis. Greater than 100,000 colony forming units per mL    ORGANISM ID, BACTERIA Not applicable    Antimicrobial Susceptibility Comment     Comment:       ** S = Susceptible; I = Intermediate; R = Resistant **                    P = Positive; N = Negative             MICS are expressed in micrograms per mL    Antibiotic                 RSLT#1    RSLT#2    RSLT#3    RSLT#4 Amoxicillin/Clavulanic Acid    S Ampicillin                     R Cefepime                       S Ceftriaxone                    S Cefuroxime                     S Ciprofloxacin                  S Ertapenem                      S Gentamicin                     S Imipenem                       S Levofloxacin                   S Meropenem                      S Nitrofurantoin                 S Piperacillin/Tazobactam        S Tetracycline  S Tobramycin                     S Trimethoprim/Sulfa             S   POCT CBG (Fasting - Glucose)     Status: Abnormal   Collection Time: 04/13/23  3:05 PM  Result Value Ref Range   Glucose Fasting, POC 497 (A) 70 - 99 mg/dL       Assessment &  Plan:   Problem List Items Addressed This Visit       Cardiovascular and Mediastinum   Type 2 diabetes mellitus with circulatory disorder, with long-term current use of insulin (HCC) - Primary   Relevant Orders   POCT CBG (Fasting - Glucose) (Completed)     Digestive   Diabetic gastroparesis (HCC)   Other Visit Diagnoses     Dehydration           Return as previously scheduled., for F/U.   Total time spent: 30 minutes  Miki Kins, FNP  04/13/2023

## 2023-04-23 ENCOUNTER — Encounter: Payer: Self-pay | Admitting: Family

## 2023-04-23 ENCOUNTER — Ambulatory Visit: Payer: Managed Care, Other (non HMO) | Admitting: Family

## 2023-04-23 VITALS — BP 122/68 | HR 105 | Ht 75.0 in | Wt 265.0 lb

## 2023-04-23 DIAGNOSIS — Z794 Long term (current) use of insulin: Secondary | ICD-10-CM | POA: Diagnosis not present

## 2023-04-23 DIAGNOSIS — E1143 Type 2 diabetes mellitus with diabetic autonomic (poly)neuropathy: Secondary | ICD-10-CM | POA: Diagnosis not present

## 2023-04-23 DIAGNOSIS — Z89421 Acquired absence of other right toe(s): Secondary | ICD-10-CM

## 2023-04-23 DIAGNOSIS — E1159 Type 2 diabetes mellitus with other circulatory complications: Secondary | ICD-10-CM

## 2023-04-23 DIAGNOSIS — K3184 Gastroparesis: Secondary | ICD-10-CM

## 2023-04-23 LAB — POCT CBG (FASTING - GLUCOSE)-MANUAL ENTRY: Glucose Fasting, POC: 377 mg/dL — AB (ref 70–99)

## 2023-04-23 MED ORDER — METOCLOPRAMIDE HCL 10 MG PO TABS: 10.0000 mg | ORAL_TABLET | Freq: Three times a day (TID) | ORAL | 1 refills | Status: DC

## 2023-04-23 MED ORDER — PROCHLORPERAZINE MALEATE 5 MG PO TABS: 5.0000 mg | ORAL_TABLET | Freq: Four times a day (QID) | ORAL | 0 refills | Status: DC | PRN

## 2023-04-23 NOTE — Assessment & Plan Note (Signed)
Setting up referral for pt to gastroparesis specialist in [place].   She understands that she will have to drive, but she is willing to do so.

## 2023-04-23 NOTE — Progress Notes (Signed)
Established Patient Office Visit  Subjective:  Patient ID: Katie Gilbert, female    DOB: 1991-06-26  Age: 32 y.o. MRN: [id number]  Chief Complaint  Patient presents with   Follow-up    1 month follow up    Patient is here today for her 2 week follow up.  She is feeling very poorly, needs something to help with her nausea/vomiting.   No other concerns today.    Past Medical History:  Diagnosis Date   C. difficile enteritis 07/24/2021   Diarrhea 07/22/2021   Genital herpes    Nausea & vomiting 07/22/2021   Type II diabetes mellitus with renal manifestations [facility])     Past Surgical History:  Procedure Laterality Date   FOOT SURGERY Right    I & D EXTREMITY Right 01/17/2019   Procedure: IRRIGATION AND DEBRIDEMENT RIGHT FOOT;  Surgeon: Gwyneth Revels, DPM;  Location: [facility] ORS;  Service: Podiatry;  Laterality: Right;    Social History   Socioeconomic History   Marital status: Single    Spouse name: Not on file   Number of children: Not on file   Years of education: Not on file   Highest education level: Not on file  Occupational History   Not on file  Tobacco Use   Smoking status: Light Smoker    Packs/day: .2    Types: Cigarettes   Smokeless tobacco: Never  Vaping Use   Vaping Use: Never used  Substance and Sexual Activity   Alcohol use: No   Drug use: No   Sexual activity: Not on file  Other Topics Concern   Not on file  Social History Narrative   Not on file   Social Determinants of Health   Financial Resource Strain: Not on file  Food Insecurity: Not on file  Transportation Needs: Not on file  Physical Activity: Not on file  Stress: Not on file  Social Connections: Not on file  Intimate Partner Violence: Not on file    Family History  Problem Relation Age of Onset   Hypertension Father    Heart attack Father    Diabetes Father    Diabetes Maternal Grandmother     Allergies  Allergen Reactions   Haloperidol Other (See Comments)     Dystonic reaction  Neck spasms    Review of Systems  Constitutional:  Positive for malaise/fatigue.  Gastrointestinal:  Positive for abdominal pain, constipation, nausea and vomiting.  Neurological:  Positive for dizziness, weakness and headaches.  All other systems reviewed and are negative.      Objective:   BP 122/68   Pulse (!) 105   Ht 6\' 3"  (1.905 m)   Wt 265 lb (120.2 kg)   SpO2 99%   BMI 33.12 kg/m   Vitals:   04/23/23 1529  BP: 122/68  Pulse: (!) 105  Height: 6\' 3"  (1.905 m)  Weight: 265 lb (120.2 kg)  SpO2: 99%  BMI (Calculated): 33.12    Physical Exam Vitals and nursing note reviewed.  Constitutional:      General: She is not in acute distress.    Appearance: Normal appearance. She is obese. She is ill-appearing. She is not toxic-appearing.  HENT:     Head: Normocephalic.  Eyes:     Pupils: Pupils are equal, round, and reactive to light.  Cardiovascular:     Rate and Rhythm: Normal rate.  Pulmonary:     Effort: Pulmonary effort is normal.  Abdominal:     General: Bowel sounds are  decreased.     Tenderness: There is abdominal tenderness.  Neurological:     General: No focal deficit present.     Mental Status: She is alert and oriented to person, place, and time.     Motor: Weakness present.  Psychiatric:        Attention and Perception: Attention normal.        Mood and Affect: Mood is anxious and depressed.        Speech: Speech normal.        Behavior: Behavior normal. Behavior is cooperative.      Results for orders placed or performed in visit on 04/23/23  POCT CBG (Fasting - Glucose)  Result Value Ref Range   Glucose Fasting, POC 377 (A) 70 - 99 mg/dL    Recent Results (from the past 2160 hour(s))  Lipid panel     Status: Abnormal   Collection Time: 03/08/23  2:23 PM  Result Value Ref Range   Cholesterol, Total 269 (H) 100 - 199 mg/dL   Triglycerides 098 (H) 0 - 149 mg/dL   HDL 41 >11 mg/dL   VLDL Cholesterol Cal 49 (H) 5 - 40  mg/dL   LDL Chol Calc (NIH) 914 (H) 0 - 99 mg/dL   Chol/HDL Ratio 6.6 (H) 0.0 - 4.4 ratio    Comment:                                   T. Chol/HDL Ratio                                             Men  Women                               1/2 Avg.Risk  3.4    3.3                                   Avg.Risk  5.0    4.4                                2X Avg.Risk  9.6    7.1                                3X Avg.Risk 23.4   11.0   VITAMIN D 25 Hydroxy (Vit-D Deficiency, Fractures)     Status: Abnormal   Collection Time: 03/08/23  2:23 PM  Result Value Ref Range   Vit D, 25-Hydroxy 5.1 (L) 30.0 - 100.0 ng/mL    Comment: Vitamin D deficiency has been defined by the Institute of Medicine and an Endocrine Society practice guideline as a level of serum 25-OH vitamin D less than 20 ng/mL (1,2). The Endocrine Society went on to further define vitamin D insufficiency as a level between 21 and 29 ng/mL (2). 1. IOM (Institute of Medicine). 2010. Dietary reference    intakes for calcium and D. [place]: The    [employer]. 2. Holick MF, Binkley [place], Bischoff-Ferrari HA, et al.    Evaluation, treatment, and  prevention of vitamin D    deficiency: an Endocrine Society clinical practice    guideline. JCEM. 2011 Jul; 96(7):1911-30.   CBC With Differential     Status: None   Collection Time: 03/08/23  2:23 PM  Result Value Ref Range   WBC 8.2 3.4 - 10.8 x10E3/uL   RBC 4.38 3.77 - 5.28 x10E6/uL   Hemoglobin 11.9 11.1 - 15.9 g/dL   Hematocrit 45.4 09.8 - 46.6 %   MCV 85 79 - 97 fL   MCH 27.2 26.6 - 33.0 pg   MCHC 32.0 31.5 - 35.7 g/dL   RDW 11.9 14.7 - 82.9 %   Neutrophils 72 Not Estab. %   Lymphs 17 Not Estab. %   Monocytes 5 Not Estab. %   Eos 4 Not Estab. %   Basos 1 Not Estab. %   Neutrophils Absolute 6.0 1.4 - 7.0 x10E3/uL   Lymphocytes Absolute 1.4 0.7 - 3.1 x10E3/uL   Monocytes Absolute 0.4 0.1 - 0.9 x10E3/uL   EOS (ABSOLUTE) 0.3 0.0 - 0.4 x10E3/uL   Basophils Absolute  0.1 0.0 - 0.2 x10E3/uL   Immature Granulocytes 1 Not Estab. %   Immature Grans (Abs) 0.1 0.0 - 0.1 x10E3/uL  CMP14+EGFR     Status: Abnormal   Collection Time: 03/08/23  2:23 PM  Result Value Ref Range   Glucose 542 (HH) 70 - 99 mg/dL    Comment: **Verified by repeat analysis**   BUN 17 6 - 20 mg/dL   Creatinine, Ser 5.62 0.57 - 1.00 mg/dL   eGFR 79 >13 YQ/MVH/8.46   BUN/Creatinine Ratio 17 9 - 23   Sodium 130 (L) 134 - 144 mmol/L   Potassium 4.5 3.5 - 5.2 mmol/L   Chloride 94 (L) 96 - 106 mmol/L   CO2 21 20 - 29 mmol/L   Calcium 8.7 8.7 - 10.2 mg/dL   Total Protein 6.3 6.0 - 8.5 g/dL   Albumin 3.3 (L) 3.9 - 4.9 g/dL   Globulin, Total 3.0 1.5 - 4.5 g/dL   Albumin/Globulin Ratio 1.1 (L) 1.2 - 2.2   Bilirubin Total 0.2 0.0 - 1.2 mg/dL   Alkaline Phosphatase 137 (H) 44 - 121 IU/L   AST 14 0 - 40 IU/L   ALT 16 0 - 32 IU/L  TSH     Status: None   Collection Time: 03/08/23  2:23 PM  Result Value Ref Range   TSH 1.430 0.450 - 4.500 uIU/mL  Hemoglobin A1c     Status: Abnormal   Collection Time: 03/08/23  2:23 PM  Result Value Ref Range   Hgb A1c MFr Bld 14.6 (H) 4.8 - 5.6 %    Comment:          Prediabetes: 5.7 - 6.4          Diabetes: >6.4          Glycemic control for adults with diabetes: <7.0    Est. average glucose Bld gHb Est-mCnc 372 mg/dL  Vitamin N62     Status: None   Collection Time: 03/08/23  2:23 PM  Result Value Ref Range   Vitamin B-12 355 232 - 1,245 pg/mL  POC CREATINE & ALBUMIN,URINE     Status: Abnormal   Collection Time: 03/08/23  2:30 PM  Result Value Ref Range   Microalbumin Ur, POC 150 mg/L   Creatinine, POC 50 mg/dL   Albumin/Creatinine Ratio, Urine, POC >300   POCT Urinalysis Dipstick (95284)     Status: Abnormal   Collection Time: 03/08/23  2:30 PM  Result Value Ref Range   Color, UA     Clarity, UA Cloudy    Glucose, UA Positive (A) Negative    Comment: 500mg /DL   Bilirubin, UA Negative    Ketones, UA Negative    Spec Grav, UA 1.015 1.010 -  1.025   Blood, UA Moderate    pH, UA 5.0 5.0 - 8.0   Protein, UA Positive (A) Negative    Comment: >=300mg /dl   Urobilinogen, UA 0.2 0.2 or 1.0 E.U./dL   Nitrite, UA Positive    Leukocytes, UA Negative Negative   Appearance     Odor    Culture, Urine     Status: Abnormal   Collection Time: 03/08/23  2:56 PM   Specimen: Urine   UC  Result Value Ref Range   Urine Culture, Routine Final report (A)    Organism ID, Bacteria Klebsiella pneumoniae (A)     Comment: Cefazolin <=4 ug/mL Cefazolin with an MIC <=16 predicts susceptibility to the oral agents cefaclor, cefdinir, cefpodoxime, cefprozil, cefuroxime, cephalexin, and loracarbef when used for therapy of uncomplicated urinary tract infections due to E. coli, Klebsiella pneumoniae, and Proteus mirabilis. Greater than 100,000 colony forming units per mL    ORGANISM ID, BACTERIA Not applicable    Antimicrobial Susceptibility Comment     Comment:       ** S = Susceptible; I = Intermediate; R = Resistant **                    P = Positive; N = Negative             MICS are expressed in micrograms per mL    Antibiotic                 RSLT#1    RSLT#2    RSLT#3    RSLT#4 Amoxicillin/Clavulanic Acid    S Ampicillin                     R Cefepime                       S Ceftriaxone                    S Cefuroxime                     S Ciprofloxacin                  S Ertapenem                      S Gentamicin                     S Imipenem                       S Levofloxacin                   S Meropenem                      S Nitrofurantoin                 S Piperacillin/Tazobactam        S Tetracycline                   S Tobramycin  S Trimethoprim/Sulfa             S   POCT CBG (Fasting - Glucose)     Status: Abnormal   Collection Time: 04/13/23  3:05 PM  Result Value Ref Range   Glucose Fasting, POC 497 (A) 70 - 99 mg/dL  POCT CBG (Fasting - Glucose)     Status: Abnormal   Collection Time: 04/23/23   3:35 PM  Result Value Ref Range   Glucose Fasting, POC 377 (A) 70 - 99 mg/dL       Assessment & Plan:   Problem List Items Addressed This Visit       Cardiovascular and Mediastinum   Type 2 diabetes mellitus with circulatory disorder, with long-term current use of insulin (HCC) - Primary   Relevant Orders   POCT CBG (Fasting - Glucose) (Completed)   Ambulatory referral to Gastroenterology     Digestive   Diabetic gastroparesis associated with type 2 diabetes mellitus (HCC)    Setting up referral for pt to gastroparesis specialist in [place].   She understands that she will have to drive, but she is willing to do so.       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   Will reassess at follow up.       Return will have front desk call her to schedule a follow up..   Total time spent: 30 minutes  Miki Kins, FNP  04/23/2023

## 2023-04-23 NOTE — Assessment & Plan Note (Signed)
Toe is currently well healed, and patient has no current issues with skin integrity.   Will reassess at follow up.

## 2023-10-05 ENCOUNTER — Encounter: Payer: Self-pay | Admitting: Family

## 2023-10-05 ENCOUNTER — Ambulatory Visit: Payer: Managed Care, Other (non HMO) | Admitting: Family

## 2023-10-05 VITALS — BP 160/100 | HR 104 | Ht 75.0 in | Wt 259.4 lb

## 2023-10-05 DIAGNOSIS — Z794 Long term (current) use of insulin: Secondary | ICD-10-CM

## 2023-10-05 DIAGNOSIS — E1143 Type 2 diabetes mellitus with diabetic autonomic (poly)neuropathy: Secondary | ICD-10-CM

## 2023-10-05 DIAGNOSIS — E538 Deficiency of other specified B group vitamins: Secondary | ICD-10-CM

## 2023-10-05 DIAGNOSIS — E1159 Type 2 diabetes mellitus with other circulatory complications: Secondary | ICD-10-CM | POA: Diagnosis not present

## 2023-10-05 DIAGNOSIS — I10 Essential (primary) hypertension: Secondary | ICD-10-CM | POA: Diagnosis not present

## 2023-10-05 DIAGNOSIS — E782 Mixed hyperlipidemia: Secondary | ICD-10-CM

## 2023-10-05 DIAGNOSIS — K3184 Gastroparesis: Secondary | ICD-10-CM

## 2023-10-05 DIAGNOSIS — E860 Dehydration: Secondary | ICD-10-CM

## 2023-10-05 DIAGNOSIS — E669 Obesity, unspecified: Secondary | ICD-10-CM

## 2023-10-05 DIAGNOSIS — R5383 Other fatigue: Secondary | ICD-10-CM

## 2023-10-05 DIAGNOSIS — E559 Vitamin D deficiency, unspecified: Secondary | ICD-10-CM | POA: Diagnosis not present

## 2023-10-05 DIAGNOSIS — Z89421 Acquired absence of other right toe(s): Secondary | ICD-10-CM

## 2023-10-06 LAB — CBC WITH DIFFERENTIAL/PLATELET
Basophils Absolute: 0 10*3/uL (ref 0.0–0.2)
Basos: 1 %
EOS (ABSOLUTE): 0.1 10*3/uL (ref 0.0–0.4)
Eos: 1 %
Hematocrit: 29.4 % — ABNORMAL LOW (ref 34.0–46.6)
Hemoglobin: 9.5 g/dL — ABNORMAL LOW (ref 11.1–15.9)
Immature Grans (Abs): 0 10*3/uL (ref 0.0–0.1)
Immature Granulocytes: 1 %
Lymphocytes Absolute: 1.6 10*3/uL (ref 0.7–3.1)
Lymphs: 23 %
MCH: 27.9 pg (ref 26.6–33.0)
MCHC: 32.3 g/dL (ref 31.5–35.7)
MCV: 86 fL (ref 79–97)
Monocytes Absolute: 0.4 10*3/uL (ref 0.1–0.9)
Monocytes: 5 %
Neutrophils Absolute: 4.8 10*3/uL (ref 1.4–7.0)
Neutrophils: 69 %
Platelets: 298 10*3/uL (ref 150–450)
RBC: 3.41 x10E6/uL — ABNORMAL LOW (ref 3.77–5.28)
RDW: 13.5 % (ref 11.7–15.4)
WBC: 6.9 10*3/uL (ref 3.4–10.8)

## 2023-10-06 LAB — CMP14+EGFR
ALT: 11 [IU]/L (ref 0–32)
AST: 16 [IU]/L (ref 0–40)
Albumin: 3.5 g/dL — ABNORMAL LOW (ref 3.9–4.9)
Alkaline Phosphatase: 106 [IU]/L (ref 44–121)
BUN/Creatinine Ratio: 20 (ref 9–23)
BUN: 19 mg/dL (ref 6–20)
Bilirubin Total: 0.2 mg/dL (ref 0.0–1.2)
CO2: 21 mmol/L (ref 20–29)
Calcium: 8.9 mg/dL (ref 8.7–10.2)
Chloride: 102 mmol/L (ref 96–106)
Creatinine, Ser: 0.94 mg/dL (ref 0.57–1.00)
Globulin, Total: 2.8 g/dL (ref 1.5–4.5)
Glucose: 274 mg/dL — ABNORMAL HIGH (ref 70–99)
Potassium: 4.6 mmol/L (ref 3.5–5.2)
Sodium: 135 mmol/L (ref 134–144)
Total Protein: 6.3 g/dL (ref 6.0–8.5)
eGFR: 83 mL/min/{1.73_m2} (ref >59)

## 2023-10-06 LAB — VITAMIN B12: Vitamin B-12: 297 pg/mL (ref 232–1245)

## 2023-10-06 LAB — LIPID PANEL
Chol/HDL Ratio: 3.5 {ratio} (ref 0.0–4.4)
Cholesterol, Total: 191 mg/dL (ref 100–199)
HDL: 54 mg/dL (ref >39)
LDL Chol Calc (NIH): 120 mg/dL — ABNORMAL HIGH (ref 0–99)
Triglycerides: 96 mg/dL (ref 0–149)
VLDL Cholesterol Cal: 17 mg/dL (ref 5–40)

## 2023-10-06 LAB — TSH: TSH: 1.7 u[IU]/mL (ref 0.450–4.500)

## 2023-10-06 LAB — VITAMIN D 25 HYDROXY (VIT D DEFICIENCY, FRACTURES): Vit D, 25-Hydroxy: 4.8 ng/mL — ABNORMAL LOW (ref 30.0–100.0)

## 2023-10-06 LAB — HEMOGLOBIN A1C
Est. average glucose Bld gHb Est-mCnc: 260 mg/dL
Hgb A1c MFr Bld: 10.7 % — ABNORMAL HIGH (ref 4.8–5.6)

## 2023-12-02 NOTE — Assessment & Plan Note (Signed)
Continue current meds.  Will adjust as needed based on results.  The patient is asked to make an attempt to improve diet and exercise patterns to aid in medical management of this problem. Addressed importance of increasing and maintaining water intake.   

## 2023-12-02 NOTE — Assessment & Plan Note (Signed)
Blood pressure has been well controlled with current medications.  Patient will restart previous medications, and continue current therapy.   Will reassess at follow up.

## 2023-12-02 NOTE — Assessment & Plan Note (Signed)
Checking labs today.  Continue current therapy for lipid control. Will modify as needed based on labwork results.  

## 2023-12-02 NOTE — Progress Notes (Signed)
Established Patient Office Visit  Subjective:  Patient ID: Katie Gilbert, female    DOB: [birth year]  Age: 32 y.o. MRN: [id number]  Chief Complaint  Patient presents with   Follow-up    Not feeling well, nausea, worse than usual cycles.    Patient presents today with multiple concerns.  She has been feeling very poorly again, admits to not taking care of herself and not doing what she needs to do to control her diabetes.  Also has been having continued GI issues, suspect that she has gastroparesis 2/2 her diabetes.   No other specific concerns today.     No other concerns at this time.   Past Medical History:  Diagnosis Date   C. difficile enteritis 07/24/2021   Diarrhea 07/22/2021   Genital herpes    Nausea & vomiting 07/22/2021   Type II diabetes mellitus with renal manifestations [facility])     Past Surgical History:  Procedure Laterality Date   FOOT SURGERY Right    I & D EXTREMITY Right 01/17/2019   Procedure: IRRIGATION AND DEBRIDEMENT RIGHT FOOT;  Surgeon: Gwyneth Revels, DPM;  Location: [facility] ORS;  Service: Podiatry;  Laterality: Right;    Social History   Socioeconomic History   Marital status: Single    Spouse name: Not on file   Number of children: Not on file   Years of education: Not on file   Highest education level: Not on file  Occupational History   Not on file  Tobacco Use   Smoking status: Light Smoker    Current packs/day: 0.20    Types: Cigarettes   Smokeless tobacco: Never  Vaping Use   Vaping status: Never Used  Substance and Sexual Activity   Alcohol use: No   Drug use: No   Sexual activity: Not on file  Other Topics Concern   Not on file  Social History Narrative   Not on file   Social Drivers of Health   Financial Resource Strain: Low Risk  (09/08/2021)   Received from [facility], [facility]   Overall Financial Resource Strain (CARDIA)    Difficulty of Paying Living Expenses: Not hard at all  Food Insecurity: No Food  Insecurity (09/08/2021)   Received from [facility], [facility]   Hunger Vital Sign    Worried About Running Out of Food in the Last Year: Never true    Ran Out of Food in the Last Year: Never true  Transportation Needs: No Transportation Needs (09/08/2021)   Received from [facility], [facility]   [facility] - Transportation    Lack of Transportation (Medical): No    Lack of Transportation (Non-Medical): No  Physical Activity: Inactive (07/23/2021)   Received from [facility], [facility]   Exercise Vital Sign    Days of Exercise per Week: 0 days    Minutes of Exercise per Session: 0 min  Stress: No Stress Concern Present (07/23/2021)   Received from [facility], [facility] of Occupational Health - Occupational Stress Questionnaire    Feeling of Stress : Only a little  Social Connections: Moderately Isolated (07/23/2021)   Received from [facility], [facility]   Social Connection and Isolation Panel [NHANES]    Frequency of Communication with Friends and Family: More than three times a week    Frequency of Social Gatherings with Friends and Family: More than three times a week  Attends Religious Services: 1 to 4 times per year    Active Member of Clubs or Organizations: No    Attends Banker Meetings: Never    Marital Status: Never married  Intimate Partner Violence: Not At Risk (07/23/2021)   Received from [facility], [facility]   Humiliation, Afraid, Rape, and Kick questionnaire    Fear of Current or Ex-Partner: No    Emotionally Abused: No    Physically Abused: No    Sexually Abused: No    Family History  Problem Relation Age of Onset   Hypertension Father    Heart attack Father    Diabetes Father    Diabetes Maternal Grandmother     Allergies  Allergen Reactions   Haloperidol Other (See Comments)    Dystonic reaction  Neck spasms    Review of Systems  Constitutional:  Positive for  malaise/fatigue.  Gastrointestinal:  Positive for abdominal pain, constipation, nausea and vomiting.  Musculoskeletal:  Positive for myalgias.  Neurological:  Positive for dizziness, weakness and headaches.  Psychiatric/Behavioral:  Positive for depression. The patient is nervous/anxious.   All other systems reviewed and are negative.      Objective:   BP (!) 160/100   Pulse (!) 104   Ht 6\' 3"  (1.905 m)   Wt 259 lb 6.4 oz (117.7 kg)   SpO2 99%   BMI 32.42 kg/m   Vitals:   10/05/23 1534  BP: (!) 160/100  Pulse: (!) 104  Height: 6\' 3"  (1.905 m)  Weight: 259 lb 6.4 oz (117.7 kg)  SpO2: 99%  BMI (Calculated): 32.42    Physical Exam Vitals and nursing note reviewed.  Constitutional:      General: She is awake. She is not in acute distress.    Appearance: She is well-developed and overweight. She is ill-appearing. She is not toxic-appearing.     Comments: Appears unkempt.  HENT:     Head: Normocephalic and atraumatic.     Jaw: There is normal jaw occlusion.     Nose: Nose normal.  Eyes:     Extraocular Movements: Extraocular movements intact.     Conjunctiva/sclera: Conjunctivae normal.     Pupils: Pupils are equal, round, and reactive to light.  Cardiovascular:     Rate and Rhythm: Normal rate and regular rhythm.     Heart sounds: Normal heart sounds.  Pulmonary:     Effort: Pulmonary effort is normal.  Abdominal:     General: Bowel sounds are decreased.     Tenderness: There is generalized abdominal tenderness.  Musculoskeletal:     Right lower leg: 2+ Pitting Edema present.     Left lower leg: 2+ Pitting Edema present.  Neurological:     General: No focal deficit present.     Mental Status: She is alert and oriented to person, place, and time. Mental status is at baseline.     Motor: Weakness (BLE) present.     Coordination: Coordination is intact.     Gait: Gait abnormal (Shuffling).  Psychiatric:        Attention and Perception: Attention and perception  normal.        Mood and Affect: Mood is depressed. Affect is flat.        Behavior: Behavior normal. Behavior is cooperative.        Thought Content: Thought content normal.        Cognition and Memory: Cognition and memory normal.        Judgment:  Judgment normal.      Results for orders placed or performed in visit on 10/05/23  Lipid panel  Result Value Ref Range   Cholesterol, Total 191 100 - 199 mg/dL   Triglycerides 96 0 - 149 mg/dL   HDL 54 >09 mg/dL   VLDL Cholesterol Cal 17 5 - 40 mg/dL   LDL Chol Calc (NIH) 811 (H) 0 - 99 mg/dL   Chol/HDL Ratio 3.5 0.0 - 4.4 ratio  VITAMIN D 25 Hydroxy (Vit-D Deficiency, Fractures)  Result Value Ref Range   Vit D, 25-Hydroxy 4.8 (L) 30.0 - 100.0 ng/mL  CMP14+EGFR  Result Value Ref Range   Glucose 274 (H) 70 - 99 mg/dL   BUN 19 6 - 20 mg/dL   Creatinine, Ser 9.14 0.57 - 1.00 mg/dL   eGFR 83 >78 GN/FAO/1.30   BUN/Creatinine Ratio 20 9 - 23   Sodium 135 134 - 144 mmol/L   Potassium 4.6 3.5 - 5.2 mmol/L   Chloride 102 96 - 106 mmol/L   CO2 21 20 - 29 mmol/L   Calcium 8.9 8.7 - 10.2 mg/dL   Total Protein 6.3 6.0 - 8.5 g/dL   Albumin 3.5 (L) 3.9 - 4.9 g/dL   Globulin, Total 2.8 1.5 - 4.5 g/dL   Bilirubin Total 0.2 0.0 - 1.2 mg/dL   Alkaline Phosphatase 106 44 - 121 IU/L   AST 16 0 - 40 IU/L   ALT 11 0 - 32 IU/L  TSH  Result Value Ref Range   TSH 1.700 0.450 - 4.500 uIU/mL  Hemoglobin A1c  Result Value Ref Range   Hgb A1c MFr Bld 10.7 (H) 4.8 - 5.6 %   Est. average glucose Bld gHb Est-mCnc 260 mg/dL  Vitamin Q65  Result Value Ref Range   Vitamin B-12 297 232 - 1,245 pg/mL  CBC with Diff  Result Value Ref Range   WBC 6.9 3.4 - 10.8 x10E3/uL   RBC 3.41 (L) 3.77 - 5.28 x10E6/uL   Hemoglobin 9.5 (L) 11.1 - 15.9 g/dL   Hematocrit 78.4 (L) 69.6 - 46.6 %   MCV 86 79 - 97 fL   MCH 27.9 26.6 - 33.0 pg   MCHC 32.3 31.5 - 35.7 g/dL   RDW 29.5 28.4 - 13.2 %   Platelets 298 150 - 450 x10E3/uL   Neutrophils 69 Not Estab. %   Lymphs  23 Not Estab. %   Monocytes 5 Not Estab. %   Eos 1 Not Estab. %   Basos 1 Not Estab. %   Neutrophils Absolute 4.8 1.4 - 7.0 x10E3/uL   Lymphocytes Absolute 1.6 0.7 - 3.1 x10E3/uL   Monocytes Absolute 0.4 0.1 - 0.9 x10E3/uL   EOS (ABSOLUTE) 0.1 0.0 - 0.4 x10E3/uL   Basophils Absolute 0.0 0.0 - 0.2 x10E3/uL   Immature Granulocytes 1 Not Estab. %   Immature Grans (Abs) 0.0 0.0 - 0.1 x10E3/uL    Recent Results (from the past 2160 hours)  Lipid panel     Status: Abnormal   Collection Time: 10/05/23  4:19 PM  Result Value Ref Range   Cholesterol, Total 191 100 - 199 mg/dL   Triglycerides 96 0 - 149 mg/dL   HDL 54 >44 mg/dL   VLDL Cholesterol Cal 17 5 - 40 mg/dL   LDL Chol Calc (NIH) 010 (H) 0 - 99 mg/dL   Chol/HDL Ratio 3.5 0.0 - 4.4 ratio    Comment:  T. Chol/HDL Ratio                                             Men  Women                               1/2 Avg.Risk  3.4    3.3                                   Avg.Risk  5.0    4.4                                2X Avg.Risk  9.6    7.1                                3X Avg.Risk 23.4   11.0   VITAMIN D 25 Hydroxy (Vit-D Deficiency, Fractures)     Status: Abnormal   Collection Time: 10/05/23  4:19 PM  Result Value Ref Range   Vit D, 25-Hydroxy 4.8 (L) 30.0 - 100.0 ng/mL    Comment: Vitamin D deficiency has been defined by the Institute of Medicine and an Endocrine Society practice guideline as a level of serum 25-OH vitamin D less than 20 ng/mL (1,2). The Endocrine Society went on to further define vitamin D insufficiency as a level between 21 and 29 ng/mL (2). 1. IOM (Institute of Medicine). 2010. Dietary reference    intakes for calcium and D. [place]: The    [employer]. 2. Holick MF, Binkley [place], Bischoff-Ferrari HA, et al.    Evaluation, treatment, and prevention of vitamin D    deficiency: an Endocrine Society clinical practice    guideline. JCEM. 2011 Jul; 96(7):1911-30.    CMP14+EGFR     Status: Abnormal   Collection Time: 10/05/23  4:19 PM  Result Value Ref Range   Glucose 274 (H) 70 - 99 mg/dL   BUN 19 6 - 20 mg/dL   Creatinine, Ser 8.41 0.57 - 1.00 mg/dL   eGFR 83 >32 GM/WNU/2.72   BUN/Creatinine Ratio 20 9 - 23   Sodium 135 134 - 144 mmol/L   Potassium 4.6 3.5 - 5.2 mmol/L   Chloride 102 96 - 106 mmol/L   CO2 21 20 - 29 mmol/L   Calcium 8.9 8.7 - 10.2 mg/dL   Total Protein 6.3 6.0 - 8.5 g/dL   Albumin 3.5 (L) 3.9 - 4.9 g/dL   Globulin, Total 2.8 1.5 - 4.5 g/dL   Bilirubin Total 0.2 0.0 - 1.2 mg/dL   Alkaline Phosphatase 106 44 - 121 IU/L   AST 16 0 - 40 IU/L   ALT 11 0 - 32 IU/L  TSH     Status: None   Collection Time: 10/05/23  4:19 PM  Result Value Ref Range   TSH 1.700 0.450 - 4.500 uIU/mL  Hemoglobin A1c     Status: Abnormal   Collection Time: 10/05/23  4:19 PM  Result Value Ref Range   Hgb A1c MFr Bld 10.7 (H) 4.8 - 5.6 %    Comment:          Prediabetes:  5.7 - 6.4          Diabetes: >6.4          Glycemic control for adults with diabetes: <7.0    Est. average glucose Bld gHb Est-mCnc 260 mg/dL  Vitamin Z61     Status: None   Collection Time: 10/05/23  4:19 PM  Result Value Ref Range   Vitamin B-12 297 232 - 1,245 pg/mL  CBC with Diff     Status: Abnormal   Collection Time: 10/05/23  4:19 PM  Result Value Ref Range   WBC 6.9 3.4 - 10.8 x10E3/uL   RBC 3.41 (L) 3.77 - 5.28 x10E6/uL   Hemoglobin 9.5 (L) 11.1 - 15.9 g/dL   Hematocrit 09.6 (L) 04.5 - 46.6 %   MCV 86 79 - 97 fL   MCH 27.9 26.6 - 33.0 pg   MCHC 32.3 31.5 - 35.7 g/dL   RDW 40.9 81.1 - 91.4 %   Platelets 298 150 - 450 x10E3/uL   Neutrophils 69 Not Estab. %   Lymphs 23 Not Estab. %   Monocytes 5 Not Estab. %   Eos 1 Not Estab. %   Basos 1 Not Estab. %   Neutrophils Absolute 4.8 1.4 - 7.0 x10E3/uL   Lymphocytes Absolute 1.6 0.7 - 3.1 x10E3/uL   Monocytes Absolute 0.4 0.1 - 0.9 x10E3/uL   EOS (ABSOLUTE) 0.1 0.0 - 0.4 x10E3/uL   Basophils Absolute 0.0 0.0 - 0.2  x10E3/uL   Immature Granulocytes 1 Not Estab. 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  Have reminded patient that she needs to continue her diabetic medications to prevent further loss.   Continue current meds.        Type 2 diabetes mellitus with circulatory disorder, with long-term current use of insulin (HCC)   I have reminded patient that she was doing much better when taking her medications regularly.  She does verbalize agreement with this, but says that she has been so depressed lately that she doesn't want to care for herself.  We will restart her medications today, so that we are able to get her back on track.       Relevant Orders   CMP14+EGFR (Completed)   Hemoglobin A1c (Completed)   CBC with Diff (Completed)   Obesity (BMI 30-39.9)   Continue current meds.  Will adjust as needed based on results.  The patient is asked to make an attempt to improve diet and exercise patterns to aid in medical management of this problem. Addressed importance of increasing and maintaining water intake.       Diabetic gastroparesis associated with type 2 diabetes mellitus (HCC)   Will set up referral to GI specialist.  Patient instructed to eat small meals multiple times a day and to try to get enough protein by utilizing protein drinks when possible.   Reassess at follow up.       Primary hypertension   Blood pressure has been well controlled with current medications.  Patient will restart previous medications, and continue current therapy.   Will reassess at follow up.        Mixed hyperlipidemia   Checking labs today.  Continue current therapy for lipid control. Will modify as needed based on labwork results.  Relevant Orders   Lipid panel (Completed)   CMP14+EGFR (Completed)   CBC with Diff  (Completed)   Other Visit Diagnoses       Vitamin D deficiency, unspecified    -  Primary   Checking labs today.  Will continue supplements as needed.   Relevant Orders   VITAMIN D 25 Hydroxy (Vit-D Deficiency, Fractures) (Completed)   CMP14+EGFR (Completed)   CBC with Diff (Completed)     Dehydration       Encouraged pt to increase fluids.  I have also informed her that she can get fluids here in office if needed.  She verbalizes understanding. Resassess at f/u   Relevant Orders   CMP14+EGFR (Completed)   CBC with Diff (Completed)     B12 deficiency due to diet       Checking labs today.  Will continue supplements as needed.   Relevant Orders   CMP14+EGFR (Completed)   Vitamin B12 (Completed)   CBC with Diff (Completed)     Other fatigue       Relevant Orders   CMP14+EGFR (Completed)   TSH (Completed)   CBC with Diff (Completed)       Return in about 1 month (around 11/05/2023) for F/U.   Total time spent: 30 minutes  Miki Kins, FNP  10/05/2023   This document may have been prepared by [facility] Voice Recognition software and as such may include unintentional dictation errors.

## 2023-12-02 NOTE — Assessment & Plan Note (Signed)
Will set up referral to GI specialist.  Patient instructed to eat small meals multiple times a day and to try to get enough protein by utilizing protein drinks when possible.   Reassess at follow up.

## 2023-12-02 NOTE — Assessment & Plan Note (Signed)
I have reminded patient that she was doing much better when taking her medications regularly.  She does verbalize agreement with this, but says that she has been so depressed lately that she doesn't want to care for herself.  We will restart her medications today, so that we are able to get her back on track.

## 2023-12-02 NOTE — Assessment & Plan Note (Signed)
Patient stable.  Have reminded patient that she needs to continue her diabetic medications to prevent further loss.   Continue current meds.

## 2023-12-28 ENCOUNTER — Other Ambulatory Visit: Payer: Self-pay

## 2023-12-28 MED ORDER — BUSPIRONE HCL 5 MG PO TABS: 5.0000 mg | ORAL_TABLET | Freq: Three times a day (TID) | ORAL | 1 refills | Status: AC

## 2024-01-18 ENCOUNTER — Encounter: Payer: Self-pay | Admitting: Cardiology

## 2024-01-18 ENCOUNTER — Ambulatory Visit: Payer: Managed Care, Other (non HMO) | Admitting: Cardiology

## 2024-01-18 VITALS — BP 138/74 | HR 111 | Ht 75.0 in | Wt 275.0 lb

## 2024-01-18 DIAGNOSIS — R600 Localized edema: Secondary | ICD-10-CM | POA: Diagnosis not present

## 2024-01-18 DIAGNOSIS — Z0130 Encounter for examination of blood pressure without abnormal findings: Secondary | ICD-10-CM

## 2024-01-18 MED ORDER — FUROSEMIDE 20 MG PO TABS: ORAL_TABLET | ORAL | 11 refills | Status: AC

## 2024-01-18 NOTE — Progress Notes (Signed)
Established Patient Office Visit  Subjective:  Patient ID: Katie Gilbert, female    DOB: Apr 26, 1991  Age: 33 y.o. MRN: [id number]  Chief Complaint  Patient presents with   Acute Visit    Bilateral Leg/ Feet Swelling     Patient in office for bilateral leg and feet edema. Patient was experiencing edema at previous appointment. Patient not taking any of her medications. Discussed importance of taking medications to control DM, HLD. Also vitamin D very low. Patient unsure of what medications she has at home. List of medications given to patient to check at home. Will send in in furosemide for edema. Follow up with regular provider in 2 weeks.     No other concerns at this time.   Past Medical History:  Diagnosis Date   C. difficile enteritis 07/24/2021   Diarrhea 07/22/2021   Genital herpes    Nausea & vomiting 07/22/2021   Type II diabetes mellitus with renal manifestations [facility])     Past Surgical History:  Procedure Laterality Date   FOOT SURGERY Right    I & D EXTREMITY Right 01/17/2019   Procedure: IRRIGATION AND DEBRIDEMENT RIGHT FOOT;  Surgeon: Gwyneth Revels, DPM;  Location: [facility] ORS;  Service: Podiatry;  Laterality: Right;    Social History   Socioeconomic History   Marital status: Single    Spouse name: Not on file   Number of children: Not on file   Years of education: Not on file   Highest education level: Not on file  Occupational History   Not on file  Tobacco Use   Smoking status: Light Smoker    Current packs/day: 0.20    Types: Cigarettes   Smokeless tobacco: Never  Vaping Use   Vaping status: Never Used  Substance and Sexual Activity   Alcohol use: No   Drug use: No   Sexual activity: Not on file  Other Topics Concern   Not on file  Social History Narrative   Not on file   Social Drivers of Health   Financial Resource Strain: Low Risk  (09/08/2021)   Received from [facility], [facility]   Overall Financial Resource Strain  (CARDIA)    Difficulty of Paying Living Expenses: Not hard at all  Food Insecurity: No Food Insecurity (09/08/2021)   Received from [facility], [facility]   Hunger Vital Sign    Worried About Running Out of Food in the Last Year: Never true    Ran Out of Food in the Last Year: Never true  Transportation Needs: No Transportation Needs (09/08/2021)   Received from [facility], [facility]   [facility] - Transportation    Lack of Transportation (Medical): No    Lack of Transportation (Non-Medical): No  Physical Activity: Inactive (07/23/2021)   Received from [facility], [facility]   Exercise Vital Sign    Days of Exercise per Week: 0 days    Minutes of Exercise per Session: 0 min  Stress: No Stress Concern Present (07/23/2021)   Received from [facility], [facility] of Occupational Health - Occupational Stress Questionnaire    Feeling of Stress : Only a little  Social Connections: Moderately Isolated (07/23/2021)   Received from [facility], [facility]   Social Connection and Isolation Panel [NHANES]    Frequency of Communication with Friends and Family: More than three times a week    Frequency of Social  Gatherings with Friends and Family: More than three times a week    Attends Religious Services: 1 to 4 times per year    Active Member of [employer] or Organizations: No    Attends Banker Meetings: Never    Marital Status: Never married  Intimate Partner Violence: Not At Risk (07/23/2021)   Received from [facility], [facility]   Humiliation, Afraid, Rape, and Kick questionnaire    Fear of Current or Ex-Partner: No    Emotionally Abused: No    Physically Abused: No    Sexually Abused: No    Family History  Problem Relation Age of Onset   Hypertension Father    Heart attack Father    Diabetes Father    Diabetes Maternal Grandmother     Allergies  Allergen Reactions   Haloperidol Other (See Comments)     Dystonic reaction  Neck spasms    Outpatient Medications Prior to Visit  Medication Sig   blood glucose meter kit and supplies KIT Dispense based on patient and insurance preference. Use up to four times daily as directed. (FOR ICD-9 250.00, 250.01). (Patient not taking: Reported on 10/05/2023)   busPIRone (BUSPAR) 5 MG tablet Take 1 tablet (5 mg total) by mouth 3 (three) times daily.   DULoxetine (CYMBALTA) 20 MG capsule Take 1 capsule (20 mg total) by mouth daily. (Patient not taking: Reported on 10/05/2023)   hydrochlorothiazide (HYDRODIURIL) 12.5 MG tablet Take 1 tablet (12.5 mg total) by mouth daily. (Patient not taking: Reported on 10/05/2023)   metoCLOPramide (REGLAN) 10 MG tablet Take 1 tablet (10 mg total) by mouth 3 (three) times daily with meals. (Patient not taking: Reported on 10/05/2023)   prochlorperazine (COMPAZINE) 5 MG tablet Take 1 tablet (5 mg total) by mouth every 6 (six) hours as needed for nausea or vomiting. (Patient not taking: Reported on 10/05/2023)   rosuvastatin (CRESTOR) 5 MG tablet Take 1 tablet (5 mg total) by mouth daily. (Patient not taking: Reported on 10/05/2023)   tirzepatide [facility]) 2.5 MG/0.5ML Pen Inject 2.5 mg into the skin once a week. (Patient not taking: Reported on 10/05/2023)   Vitamin D, Ergocalciferol, (DRISDOL) 1.25 MG (50000 UNIT) CAPS capsule Take 1 capsule (50,000 Units total) by mouth every 7 (seven) days. (Patient not taking: Reported on 10/05/2023)   No facility-administered medications prior to visit.    Review of Systems  Constitutional: Negative.   HENT: Negative.    Eyes: Negative.   Respiratory: Negative.  Negative for shortness of breath.   Cardiovascular:  Positive for leg swelling. Negative for chest pain.  Gastrointestinal: Negative.  Negative for abdominal pain, constipation and diarrhea.  Genitourinary: Negative.   Musculoskeletal:  Negative for joint pain and myalgias.  Skin: Negative.   Neurological: Negative.   Negative for dizziness and headaches.  Endo/Heme/Allergies: Negative.   All other systems reviewed and are negative.      Objective:   BP 138/74   Pulse (!) 111   Ht 6\' 3"  (1.905 m)   Wt 275 lb (124.7 kg)   SpO2 98%   BMI 34.37 kg/m   Vitals:   01/18/24 1539  BP: 138/74  Pulse: (!) 111  Height: 6\' 3"  (1.905 m)  Weight: 275 lb (124.7 kg)  SpO2: 98%  BMI (Calculated): 34.37    Physical Exam Vitals and nursing note reviewed.  Constitutional:      Appearance: Normal appearance. She is normal weight.  HENT:     Head: Normocephalic and atraumatic.  Nose: Nose normal.     Mouth/Throat:     Mouth: Mucous membranes are moist.  Eyes:     Extraocular Movements: Extraocular movements intact.     Conjunctiva/sclera: Conjunctivae normal.     Pupils: Pupils are equal, round, and reactive to light.  Cardiovascular:     Rate and Rhythm: Normal rate and regular rhythm.     Pulses: Normal pulses.     Heart sounds: Normal heart sounds.  Pulmonary:     Effort: Pulmonary effort is normal.     Breath sounds: Normal breath sounds.  Abdominal:     General: Abdomen is flat. Bowel sounds are normal.     Palpations: Abdomen is soft.  Musculoskeletal:        General: Swelling present. Normal range of motion.     Cervical back: Normal range of motion.     Right lower leg: 2+ Edema present.     Left lower leg: 2+ Edema present.  Skin:    General: Skin is warm and dry.  Neurological:     General: No focal deficit present.     Mental Status: She is alert and oriented to person, place, and time.  Psychiatric:        Mood and Affect: Mood normal.        Behavior: Behavior normal.        Thought Content: Thought content normal.        Judgment: Judgment normal.      No results found for any visits on 01/18/24.  No results found for this or any previous visit (from the past 2160 hours).    Assessment & Plan:  Furosemide  for edema Check medications at home  Problem List  Items Addressed This Visit       Other   Bilateral lower extremity edema - Primary    Return in about 2 weeks (around 02/01/2024) for with Marchelle Folks.   Total time spent: 25 minutes  [employer], NP  01/18/2024   This document may have been prepared by Dragon Voice Recognition software and as such may include unintentional dictation errors.

## 2024-01-30 ENCOUNTER — Encounter: Payer: Self-pay | Admitting: Family

## 2024-01-30 ENCOUNTER — Ambulatory Visit: Payer: Managed Care, Other (non HMO) | Admitting: Family

## 2024-01-30 VITALS — BP 120/86 | HR 107 | Ht 75.0 in | Wt 264.0 lb

## 2024-01-30 DIAGNOSIS — E782 Mixed hyperlipidemia: Secondary | ICD-10-CM

## 2024-01-30 DIAGNOSIS — K3184 Gastroparesis: Secondary | ICD-10-CM

## 2024-01-30 DIAGNOSIS — E669 Obesity, unspecified: Secondary | ICD-10-CM

## 2024-01-30 DIAGNOSIS — E1143 Type 2 diabetes mellitus with diabetic autonomic (poly)neuropathy: Secondary | ICD-10-CM

## 2024-01-30 DIAGNOSIS — Z794 Long term (current) use of insulin: Secondary | ICD-10-CM

## 2024-01-30 DIAGNOSIS — R600 Localized edema: Secondary | ICD-10-CM | POA: Diagnosis not present

## 2024-01-30 DIAGNOSIS — E1159 Type 2 diabetes mellitus with other circulatory complications: Secondary | ICD-10-CM | POA: Diagnosis not present

## 2024-01-30 DIAGNOSIS — Z0130 Encounter for examination of blood pressure without abnormal findings: Secondary | ICD-10-CM

## 2024-01-30 NOTE — Progress Notes (Signed)
 Established Patient Office Visit  Subjective:  Patient ID: Katie Gilbert, female    DOB: Jun 14, 1991  Age: 33 y.o. MRN: [id number]  Chief Complaint  Patient presents with   Follow-up    2 week follow up    Patient is here today for her 2 week follow up.  She has been feeling poorly since last appointment.   She does have additional concerns to discuss today.  Swelling is better today, but she says that the pill hasn't really been helping with her swelling as much.   Left > Right.    Anxiety has been worse here lately.  Has trouble getting off of work from her office, says that they always want a detailed timeline of what she needs off, which she can't always do.    1) Need to figure out what is causing the swelling.  She says that she has the swelling more on days when she hast to be on her feet or if she has to work, where she's sitting at a desk all day  2) FMLA paperwork - she will have them send to us .   3) Stomach issues - need to see what we can do for her Gastroparesis. Labs are not due today. She needs refills.   I have reviewed her active problem list, medication list, allergies, health maintenance, notes from last encounter, lab results for her appointment today.      No other concerns at this time.   Past Medical History:  Diagnosis Date   C. difficile enteritis 07/24/2021   Diarrhea 07/22/2021   Genital herpes    Nausea & vomiting 07/22/2021   Type II diabetes mellitus with renal manifestations [facility])     Past Surgical History:  Procedure Laterality Date   FOOT SURGERY Right    I & D EXTREMITY Right 01/17/2019   Procedure: IRRIGATION AND DEBRIDEMENT RIGHT FOOT;  Surgeon: Anell Baptist, DPM;  Location: [facility] ORS;  Service: Podiatry;  Laterality: Right;    Social History   Socioeconomic History   Marital status: Single    Spouse name: Not on file   Number of children: Not on file   Years of education: Not on file   Highest education level: Not  on file  Occupational History   Not on file  Tobacco Use   Smoking status: Light Smoker    Current packs/day: 0.20    Types: Cigarettes   Smokeless tobacco: Never  Vaping Use   Vaping status: Never Used  Substance and Sexual Activity   Alcohol use: No   Drug use: No   Sexual activity: Not on file  Other Topics Concern   Not on file  Social History Narrative   Not on file   Social Drivers of Health   Financial Resource Strain: Low Risk  (09/08/2021)   [facility], [facility]   Overall Financial Resource Strain (CARDIA)    Difficulty of Paying Living Expenses: Not hard at all  Food Insecurity: No Food Insecurity (09/08/2021)   Received from [facility], [facility]   Hunger Vital Sign    Worried About Running Out of Food in the Last Year: Never true    Ran Out of Food in the Last Year: Never true  Transportation Needs: No Transportation Needs (09/08/2021)   Received from [facility], [facility]   [facility] - Transportation    Lack of Transportation (Medical): No    Lack of Transportation (Non-Medical):  No  Physical Activity: Inactive (07/23/2021)   Received from [facility], [facility]   Exercise Vital Sign    Days of Exercise per Week: 0 days    Minutes of Exercise per Session: 0 min  Stress: No Stress Concern Present (07/23/2021)   Received from [facility], [facility] - Occupational Stress Questionnaire    Feeling of Stress : Only a little  Social Connections: Moderately Isolated (07/23/2021)   Received from [facility], [facility]   Social Connection and Isolation Panel [NHANES]    Frequency of Communication with Friends and Family: More than three times a week    Frequency of Social Gatherings with Friends and Family: More than three times a week    Attends Religious Services: 1 to 4 times per year    Active Member of [employer] or Organizations: No    Attends Tax inspector Meetings: Never    Marital Status: Never married  Catering manager Violence: Not At Risk (07/23/2021)   Received from [facility], [facility]   Humiliation, Afraid, Rape, and Kick questionnaire    Fear of Current or Ex-Partner: No    Emotionally Abused: No    Physically Abused: No    Sexually Abused: No    Family History  Problem Relation Age of Onset   Hypertension Father    Heart attack Father    Diabetes Father    Diabetes Maternal Grandmother     Allergies  Allergen Reactions   Haloperidol Other (See Comments)    Dystonic reaction  Neck spasms    Review of Systems  Cardiovascular:  Positive for leg swelling.  Psychiatric/Behavioral:  Positive for depression. The patient is nervous/anxious.   All other systems reviewed and are negative.      Objective:   BP 120/86   Pulse (!) 107   Ht 6\' 3"  (1.905 m)   Wt 264 lb (119.7 kg)   SpO2 98%   BMI 33.00 kg/m   Vitals:   01/30/24 1309  BP: 120/86  Pulse: (!) 107  Height: 6\' 3"  (1.905 m)  Weight: 264 lb (119.7 kg)  SpO2: 98%  BMI (Calculated): 33    Physical Exam Vitals and nursing note reviewed.  Constitutional:      Appearance: Normal appearance. She is normal weight.  HENT:     Head: Normocephalic and atraumatic.  Eyes:     Extraocular Movements: Extraocular movements intact.     Conjunctiva/sclera: Conjunctivae normal.     Pupils: Pupils are equal, round, and reactive to light.  Cardiovascular:     Rate and Rhythm: Normal rate.  Pulmonary:     Effort: Pulmonary effort is normal.  Musculoskeletal:        General: Normal range of motion.  Neurological:     General: No focal deficit present.     Mental Status: She is alert and oriented to person, place, and time. Mental status is at baseline.  Psychiatric:        Mood and Affect: Mood normal.        Behavior: Behavior normal.        Thought Content: Thought content normal.      No results found for any visits on  01/30/24.  Recent Results (from the past 2160 hours)  Lipid panel     Status: Abnormal   Collection Time: 02/20/24  4:00 PM  Result Value Ref Range  Cholesterol, Total 264 (H) 100 - 199 mg/dL   Triglycerides 161 0 - 149 mg/dL   HDL 45 >09 mg/dL   VLDL Cholesterol Cal 24 5 - 40 mg/dL   LDL Chol Calc (NIH) 604 (H) 0 - 99 mg/dL   LDL CALC COMMENT: Comment     Comment: Consider evaluating for Familial Hypercholesterolemia(FH), if clinically indicated.    Chol/HDL Ratio 5.9 (H) 0.0 - 4.4 ratio    Comment:                                   T. Chol/HDL Ratio                                             Men  Women                               1/2 Avg.Risk  3.4    3.3                                   Avg.Risk  5.0    4.4                                2X Avg.Risk  9.6    7.1                                3X Avg.Risk 23.4   11.0   VITAMIN D 25 Hydroxy (Vit-D Deficiency, Fractures)     Status: Abnormal   Collection Time: 02/20/24  4:00 PM  Result Value Ref Range   Vit D, 25-Hydroxy <4.0 (L) 30.0 - 100.0 ng/mL    Comment: Vitamin D deficiency has been defined by the Institute of Medicine and an Endocrine Society practice guideline as a level of serum 25-OH vitamin D less than 20 ng/mL (1,2). The Endocrine Society went on to further define vitamin D insufficiency as a level between 21 and 29 ng/mL (2). 1. IOM (Institute of Medicine). 2010. Dietary reference    intakes for calcium and D. [place]: The    [employer]. 2. Holick MF, Binkley [place], Bischoff-Ferrari HA, et al.    Evaluation, treatment, and prevention of vitamin D    deficiency: an Endocrine Society clinical practice    guideline. JCEM. 2011 Jul; 96(7):1911-30.   CMP14+EGFR     Status: Abnormal   Collection Time: 02/20/24  4:00 PM  Result Value Ref Range   Glucose 227 (H) 70 - 99 mg/dL   BUN 13 6 - 20 mg/dL   Creatinine, Ser 5.40 (H) 0.57 - 1.00 mg/dL   eGFR 66 >98 JX/BJY/7.82   BUN/Creatinine Ratio 12 9  - 23   Sodium 135 134 - 144 mmol/L   Potassium 4.4 3.5 - 5.2 mmol/L   Chloride 100 96 - 106 mmol/L   CO2 23 20 - 29 mmol/L   Calcium 8.4 (L) 8.7 - 10.2 mg/dL   Total Protein 5.3 (L) 6.0 - 8.5 g/dL   Albumin 2.9 (L) 3.9 - 4.9 g/dL   Globulin, Total 2.4  1.5 - 4.5 g/dL   Bilirubin Total 0.3 0.0 - 1.2 mg/dL   Alkaline Phosphatase 101 44 - 121 IU/L   AST 10 0 - 40 IU/L   ALT 7 0 - 32 IU/L  TSH     Status: None   Collection Time: 02/20/24  4:00 PM  Result Value Ref Range   TSH 2.690 0.450 - 4.500 uIU/mL  Hemoglobin A1c     Status: Abnormal   Collection Time: 02/20/24  4:00 PM  Result Value Ref Range   Hgb A1c MFr Bld 8.4 (H) 4.8 - 5.6 %    Comment:          Prediabetes: 5.7 - 6.4          Diabetes: >6.4          Glycemic control for adults with diabetes: <7.0    Est. average glucose Bld gHb Est-mCnc 194 mg/dL  Vitamin B12     Status: None   Collection Time: 02/20/24  4:00 PM  Result Value Ref Range   Vitamin B-12 365 232 - 1,245 pg/mL  Iron, TIBC and Ferritin Panel     Status: Abnormal   Collection Time: 02/20/24  4:00 PM  Result Value Ref Range   Total Iron Binding Capacity 239 (L) 250 - 450 ug/dL   UIBC 914 782 - 956 ug/dL   Iron 74 27 - 213 ug/dL   Iron Saturation 31 15 - 55 %   Ferritin 109 15 - 150 ng/mL       Assessment & Plan:   Problem List Items Addressed This Visit       Cardiovascular and Mediastinum   Type 2 diabetes mellitus with circulatory disorder, with long-term current use of insulin (HCC)   Despite her compliance and health issues, she is doing better with this than at her previous appointment.  Given how she is feeling today, I will wait until follow up to recheck her labs.          Digestive   Diabetic gastroparesis associated with type 2 diabetes mellitus (HCC)   Will resend GI referral for patient. Patient instructed to eat small meals multiple times a day and to try to get enough protein by utilizing protein drinks when possible.   Reassess  at follow up.         Other   Obesity (BMI 30-39.9)   Continue current meds.  Will adjust as needed based on results.  The patient is asked to make an attempt to improve diet and exercise patterns to aid in medical management of this problem. Addressed importance of increasing and maintaining water intake.        Mixed hyperlipidemia   Checking labs today.  Continue current therapy for lipid control. Will modify as needed based on labwork results.        Bilateral lower extremity edema - Primary   Patient stable.  Well controlled with current therapy.   Continue current meds.         Return in about 2 weeks (around 02/13/2024).   Total time spent: 20 minutes  Trenda Frisk, FNP  01/30/2024   This document may have been prepared by [facility] Voice Recognition software and as such may include unintentional dictation errors.

## 2024-02-11 ENCOUNTER — Telehealth: Payer: Self-pay | Admitting: Family

## 2024-02-11 NOTE — Telephone Encounter (Signed)
 Patients mom Arnoldo Hooker called asking if her FMLA paperwork is ready & also stated that her stomach was bothering her and is asking if something can be called in for it. If something can be called in [facility] [address] Please adv & call patient to let her know when ready to pick up.

## 2024-02-19 ENCOUNTER — Ambulatory Visit: Payer: Managed Care, Other (non HMO) | Admitting: Family

## 2024-02-20 ENCOUNTER — Ambulatory Visit (INDEPENDENT_AMBULATORY_CARE_PROVIDER_SITE_OTHER): Admitting: Family

## 2024-02-20 ENCOUNTER — Encounter: Payer: Self-pay | Admitting: Family

## 2024-02-20 VITALS — BP 134/86 | HR 107 | Ht 75.0 in | Wt 245.0 lb

## 2024-02-20 DIAGNOSIS — E538 Deficiency of other specified B group vitamins: Secondary | ICD-10-CM | POA: Diagnosis not present

## 2024-02-20 DIAGNOSIS — R195 Other fecal abnormalities: Secondary | ICD-10-CM

## 2024-02-20 DIAGNOSIS — E559 Vitamin D deficiency, unspecified: Secondary | ICD-10-CM

## 2024-02-20 DIAGNOSIS — E1159 Type 2 diabetes mellitus with other circulatory complications: Secondary | ICD-10-CM

## 2024-02-20 DIAGNOSIS — E782 Mixed hyperlipidemia: Secondary | ICD-10-CM

## 2024-02-20 DIAGNOSIS — R5383 Other fatigue: Secondary | ICD-10-CM

## 2024-02-20 DIAGNOSIS — I10 Essential (primary) hypertension: Secondary | ICD-10-CM

## 2024-02-20 DIAGNOSIS — Z794 Long term (current) use of insulin: Secondary | ICD-10-CM

## 2024-02-20 DIAGNOSIS — K3184 Gastroparesis: Secondary | ICD-10-CM

## 2024-02-20 DIAGNOSIS — R109 Unspecified abdominal pain: Secondary | ICD-10-CM

## 2024-02-20 DIAGNOSIS — E1143 Type 2 diabetes mellitus with diabetic autonomic (poly)neuropathy: Secondary | ICD-10-CM

## 2024-02-20 NOTE — Progress Notes (Signed)
 Established Patient Office Visit  Subjective:  Patient ID: Katie Gilbert, female    DOB: May 07, 1991  Age: 33 y.o. MRN: [id number]  Chief Complaint  Patient presents with   Follow-up    2 week follow up    Patient is here today for her follow up appointment.  She has been having continued issues with her stomach and her general state of health.  She does need FMLA paperwork completed today.  This should start - 2/18 as Intermittent leave, 2-4 times per month, 2-5 days per episode.    No other concerns at this time.   Past Medical History:  Diagnosis Date   C. difficile enteritis 07/24/2021   Diarrhea 07/22/2021   Genital herpes    Nausea & vomiting 07/22/2021   Type II diabetes mellitus with renal manifestations [facility])     Past Surgical History:  Procedure Laterality Date   FOOT SURGERY Right    I & D EXTREMITY Right 01/17/2019   Procedure: IRRIGATION AND DEBRIDEMENT RIGHT FOOT;  Surgeon: Anell Baptist, DPM;  Location: [facility] ORS;  Service: Podiatry;  Laterality: Right;    Social History   Socioeconomic History   Marital status: Single    Spouse name: Not on file   Number of children: Not on file   Years of education: Not on file   Highest education level: Not on file  Occupational History   Not on file  Tobacco Use   Smoking status: Light Smoker    Current packs/day: 0.20    Types: Cigarettes   Smokeless tobacco: Never  Vaping Use   Vaping status: Never Used  Substance and Sexual Activity   Alcohol use: No   Drug use: No   Sexual activity: Not on file  Other Topics Concern   Not on file  Social History Narrative   Not on file   Social Drivers of Health   Financial Resource Strain: Low Risk  (09/08/2021)   Received from [facility], [facility]   Overall Financial Resource Strain (CARDIA)    Difficulty of Paying Living Expenses: Not hard at all  Food Insecurity: No Food Insecurity (09/08/2021)   Received from [facility], [facility]    Hunger Vital Sign    Worried About Running Out of Food in the Last Year: Never true    Ran Out of Food in the Last Year: Never true  Transportation Needs: No Transportation Needs (09/08/2021)   Received from [facility], [facility]   [facility] - Transportation    Lack of Transportation (Medical): No    Lack of Transportation (Non-Medical): No  Physical Activity: Inactive (07/23/2021)   [facility] from [facility], [facility]   Exercise Vital Sign    Days of Exercise per Week: 0 days    Minutes of Exercise per Session: 0 min  Stress: No Stress Concern Present (07/23/2021)   Received from [facility], [facility] - Occupational Stress Questionnaire    Feeling of Stress : Only a little  Social Connections: Moderately Isolated (07/23/2021)   Received from [facility], [facility]   Social Connection and Isolation Panel [NHANES]    Frequency of Communication with Friends and Family: More than three times a week    Frequency of Social Gatherings with Friends and Family: More than three times a week    Attends Religious Services: 1 to 4 times per year  Active Member of Clubs or Organizations: No    Attends Banker Meetings: Never    Marital Status: Never married  Intimate Partner Violence: Not At Risk (07/23/2021)   Received from [facility], [facility]   Humiliation, Afraid, Rape, and Kick questionnaire    Fear of Current or Ex-Partner: No    Emotionally Abused: No    Physically Abused: No    Sexually Abused: No    Family History  Problem Relation Age of Onset   Hypertension Father    Heart attack Father    Diabetes Father    Diabetes Maternal Grandmother     Allergies  Allergen Reactions   Haloperidol Other (See Comments)    Dystonic reaction  Neck spasms    Review of Systems  Constitutional:  Positive for malaise/fatigue.  Cardiovascular:  Positive for leg swelling.   Musculoskeletal:  Positive for joint pain and myalgias.  All other systems reviewed and are negative.      Objective:   BP 134/86   Pulse (!) 107   Ht 6\' 3"  (1.905 m)   Wt 245 lb (111.1 kg)   SpO2 100%   BMI 30.62 kg/m   Vitals:   02/20/24 1530  BP: 134/86  Pulse: (!) 107  Height: 6\' 3"  (1.905 m)  Weight: 245 lb (111.1 kg)  SpO2: 100%  BMI (Calculated): 30.62    Physical Exam Vitals and nursing note reviewed.  Constitutional:      General: She is not in acute distress.    Appearance: Normal appearance. She is obese. She is ill-appearing. She is not toxic-appearing.  HENT:     Head: Normocephalic.  Eyes:     Extraocular Movements: Extraocular movements intact.     Conjunctiva/sclera: Conjunctivae normal.     Pupils: Pupils are equal, round, and reactive to light.  Cardiovascular:     Rate and Rhythm: Normal rate.  Pulmonary:     Effort: Pulmonary effort is normal.  Neurological:     General: No focal deficit present.     Mental Status: She is alert and oriented to person, place, and time. Mental status is at baseline.  Psychiatric:        Mood and Affect: Mood normal.        Behavior: Behavior normal.        Thought Content: Thought content normal.        Judgment: Judgment normal.      Results for orders placed or performed in visit on 02/20/24  Lipid panel  Result Value Ref Range   Cholesterol, Total 264 (H) 100 - 199 mg/dL   Triglycerides 960 0 - 149 mg/dL   HDL 45 >45 mg/dL   VLDL Cholesterol Cal 24 5 - 40 mg/dL   LDL Chol Calc (NIH) 409 (H) 0 - 99 mg/dL   LDL CALC COMMENT: Comment    Chol/HDL Ratio 5.9 (H) 0.0 - 4.4 ratio  VITAMIN D  25 Hydroxy (Vit-D Deficiency, Fractures)  Result Value Ref Range   Vit D, 25-Hydroxy <4.0 (L) 30.0 - 100.0 ng/mL  CMP14+EGFR  Result Value Ref Range   Glucose 227 (H) 70 - 99 mg/dL   BUN 13 6 - 20 mg/dL   Creatinine, Ser 8.11 (H) 0.57 - 1.00 mg/dL   eGFR 66 >91 YN/WGN/5.62   BUN/Creatinine Ratio 12 9 - 23    Sodium 135 134 - 144 mmol/L   Potassium 4.4 3.5 - 5.2 mmol/L   Chloride 100 96 - 106 mmol/L  CO2 23 20 - 29 mmol/L   Calcium  8.4 (L) 8.7 - 10.2 mg/dL   Total Protein 5.3 (L) 6.0 - 8.5 g/dL   Albumin 2.9 (L) 3.9 - 4.9 g/dL   Globulin, Total 2.4 1.5 - 4.5 g/dL   Bilirubin Total 0.3 0.0 - 1.2 mg/dL   Alkaline Phosphatase 101 44 - 121 IU/L   AST 10 0 - 40 IU/L   ALT 7 0 - 32 IU/L  TSH  Result Value Ref Range   TSH 2.690 0.450 - 4.500 uIU/mL  Hemoglobin A1c  Result Value Ref Range   Hgb A1c MFr Bld 8.4 (H) 4.8 - 5.6 %   Est. average glucose Bld gHb Est-mCnc 194 mg/dL  Vitamin B12  Result Value Ref Range   Vitamin B-12 365 232 - 1,245 pg/mL  Iron, TIBC and Ferritin Panel  Result Value Ref Range   Total Iron Binding Capacity 239 (L) 250 - 450 ug/dL   UIBC 161 096 - 045 ug/dL   Iron 74 27 - 409 ug/dL   Iron Saturation 31 15 - 55 %   Ferritin 109 15 - 150 ng/mL    Recent Results (from the past 2160 hours)  Lipid panel     Status: Abnormal   Collection Time: 02/20/24  4:00 PM  Result Value Ref Range   Cholesterol, Total 264 (H) 100 - 199 mg/dL   Triglycerides 811 0 - 149 mg/dL   HDL 45 >91 mg/dL   VLDL Cholesterol Cal 24 5 - 40 mg/dL   LDL Chol Calc (NIH) 478 (H) 0 - 99 mg/dL   LDL CALC COMMENT: Comment     Comment: Consider evaluating for Familial Hypercholesterolemia(FH), if clinically indicated.    Chol/HDL Ratio 5.9 (H) 0.0 - 4.4 ratio    Comment:                                   T. Chol/HDL Ratio                                             Men  Women                               1/2 Avg.Risk  3.4    3.3                                   Avg.Risk  5.0    4.4                                2X Avg.Risk  9.6    7.1                                3X Avg.Risk 23.4   11.0   VITAMIN D  25 Hydroxy (Vit-D Deficiency, Fractures)     Status: Abnormal   Collection Time: 02/20/24  4:00 PM  Result Value Ref Range   Vit D, 25-Hydroxy <4.0 (L) 30.0 - 100.0 ng/mL    Comment:  Vitamin D  deficiency has been defined by the Institute of  Medicine and an Endocrine Society practice guideline as a level of serum 25-OH vitamin D  less than 20 ng/mL (1,2). The Endocrine Society went on to further define vitamin D  insufficiency as a level between 21 and 29 ng/mL (2). 1. IOM (Institute of Medicine). 2010. Dietary reference    intakes for calcium  and D. [place]: The    [employer]. 2. Holick MF, Binkley [place], Bischoff-Ferrari HA, et al.    Evaluation, treatment, and prevention of vitamin D     deficiency: an Endocrine Society clinical practice    guideline. JCEM. 2011 Jul; 96(7):1911-30.   CMP14+EGFR     Status: Abnormal   Collection Time: 02/20/24  4:00 PM  Result Value Ref Range   Glucose 227 (H) 70 - 99 mg/dL   BUN 13 6 - 20 mg/dL   Creatinine, Ser 7.82 (H) 0.57 - 1.00 mg/dL   eGFR 66 >95 AO/ZHY/8.65   BUN/Creatinine Ratio 12 9 - 23   Sodium 135 134 - 144 mmol/L   Potassium 4.4 3.5 - 5.2 mmol/L   Chloride 100 96 - 106 mmol/L   CO2 23 20 - 29 mmol/L   Calcium  8.4 (L) 8.7 - 10.2 mg/dL   Total Protein 5.3 (L) 6.0 - 8.5 g/dL   Albumin 2.9 (L) 3.9 - 4.9 g/dL   Globulin, Total 2.4 1.5 - 4.5 g/dL   Bilirubin Total 0.3 0.0 - 1.2 mg/dL   Alkaline Phosphatase 101 44 - 121 IU/L   AST 10 0 - 40 IU/L   ALT 7 0 - 32 IU/L  TSH     Status: None   Collection Time: 02/20/24  4:00 PM  Result Value Ref Range   TSH 2.690 0.450 - 4.500 uIU/mL  Hemoglobin A1c     Status: Abnormal   Collection Time: 02/20/24  4:00 PM  Result Value Ref Range   Hgb A1c MFr Bld 8.4 (H) 4.8 - 5.6 %    Comment:          Prediabetes: 5.7 - 6.4          Diabetes: >6.4          Glycemic control for adults with diabetes: <7.0    Est. average glucose Bld gHb Est-mCnc 194 mg/dL  Vitamin B12     Status: None   Collection Time: 02/20/24  4:00 PM  Result Value Ref Range   Vitamin B-12 365 232 - 1,245 pg/mL  Iron, TIBC and Ferritin Panel     Status: Abnormal   Collection Time: 02/20/24   4:00 PM  Result Value Ref Range   Total Iron Binding Capacity 239 (L) 250 - 450 ug/dL   UIBC 784 696 - 295 ug/dL   Iron 74 27 - 284 ug/dL   Iron Saturation 31 15 - 55 %   Ferritin 109 15 - 150 ng/mL  FBN1: Marfan Syndrome     Status: None   Collection Time: 04/11/24 10:44 AM  Result Value Ref Range   Test Detail Comment     Comment: Variants of Uncertain Significance (VUS) Included     Ethnicity Comment     Comment: Not Provided     Specimen Type Comment     Comment: Whole Blood     Indication Comment     Comment: Suspected diagnosis     Result: Comment     Comment: NEGATIVE     Interpretation Comment     Comment: Negative Results  Disorders (Gene)    Result  Interpretation  Marfan Syndrome     NEGATIVE       No pathogenic FBN1 [id number]                   variants or variants                                    of uncertain                                    significance were                                    detected for the                                    genes analyzed. This                                    result does not                                    support a diagnosis                                    of, or a                                    predisposition to,                                    disorders related to                                    the gene tested.     Recommendations Comment     Comment: If the above result is positive, genetic counseling is recommended to discuss the potential clinical and/or reproductive implications, as well as recommendations for testing family members.     Additional Clinical Information Comment     Comment: Marfan syndrome is an autosomal dominant connective tissue disorder with variable severity and age at onset. It is caused by mutations in the FBN1 gene. Signs and symptoms of Marfan syndrome include disproportionate tall stature, long fingers and toes, flat feet, chest  deformities, cardiovascular defects, stretch marks, dislocation of the eye lens, spontaneous lung collapse, and aortic dissection. However, disease severity and individual phenotypes can be variable, even within families. De novo mutations are estimated to account for 25% of Marfan syndrome cases. Individuals with Marfan syndrome require medical surveillance by a multidiscplinary team of specialists. Treatment, including surgical intervention, is largely focused on management of symptoms, but drug therapies may be effective at preventing severe cardiovascular events in some affected individuals. Of note, mutations in FBN1 are also associated with other  autosomal dominant conditions, some of which have fe atures that overlap with Marfan syndrome. Dewitt Forehand, [id number]; [place], PMID: [id number]).     Comments Comment     Comment: This interpretation is based on the clinical information provided and the current understanding of the molecular genetics of the disorder(s) tested.     Methods/Limitations Comment     Comment: Next-generation Sequencing (NGS): Genomic regions of interest are selected using the [employer](R) hybridization capture method and sequenced via the Illumina(R) NGS platform. Sequencing reads are aligned to the human genome reference GRCh37/hg19 build. Regions of interest include coding exons, intron/exon junctions (typically +/- 20 nucleotides) and additional genomic regions with known significant pathogenic variants. Estimated analytical sensitivity is >99% for single nucleotide variants and insertions/deletions <45 base pairs. [employer] and in-house algorithms identify copy number variants (CNVs) by comparing normalized read depth for each target in the region of interest with a set of clinical control samples or to the median read depth across the samples within the same NGS run. The overall analytical sensitivity for CNV detection in DMD is  >99% and, based on simulation studies, the estimated analytical sensitivity for single exon deletions is >9 7% and for single exon duplications is >82%. For all other genes, the assay is designed to detect CNVs with genomic size >10 kb and typically involving two or more consecutive coding exons with an overall analytical sensitivity of >96%. Large single-exon deletions or duplications may be detected. Precise breakpoints are not reported. Confirmatory testing by orthogonal technologies may include Sanger sequencing, MLPA, gap PCR, or low coverage whole genome sequencing analysis.  If the following genes are included in this test, these analysis restrictions are applied: APOB includes only 556bp of exon 26; MED12 includes only the c.3020A>G (p.N1007S) variant; TBX1 excludes [id number] in exon 3; TTN excludes exons 154 and 155. Regions not included in CNV analysis: MYH6 exons 26 and 27, MYH7 exons 26 and 27, NF1 gene. Regions that may have lower analytical sensitivity due to intrinsic sequence properties: TGFBR1 exon 1.  Reported variants: Pathogenic and likel y pathogenic variants and variants of uncertain significance are reported for all tests. Benign and likely benign variants are not reported. Variants are specified using the numbering and nomenclature recommended by the [employer] Variation Society (HGVS, ViralSquad.com.cy). Variant classification and confirmation are consistent with ACMG standards and guidelines Gaylene Kays, GEXB:[id number] Gales, WNUU:[id number]). Detailed variant classification information and variant reevaluation are available upon request.  Limitations: Technologies used do not detect germline mosaicism and do not rule out the presence of large chromosomal aberrations including rearrangements and gene fusions, or variants in regions or genes not included in this test, or possible inter/intragenic interactions between variants, or repeat  expansions. Variant classification and/or interpretation may change over time if more information becomes available. False positive or false negative results may  occur for reasons that include: rare genetic variants, sex chromosome abnormalities, pseudogene interference, blood transfusions, bone marrow transplantation, somatic or tissue-specific mosaicism, mislabeled samples, or erroneous representation of family relationships.  This test was developed and its performance characteristics determined by [employer]. It has not been cleared or approved by the Food and Drug Administration.  [facility] is a subsidiary of [employer], using the brand [facility]. Inheritest(R) and GeneSeq(R) are registered service marks of [employer].     References Comment     Comment: Luella Sager, Bean [facility], Dorla Gartner et al. Next-generation sequencing for constitutional variants in  the clinical laboratory, 2021 revision: a technical standard of the [employer] of [employer] and Genomics [employer]). Genet Med 23, 1399 (2021). PMID: 86578469  Jonathan Neighbor et al. Standards and guidelines for the interpretation of sequence variants: a joint consensus recommendation of the [employer] of Medical Genetics and Genomics and the Association for Molecular Pathology. Genet Med 17, 405 (2015). PMID: 62952841     Director Review/Release Comment     Comment: Component Type      Performed At         Laboratory                                          Director  Technical           [facility]     Lindy Rhyme, PhD, component,          Laboratories, [place],   [facility] processing          [address],                     [place], [place],                     [address]  Technical           [facility]     Lindy Rhyme, PhD, component,          Laboratories, [place],    [facility] analysis            [address]                     [place], [place],                     [address]  Professional        [facility]     Lindy Rhyme, PhD, component           Laboratories, [place],   [facility]                     [address],                     [place], [place],                     [address]  Electronically released by Curvin Downing, PhD, [facility]    Calcitonin     Status: Abnormal   Collection Time: 04/11/24 10:44 AM  Result Value Ref Range   Calcitonin 19.6 (H) 0.0 - 5.0 pg/mL    Comment: Siemens Immulite 2000 Immunochemiluminometric assay (ICMA) Values obtained with different assay methods or kits cannot be used interchangeably. Results cannot be interpreted as absolute evidence of the presence or absence of malignant disease.   CMP14+EGFR     Status: Abnormal   Collection Time: 04/11/24 10:47 AM  Result Value Ref Range   Glucose 135 (H) 70 - 99 mg/dL   BUN 26 (H) 6 - 20 mg/dL   Creatinine, Ser 9.56 (H) 0.57 - 1.00 mg/dL   eGFR 44 (L) >38 VF/IEP/3.29  BUN/Creatinine Ratio 16 9 - 23   Sodium 141 134 - 144 mmol/L   Potassium 5.0 3.5 - 5.2 mmol/L   Chloride 107 (H) 96 - 106 mmol/L   CO2 20 20 - 29 mmol/L   Calcium  8.7 8.7 - 10.2 mg/dL   Total Protein 5.2 (L) 6.0 - 8.5 g/dL   Albumin 3.0 (L) 3.9 - 4.9 g/dL   Globulin, Total 2.2 1.5 - 4.5 g/dL   Bilirubin Total <7.5 0.0 - 1.2 mg/dL   Alkaline Phosphatase 82 44 - 121 IU/L   AST 12 0 - 40 IU/L   ALT 10 0 - 32 IU/L  CBC with Diff     Status: Abnormal   Collection Time: 04/11/24 10:47 AM  Result Value Ref Range   WBC 6.6 3.4 - 10.8 x10E3/uL   RBC 3.24 (L) 3.77 - 5.28 x10E6/uL   Hemoglobin 9.1 (L) 11.1 - 15.9 g/dL   Hematocrit 64.3 (L) 32.9 - 46.6 %   MCV 88 79 - 97 fL   MCH 28.1 26.6 - 33.0 pg   MCHC 31.8 31.5 - 35.7 g/dL   RDW 51.8 84.1 - 66.0 %   Platelets 300 150 - 450 x10E3/uL   Neutrophils 59 Not Estab. %   Lymphs 31 Not Estab. %   Monocytes 6 Not Estab. %   Eos 2  Not Estab. %   Basos 1 Not Estab. %   Neutrophils Absolute 4.0 1.4 - 7.0 x10E3/uL   Lymphocytes Absolute 2.0 0.7 - 3.1 x10E3/uL   Monocytes Absolute 0.4 0.1 - 0.9 x10E3/uL   EOS (ABSOLUTE) 0.1 0.0 - 0.4 x10E3/uL   Basophils Absolute 0.0 0.0 - 0.2 x10E3/uL   Immature Granulocytes 1 Not Estab. %   Immature Grans (Abs) 0.0 0.0 - 0.1 x10E3/uL       Assessment & Plan:   Problem List Items Addressed This Visit       Cardiovascular and Mediastinum   Type 2 diabetes mellitus with circulatory disorder, with long-term current use of insulin  (HCC) - Primary   Checking labs today. Will call pt. With results  Continue current diabetes POC, as patient has been well controlled on current regimen.  Will adjust meds if needed based on labs.        Relevant Orders   CMP14+EGFR (Completed)   Hemoglobin A1c (Completed)   Primary hypertension   Blood pressure well controlled with current medications.  Continue current therapy.  Will reassess at follow up.          Digestive   Diabetic gastroparesis associated with type 2 diabetes mellitus (HCC)   Relevant Orders   CMP14+EGFR (Completed)     Other   Mixed hyperlipidemia   Checking labs today.  Continue current therapy for lipid control. Will modify as needed based on labwork results.        Relevant Orders   Lipid panel (Completed)   CMP14+EGFR (Completed)   Other Visit Diagnoses       Other fatigue       Checking labs today.  Will continue supplements as needed.   Relevant Orders   CMP14+EGFR (Completed)   TSH (Completed)   Iron, TIBC and Ferritin Panel (Completed)     Vitamin D  deficiency, unspecified       Checking labs today.  Will continue supplements as needed.   Relevant Orders   VITAMIN D  25 Hydroxy (Vit-D Deficiency, Fractures) (Completed)   CMP14+EGFR (Completed)     Essential hypertension, benign  Relevant Orders   CMP14+EGFR (Completed)     B12 deficiency due to diet       Checking labs today.   Will continue supplements as needed.   Relevant Orders   CMP14+EGFR (Completed)   Vitamin B12 (Completed)     Abdominal pain, unspecified abdominal location       sending for OVA/Parasite testing and other stool tests to ensure that her issues are not the result of any underlying issue we have missed.   Relevant Orders   Ova and parasite examination   Culture, Stool   Pancreatic Elastase, Fecal     Abnormal stools       Relevant Orders   Ova and parasite examination   Culture, Stool   Pancreatic Elastase, Fecal        Return in about 1 month (around 03/22/2024) for F/U.   Total time spent: 30 minutes  Trenda Frisk, FNP  02/20/2024   This document may have been prepared by [facility] Voice Recognition software and as such may include unintentional dictation errors.

## 2024-02-21 DIAGNOSIS — E538 Deficiency of other specified B group vitamins: Secondary | ICD-10-CM | POA: Diagnosis not present

## 2024-02-21 LAB — VITAMIN B12: Vitamin B-12: 365 pg/mL (ref 232–1245)

## 2024-02-21 LAB — CMP14+EGFR
ALT: 7 IU/L (ref 0–32)
AST: 10 IU/L (ref 0–40)
Albumin: 2.9 g/dL — ABNORMAL LOW (ref 3.9–4.9)
Alkaline Phosphatase: 101 IU/L (ref 44–121)
BUN/Creatinine Ratio: 12 (ref 9–23)
BUN: 13 mg/dL (ref 6–20)
Bilirubin Total: 0.3 mg/dL (ref 0.0–1.2)
CO2: 23 mmol/L (ref 20–29)
Calcium: 8.4 mg/dL — ABNORMAL LOW (ref 8.7–10.2)
Chloride: 100 mmol/L (ref 96–106)
Creatinine, Ser: 1.13 mg/dL — ABNORMAL HIGH (ref 0.57–1.00)
Globulin, Total: 2.4 g/dL (ref 1.5–4.5)
Glucose: 227 mg/dL — ABNORMAL HIGH (ref 70–99)
Potassium: 4.4 mmol/L (ref 3.5–5.2)
Sodium: 135 mmol/L (ref 134–144)
Total Protein: 5.3 g/dL — ABNORMAL LOW (ref 6.0–8.5)
eGFR: 66 mL/min/{1.73_m2} (ref >59)

## 2024-02-21 LAB — IRON,TIBC AND FERRITIN PANEL
Ferritin: 109 ng/mL (ref 15–150)
Iron Saturation: 31 % (ref 15–55)
Iron: 74 ug/dL (ref 27–159)
Total Iron Binding Capacity: 239 ug/dL — ABNORMAL LOW (ref 250–450)
UIBC: 165 ug/dL (ref 131–425)

## 2024-02-21 LAB — LIPID PANEL
Chol/HDL Ratio: 5.9 ratio — ABNORMAL HIGH (ref 0.0–4.4)
Cholesterol, Total: 264 mg/dL — ABNORMAL HIGH (ref 100–199)
HDL: 45 mg/dL (ref >39)
LDL Chol Calc (NIH): 195 mg/dL — ABNORMAL HIGH (ref 0–99)
Triglycerides: 133 mg/dL (ref 0–149)
VLDL Cholesterol Cal: 24 mg/dL (ref 5–40)

## 2024-02-21 LAB — VITAMIN D 25 HYDROXY (VIT D DEFICIENCY, FRACTURES): Vit D, 25-Hydroxy: <4 ng/mL — ABNORMAL LOW (ref 30.0–100.0)

## 2024-02-21 LAB — TSH: TSH: 2.69 u[IU]/mL (ref 0.450–4.500)

## 2024-02-21 LAB — HEMOGLOBIN A1C
Est. average glucose Bld gHb Est-mCnc: 194 mg/dL
Hgb A1c MFr Bld: 8.4 % — ABNORMAL HIGH (ref 4.8–5.6)

## 2024-02-21 MED ORDER — CYANOCOBALAMIN 1000 MCG/ML IJ SOLN
1000.0000 ug | Freq: Once | INTRAMUSCULAR | Status: AC
  Administered 2024-02-21: 1000 ug via INTRAMUSCULAR

## 2024-03-14 ENCOUNTER — Other Ambulatory Visit: Payer: Self-pay

## 2024-03-14 MED ORDER — VITAMIN D (ERGOCALCIFEROL) 1.25 MG (50000 UNIT) PO CAPS: 50000.0000 [IU] | ORAL_CAPSULE | ORAL | 3 refills | Status: AC

## 2024-03-25 ENCOUNTER — Ambulatory Visit: Admitting: Family

## 2024-03-31 ENCOUNTER — Telehealth: Payer: Self-pay

## 2024-03-31 NOTE — Transitions of Care (Post Inpatient/ED Visit) (Signed)
   03/31/2024  Name: Katie Gilbert MRN: [id number] DOB: Dec 25, 1990  Today's TOC FU Call Status: Today's TOC FU Call Status:: Unsuccessful Call (1st Attempt) Unsuccessful Call (1st Attempt) Date: 03/31/24  Attempted to reach the patient regarding the most recent Inpatient/ED visit.  Follow Up Plan: Additional outreach attempts will be made to reach the patient to complete the Transitions of Care (Post Inpatient/ED visit) call.   Gareld June, BSN, RN [place]  VBCI - [employer] [facility] RN Care Manager [phone]

## 2024-03-31 NOTE — Assessment & Plan Note (Signed)
 Checking labs today.  Continue current therapy for lipid control. Will modify as needed based on labwork results.

## 2024-03-31 NOTE — Assessment & Plan Note (Signed)
 Despite her compliance and health issues, she is doing better with this than at her previous appointment.  Given how she is feeling today, I will wait until follow up to recheck her labs.

## 2024-03-31 NOTE — Assessment & Plan Note (Signed)
 Continue current meds.  Will adjust as needed based on results.  The patient is asked to make an attempt to improve diet and exercise patterns to aid in medical management of this problem. Addressed importance of increasing and maintaining water intake.

## 2024-03-31 NOTE — Assessment & Plan Note (Signed)
 Will resend GI referral for patient. Patient instructed to eat small meals multiple times a day and to try to get enough protein by utilizing protein drinks when possible.   Reassess at follow up.

## 2024-03-31 NOTE — Assessment & Plan Note (Signed)
 Patient stable.  Well controlled with current therapy.   Continue current meds.

## 2024-04-01 ENCOUNTER — Telehealth: Payer: Self-pay

## 2024-04-01 NOTE — Transitions of Care (Post Inpatient/ED Visit) (Signed)
   04/01/2024  Name: Katie Gilbert MRN: [id number] DOB: December 18, 1991  Today's TOC FU Call Status: Today's TOC FU Call Status:: Unsuccessful Call (2nd Attempt) Unsuccessful Call (1st Attempt) Date: 03/31/24 Unsuccessful Call (2nd Attempt) Date: 04/01/24  Attempted to reach the patient regarding the most recent Inpatient/ED visit.  Follow Up Plan: Additional outreach attempts will be made to reach the patient to complete the Transitions of Care (Post Inpatient/ED visit) call.   Gareld June, BSN, RN [place]  VBCI - [facility] RN Care Manager [phone]

## 2024-04-02 ENCOUNTER — Telehealth: Payer: Self-pay

## 2024-04-02 NOTE — Transitions of Care (Post Inpatient/ED Visit) (Signed)
   04/02/2024  Name: Katie Gilbert MRN: [id number] DOB: 07-29-1991  Today's TOC FU Call Status: Today's TOC FU Call Status:: Unsuccessful Call (3rd Attempt) Unsuccessful Call (1st Attempt) Date: 03/31/24 Unsuccessful Call (2nd Attempt) Date: 04/01/24 Unsuccessful Call (3rd Attempt) Date: 04/02/24  Attempted to reach the patient regarding the most recent Inpatient/ED visit.  Follow Up Plan: No further outreach attempts will be made at this time. We have been unable to contact the patient.  Gareld June, BSN, RN [place]  VBCI - [facility] RN Care Manager [phone]

## 2024-04-08 ENCOUNTER — Encounter: Payer: Self-pay | Admitting: Family

## 2024-04-08 ENCOUNTER — Ambulatory Visit: Admitting: Family

## 2024-04-08 VITALS — BP 116/84 | HR 95 | Ht 75.0 in | Wt 238.0 lb

## 2024-04-08 DIAGNOSIS — I77810 Thoracic aortic ectasia: Secondary | ICD-10-CM

## 2024-04-08 DIAGNOSIS — Z0130 Encounter for examination of blood pressure without abnormal findings: Secondary | ICD-10-CM

## 2024-04-08 DIAGNOSIS — E1143 Type 2 diabetes mellitus with diabetic autonomic (poly)neuropathy: Secondary | ICD-10-CM

## 2024-04-08 DIAGNOSIS — K3184 Gastroparesis: Secondary | ICD-10-CM | POA: Diagnosis not present

## 2024-04-08 MED ORDER — HYDROXYZINE PAMOATE 25 MG PO CAPS: 25.0000 mg | ORAL_CAPSULE | Freq: Three times a day (TID) | ORAL | 0 refills | Status: AC | PRN

## 2024-04-12 LAB — CMP14+EGFR
ALT: 10 IU/L (ref 0–32)
AST: 12 IU/L (ref 0–40)
Albumin: 3 g/dL — ABNORMAL LOW (ref 3.9–4.9)
Alkaline Phosphatase: 82 IU/L (ref 44–121)
BUN/Creatinine Ratio: 16 (ref 9–23)
BUN: 26 mg/dL — ABNORMAL HIGH (ref 6–20)
Bilirubin Total: <0.2 mg/dL (ref 0.0–1.2)
CO2: 20 mmol/L (ref 20–29)
Calcium: 8.7 mg/dL (ref 8.7–10.2)
Chloride: 107 mmol/L — ABNORMAL HIGH (ref 96–106)
Creatinine, Ser: 1.59 mg/dL — ABNORMAL HIGH (ref 0.57–1.00)
Globulin, Total: 2.2 g/dL (ref 1.5–4.5)
Glucose: 135 mg/dL — ABNORMAL HIGH (ref 70–99)
Potassium: 5 mmol/L (ref 3.5–5.2)
Sodium: 141 mmol/L (ref 134–144)
Total Protein: 5.2 g/dL — ABNORMAL LOW (ref 6.0–8.5)
eGFR: 44 mL/min/{1.73_m2} — ABNORMAL LOW (ref >59)

## 2024-04-12 LAB — CBC WITH DIFFERENTIAL/PLATELET
Basophils Absolute: 0 10*3/uL (ref 0.0–0.2)
Basos: 1 %
EOS (ABSOLUTE): 0.1 10*3/uL (ref 0.0–0.4)
Eos: 2 %
Hematocrit: 28.6 % — ABNORMAL LOW (ref 34.0–46.6)
Hemoglobin: 9.1 g/dL — ABNORMAL LOW (ref 11.1–15.9)
Immature Grans (Abs): 0 10*3/uL (ref 0.0–0.1)
Immature Granulocytes: 1 %
Lymphocytes Absolute: 2 10*3/uL (ref 0.7–3.1)
Lymphs: 31 %
MCH: 28.1 pg (ref 26.6–33.0)
MCHC: 31.8 g/dL (ref 31.5–35.7)
MCV: 88 fL (ref 79–97)
Monocytes Absolute: 0.4 10*3/uL (ref 0.1–0.9)
Monocytes: 6 %
Neutrophils Absolute: 4 10*3/uL (ref 1.4–7.0)
Neutrophils: 59 %
Platelets: 300 10*3/uL (ref 150–450)
RBC: 3.24 x10E6/uL — ABNORMAL LOW (ref 3.77–5.28)
RDW: 13.6 % (ref 11.7–15.4)
WBC: 6.6 10*3/uL (ref 3.4–10.8)

## 2024-04-21 LAB — FBN1: MARFAN SYNDROME

## 2024-04-21 LAB — CALCITONIN: Calcitonin: 19.6 pg/mL — ABNORMAL HIGH (ref 0.0–5.0)

## 2024-04-29 ENCOUNTER — Encounter: Payer: Self-pay | Admitting: Cardiology

## 2024-04-29 ENCOUNTER — Ambulatory Visit: Admitting: Cardiology

## 2024-04-29 VITALS — BP 122/76 | HR 96 | Ht 75.0 in | Wt 251.0 lb

## 2024-04-29 DIAGNOSIS — E1143 Type 2 diabetes mellitus with diabetic autonomic (poly)neuropathy: Secondary | ICD-10-CM

## 2024-04-29 DIAGNOSIS — Z0130 Encounter for examination of blood pressure without abnormal findings: Secondary | ICD-10-CM | POA: Diagnosis not present

## 2024-04-29 DIAGNOSIS — R21 Rash and other nonspecific skin eruption: Secondary | ICD-10-CM | POA: Diagnosis not present

## 2024-04-29 DIAGNOSIS — T7840XA Allergy, unspecified, initial encounter: Secondary | ICD-10-CM

## 2024-04-29 DIAGNOSIS — E1159 Type 2 diabetes mellitus with other circulatory complications: Secondary | ICD-10-CM

## 2024-04-29 MED ORDER — METHYLPREDNISOLONE 4 MG PO TBPK: ORAL_TABLET | ORAL | 0 refills | Status: DC

## 2024-04-29 NOTE — Progress Notes (Signed)
 Established Patient Office Visit  Subjective:  Patient ID: Katie Gilbert, female    DOB: 1991/07/27  Age: 33 y.o. MRN: [id number]  Chief Complaint  Patient presents with   Acute Visit    Allergic Reaction, Unsure what caused it, Nothing new in soaps, detergents, lotions, medications or foods    Patient in office for an acute visit, complaining of an allergic reaction. Patient reports waking up two days ago with her face swollen. Denies changes in soaps, detergents, or food intake. Patient denies shortness of breath, trouble swallowing, itching or rash.  Will send in a prednisone pack. Recommend taking Benadryl  as directed on the package.   Allergic Reaction This is a new problem. The current episode started 2 days ago. The problem occurs constantly. The problem has been gradually worsening since onset. The problem is moderate. It is unknown what she was exposed to. Pertinent negatives include no abdominal pain, chest pain, diarrhea, difficulty breathing, itching, rash or trouble swallowing. Swelling is present on the face. Past treatments include nothing. The treatment provided no relief. There is no history of asthma, food allergies or medication allergies.    No other concerns at this time.   Past Medical History:  Diagnosis Date   C. difficile enteritis 07/24/2021   Diarrhea 07/22/2021   Genital herpes    Nausea & vomiting 07/22/2021   Type II diabetes mellitus with renal manifestations [facility])     Past Surgical History:  Procedure Laterality Date   FOOT SURGERY Right    I & D EXTREMITY Right 01/17/2019   Procedure: IRRIGATION AND DEBRIDEMENT RIGHT FOOT;  Surgeon: Anell Baptist, DPM;  Location: [facility] ORS;  Service: Podiatry;  Laterality: Right;    Social History   Socioeconomic History   Marital status: Single    Spouse name: Not on file   Number of children: Not on file   Years of education: Not on file   Highest education level: Not on file  Occupational History    Not on file  Tobacco Use   Smoking status: Light Smoker    Current packs/day: 0.20    Types: Cigarettes   Smokeless tobacco: Never  Vaping Use   Vaping status: Never Used  Substance and Sexual Activity   Alcohol use: No   Drug use: No   Sexual activity: Not on file  Other Topics Concern   Not on file  Social History Narrative   Not on file   Social Drivers of Health   Financial Resource Strain: Low Risk  (09/08/2021)   Received from [facility], [facility]   Overall Financial Resource Strain (CARDIA)    Difficulty of Paying Living Expenses: Not hard at all  Food Insecurity: No Food Insecurity (09/08/2021)   Received from [facility], [facility]   Hunger Vital Sign    Worried About Running Out of Food in the Last Year: Never true    Ran Out of Food in the Last Year: Never true  Transportation Needs: No Transportation Needs (09/08/2021)   Received from [facility], [facility]   [facility] - Transportation    Lack of Transportation (Medical): No    Lack of Transportation (Non-Medical): No  Physical Activity: Inactive (07/23/2021)   Received from [facility], [facility]   Exercise Vital Sign    Days of Exercise per Week: 0 days    Minutes of Exercise per Session: 0 min  Stress: No Stress Concern Present (07/23/2021)  Received from [facility], [facility]   [facility] - Occupational Stress Questionnaire    Feeling of Stress : Only a little  Social Connections: Moderately Isolated (07/23/2021)   Received from [facility], [facility]   Social Connection and Isolation Panel [NHANES]    Frequency of Communication with Friends and Family: More than three times a week    Frequency of Social Gatherings with Friends and Family: More than three times a week    Attends Religious Services: 1 to 4 times per year    Active Member of [employer] or Organizations: No    Attends Banker Meetings: Never     Marital Status: Never married  Catering manager Violence: Not At Risk (07/23/2021)   Received from [facility], [facility]   Humiliation, Afraid, Rape, and Kick questionnaire    Fear of Current or Ex-Partner: No    Emotionally Abused: No    Physically Abused: No    Sexually Abused: No    Family History  Problem Relation Age of Onset   Hypertension Father    Heart attack Father    Diabetes Father    Diabetes Maternal Grandmother     Allergies  Allergen Reactions   Haloperidol Other (See Comments)    Dystonic reaction  Neck spasms    Outpatient Medications Prior to Visit  Medication Sig   blood glucose meter kit and supplies KIT Dispense based on patient and insurance preference. Use up to four times daily as directed. (FOR ICD-9 250.00, 250.01). (Patient not taking: Reported on 10/05/2023)   busPIRone  (BUSPAR ) 5 MG tablet Take 1 tablet (5 mg total) by mouth 3 (three) times daily.   chlorthalidone (HYGROTON) 25 MG tablet Take 1 tablet by mouth daily.   DULoxetine  (CYMBALTA ) 20 MG capsule Take 1 capsule (20 mg total) by mouth daily. (Patient not taking: Reported on 10/05/2023)   ferrous sulfate  325 (65 FE) MG EC tablet Take 325 mg by mouth every other day.   furosemide  (LASIX ) 20 MG tablet Take one tablet every morning for 4 days, then take as needed (Patient not taking: Reported on 04/08/2024)   hydrochlorothiazide  (HYDRODIURIL ) 12.5 MG tablet Take 1 tablet (12.5 mg total) by mouth daily. (Patient not taking: Reported on 10/05/2023)   hydrOXYzine  (VISTARIL ) 25 MG capsule Take 1 capsule (25 mg total) by mouth 3 (three) times daily as needed for anxiety (insomnia).   lisinopril (ZESTRIL) 10 MG tablet Take 1 tablet by mouth daily.   metFORMIN  (GLUCOPHAGE ) 500 MG tablet Take 500 mg by mouth 2 (two) times daily with a meal.   metoCLOPramide  (REGLAN ) 10 MG tablet Take 1 tablet (10 mg total) by mouth 3 (three) times daily with meals. (Patient not taking: Reported on 10/05/2023)    prochlorperazine  (COMPAZINE ) 5 MG tablet Take 1 tablet (5 mg total) by mouth every 6 (six) hours as needed for nausea or vomiting. (Patient not taking: Reported on 10/05/2023)   rosuvastatin  (CRESTOR ) 5 MG tablet Take 1 tablet (5 mg total) by mouth daily. (Patient not taking: Reported on 10/05/2023)   tirzepatide (MOUNJARO) 2.5 MG/0.5ML Pen Inject 2.5 mg into the skin once a week. (Patient not taking: Reported on 10/05/2023)   Vitamin D , Ergocalciferol , (DRISDOL ) 1.25 MG (50000 UNIT) CAPS capsule Take 1 capsule (50,000 Units total) by mouth every 7 (seven) days.   No facility-administered medications prior to visit.    Review of Systems  Constitutional: Negative.   HENT: Negative.  Negative for trouble swallowing.   Eyes: Negative.   Respiratory: Negative.  Negative for shortness of breath.   Cardiovascular: Negative.  Negative for chest pain.  Gastrointestinal: Negative.  Negative for abdominal pain, constipation and diarrhea.  Genitourinary: Negative.   Musculoskeletal:  Negative for joint pain and myalgias.  Skin: Negative.  Negative for itching and rash.  Neurological: Negative.  Negative for dizziness and headaches.  Endo/Heme/Allergies: Negative.   All other systems reviewed and are negative.      Objective:   BP 122/76   Pulse 96   Ht 6\' 3"  (1.905 m)   Wt 251 lb (113.9 kg)   SpO2 98%   BMI 31.37 kg/m   Vitals:   04/29/24 1004  BP: 122/76  Pulse: 96  Height: 6\' 3"  (1.905 m)  Weight: 251 lb (113.9 kg)  SpO2: 98%  BMI (Calculated): 31.37    Physical Exam Vitals and nursing note reviewed.  Constitutional:      Appearance: Normal appearance. She is normal weight.  HENT:     Head: Normocephalic and atraumatic.     Nose: Nose normal.     Mouth/Throat:     Mouth: Mucous membranes are moist.  Eyes:     Extraocular Movements: Extraocular movements intact.     Conjunctiva/sclera: Conjunctivae normal.     Pupils: Pupils are equal, round, and reactive to light.   Cardiovascular:     Rate and Rhythm: Normal rate and regular rhythm.     Pulses: Normal pulses.     Heart sounds: Normal heart sounds.  Pulmonary:     Effort: Pulmonary effort is normal.     Breath sounds: Normal breath sounds.  Abdominal:     General: Abdomen is flat. Bowel sounds are normal.     Palpations: Abdomen is soft.  Musculoskeletal:        General: Normal range of motion.     Cervical back: Normal range of motion.  Skin:    General: Skin is warm and dry.  Neurological:     General: No focal deficit present.     Mental Status: She is alert and oriented to person, place, and time.  Psychiatric:        Mood and Affect: Mood normal.        Behavior: Behavior normal.        Thought Content: Thought content normal.        Judgment: Judgment normal.      No results found for any visits on 04/29/24.  Recent Results (from the past 2160 hours)  Lipid panel     Status: Abnormal   Collection Time: 02/20/24  4:00 PM  Result Value Ref Range   Cholesterol, Total 264 (H) 100 - 199 mg/dL   Triglycerides 161 0 - 149 mg/dL   HDL 45 >09 mg/dL   VLDL Cholesterol Cal 24 5 - 40 mg/dL   LDL Chol Calc (NIH) 604 (H) 0 - 99 mg/dL   LDL CALC COMMENT: Comment     Comment: Consider evaluating for Familial Hypercholesterolemia(FH), if clinically indicated.    Chol/HDL Ratio 5.9 (H) 0.0 - 4.4 ratio    Comment:                                   T. Chol/HDL Ratio  Men  Women                               1/2 Avg.Risk  3.4    3.3                                   Avg.Risk  5.0    4.4                                2X Avg.Risk  9.6    7.1                                3X Avg.Risk 23.4   11.0   VITAMIN D  25 Hydroxy (Vit-D Deficiency, Fractures)     Status: Abnormal   Collection Time: 02/20/24  4:00 PM  Result Value Ref Range   Vit D, 25-Hydroxy <4.0 (L) 30.0 - 100.0 ng/mL    Comment: Vitamin D  deficiency has been defined by the Institute  of Medicine and an Endocrine Society practice guideline as a level of serum 25-OH vitamin D  less than 20 ng/mL (1,2). The Endocrine Society went on to further define vitamin D  insufficiency as a level between 21 and 29 ng/mL (2). 1. IOM (Institute of Medicine). 2010. Dietary reference    intakes for calcium  and D. [place]: The    [employer]. 2. Holick MF, Binkley [place], Bischoff-Ferrari HA, et al.    Evaluation, treatment, and prevention of vitamin D     deficiency: an Endocrine Society clinical practice    guideline. JCEM. 2011 Jul; 96(7):1911-30.   CMP14+EGFR     Status: Abnormal   Collection Time: 02/20/24  4:00 PM  Result Value Ref Range   Glucose 227 (H) 70 - 99 mg/dL   BUN 13 6 - 20 mg/dL   Creatinine, Ser 6.57 (H) 0.57 - 1.00 mg/dL   eGFR 66 >84 ON/GEX/5.28   BUN/Creatinine Ratio 12 9 - 23   Sodium 135 134 - 144 mmol/L   Potassium 4.4 3.5 - 5.2 mmol/L   Chloride 100 96 - 106 mmol/L   CO2 23 20 - 29 mmol/L   Calcium  8.4 (L) 8.7 - 10.2 mg/dL   Total Protein 5.3 (L) 6.0 - 8.5 g/dL   Albumin 2.9 (L) 3.9 - 4.9 g/dL   Globulin, Total 2.4 1.5 - 4.5 g/dL   Bilirubin Total 0.3 0.0 - 1.2 mg/dL   Alkaline Phosphatase 101 44 - 121 IU/L   AST 10 0 - 40 IU/L   ALT 7 0 - 32 IU/L  TSH     Status: None   Collection Time: 02/20/24  4:00 PM  Result Value Ref Range   TSH 2.690 0.450 - 4.500 uIU/mL  Hemoglobin A1c     Status: Abnormal   Collection Time: 02/20/24  4:00 PM  Result Value Ref Range   Hgb A1c MFr Bld 8.4 (H) 4.8 - 5.6 %    Comment:          Prediabetes: 5.7 - 6.4          Diabetes: >6.4          Glycemic control for adults with diabetes: <7.0    Est. average glucose Bld gHb Est-mCnc 194 mg/dL  Vitamin B12  Status: None   Collection Time: 02/20/24  4:00 PM  Result Value Ref Range   Vitamin B-12 365 232 - 1,245 pg/mL  Iron, TIBC and Ferritin Panel     Status: Abnormal   Collection Time: 02/20/24  4:00 PM  Result Value Ref Range   Total Iron Binding  Capacity 239 (L) 250 - 450 ug/dL   UIBC 161 096 - 045 ug/dL   Iron 74 27 - 409 ug/dL   Iron Saturation 31 15 - 55 %   Ferritin 109 15 - 150 ng/mL  FBN1: Marfan Syndrome     Status: None   Collection Time: 04/11/24 10:44 AM  Result Value Ref Range   Test Detail Comment     Comment: Variants of Uncertain Significance (VUS) Included     Ethnicity Comment     Comment: Not Provided     Specimen Type Comment     Comment: Whole Blood     Indication Comment     Comment: Suspected diagnosis     Result: Comment     Comment: NEGATIVE     Interpretation Comment     Comment: Negative Results  Disorders (Gene)    Result         Interpretation  Marfan Syndrome     NEGATIVE       No pathogenic FBN1 [id number]                   variants or variants                                    of uncertain                                    significance were                                    detected for the                                    genes analyzed. This                                    result does not                                    support a diagnosis                                    of, or a                                    predisposition to,                                    disorders related to  the gene tested.     Recommendations Comment     Comment: If the above result is positive, genetic counseling is recommended to discuss the potential clinical and/or reproductive implications, as well as recommendations for testing family members.     Additional Clinical Information Comment     Comment: Marfan syndrome is an autosomal dominant connective tissue disorder with variable severity and age at onset. It is caused by mutations in the FBN1 gene. Signs and symptoms of Marfan syndrome include disproportionate tall stature, long fingers and toes, flat feet, chest deformities, cardiovascular defects, stretch marks, dislocation of  the eye lens, spontaneous lung collapse, and aortic dissection. However, disease severity and individual phenotypes can be variable, even within families. De novo mutations are estimated to account for 25% of Marfan syndrome cases. Individuals with Marfan syndrome require medical surveillance by a multidiscplinary team of specialists. Treatment, including surgical intervention, is largely focused on management of symptoms, but drug therapies may be effective at preventing severe cardiovascular events in some affected individuals. Of note, mutations in FBN1 are also associated with other autosomal dominant conditions, some of which have fe atures that overlap with Marfan syndrome. Dewitt Forehand, [id number]; [place], PMID: [id number]).     Comments Comment     Comment: This interpretation is based on the clinical information provided and the current understanding of the molecular genetics of the disorder(s) tested.     Methods/Limitations Comment     Comment: Next-generation Sequencing (NGS): Genomic regions of interest are selected using the [employer](R) hybridization capture method and sequenced via the Illumina(R) NGS platform. Sequencing reads are aligned to the human genome reference GRCh37/hg19 build. Regions of interest include coding exons, intron/exon junctions (typically +/- 20 nucleotides) and additional genomic regions with known significant pathogenic variants. Estimated analytical sensitivity is >99% for single nucleotide variants and insertions/deletions <45 base pairs. [employer] and in-house algorithms identify copy number variants (CNVs) by comparing normalized read depth for each target in the region of interest with a set of clinical control samples or to the median read depth across the samples within the same NGS run. The overall analytical sensitivity for CNV detection in DMD is >99% and, based on simulation studies, the estimated  analytical sensitivity for single exon deletions is >9 7% and for single exon duplications is >82%. For all other genes, the assay is designed to detect CNVs with genomic size >10 kb and typically involving two or more consecutive coding exons with an overall analytical sensitivity of >96%. Large single-exon deletions or duplications may be detected. Precise breakpoints are not reported. Confirmatory testing by orthogonal technologies may include Sanger sequencing, MLPA, gap PCR, or low coverage whole genome sequencing analysis.  If the following genes are included in this test, these analysis restrictions are applied: APOB includes only 556bp of exon 26; MED12 includes only the c.3020A>G (p.N1007S) variant; TBX1 excludes [id number] in exon 3; TTN excludes exons 154 and 155. Regions not included in CNV analysis: MYH6 exons 26 and 27, MYH7 exons 26 and 27, NF1 gene. Regions that may have lower analytical sensitivity due to intrinsic sequence properties: TGFBR1 exon 1.  Reported variants: Pathogenic and likel y pathogenic variants and variants of uncertain significance are reported for all tests. Benign and likely benign variants are not reported. Variants are specified using the numbering and nomenclature recommended by the [employer] Variation Society (HGVS, ViralSquad.com.cy). Variant classification and confirmation are consistent with ACMG standards and guidelines Gaylene Kays, [id number] Gales, BMWU:[id number]). Detailed variant classification information and variant  reevaluation are available upon request.  Limitations: Technologies used do not detect germline mosaicism and do not rule out the presence of large chromosomal aberrations including rearrangements and gene fusions, or variants in regions or genes not included in this test, or possible inter/intragenic interactions between variants, or repeat expansions. Variant classification and/or interpretation  may change over time if more information becomes available. False positive or false negative results may  occur for reasons that include: rare genetic variants, sex chromosome abnormalities, pseudogene interference, blood transfusions, bone marrow transplantation, somatic or tissue-specific mosaicism, mislabeled samples, or erroneous representation of family relationships.  This test was developed and its performance characteristics determined by [employer]. It has not been cleared or approved by the Food and Drug Administration.  [facility] [employer] is a subsidiary of [employer], using the brand [facility]. Inheritest(R) and GeneSeq(R) are registered service marks of [employer].     References Comment     Comment: Luella Sager, Bean LJ[facility], Dorla Gartner et al. Next-generation sequencing for constitutional variants in the clinical laboratory, 2021 revision: a technical standard of the [employer] of [employer] and Genomics (ACMG). Genet Med 23, 1399 (2021). PMID: 40981191  Jonathan Neighbor et al. Standards and guidelines for the interpretation of sequence variants: a joint consensus recommendation of the [employer] of Medical Genetics and Genomics and the Association for Molecular Pathology. Genet Med 17, 405 (2015). PMID: 47829562     Director Review/Release Comment     Comment: Component Type      Performed At         Laboratory                                          Director  Technical           [facility]     Lindy Rhyme, PhD, component,          Laboratories, [place],   [facility] processing          [address]                     [place], [place],                     [address]  Technical           [facility]     Lindy Rhyme, PhD, component,          Laboratories, [place],   [facility] analysis            [address]                     [place], [place],                     [address]  Professional        [facility]     Lindy Rhyme, PhD, component           Laboratories, [place],   [place]  [address]                     [place], [place],                     [address]  Electronically released by Curvin Downing, PhD, [facility]    Calcitonin     Status: Abnormal   Collection Time: 04/11/24 10:44 AM  Result Value Ref Range   Calcitonin 19.6 (H) 0.0 - 5.0 pg/mL    Comment: Siemens Immulite 2000 Immunochemiluminometric assay (ICMA) Values obtained with different assay methods or kits cannot be used interchangeably. Results cannot be interpreted as absolute evidence of the presence or absence of malignant disease.   CMP14+EGFR     Status: Abnormal   Collection Time: 04/11/24 10:47 AM  Result Value Ref Range   Glucose 135 (H) 70 - 99 mg/dL   BUN 26 (H) 6 - 20 mg/dL   Creatinine, Ser 6.04 (H) 0.57 - 1.00 mg/dL   eGFR 44 (L) >54 UJ/WJX/9.14   BUN/Creatinine Ratio 16 9 - 23   Sodium 141 134 - 144 mmol/L   Potassium 5.0 3.5 - 5.2 mmol/L   Chloride 107 (H) 96 - 106 mmol/L   CO2 20 20 - 29 mmol/L   Calcium  8.7 8.7 - 10.2 mg/dL   Total Protein 5.2 (L) 6.0 - 8.5 g/dL   Albumin 3.0 (L) 3.9 - 4.9 g/dL   Globulin, Total 2.2 1.5 - 4.5 g/dL   Bilirubin Total <7.8 0.0 - 1.2 mg/dL   Alkaline Phosphatase 82 44 - 121 IU/L   AST 12 0 - 40 IU/L   ALT 10 0 - 32 IU/L  CBC with Diff     Status: Abnormal   Collection Time: 04/11/24 10:47 AM  Result Value Ref Range   WBC 6.6 3.4 - 10.8 x10E3/uL   RBC 3.24 (L) 3.77 - 5.28 x10E6/uL   Hemoglobin 9.1 (L) 11.1 - 15.9 g/dL   Hematocrit 29.5 (L) 62.1 - 46.6 %   MCV 88 79 - 97 fL   MCH 28.1 26.6 - 33.0 pg   MCHC 31.8 31.5 - 35.7 g/dL   RDW 30.8 65.7 - 84.6 %   Platelets 300 150 - 450 x10E3/uL   Neutrophils 59 Not Estab. %   Lymphs 31 Not Estab. %   Monocytes 6 Not Estab. %   Eos 2 Not Estab. %   Basos 1 Not Estab. %   Neutrophils  Absolute 4.0 1.4 - 7.0 x10E3/uL   Lymphocytes Absolute 2.0 0.7 - 3.1 x10E3/uL   Monocytes Absolute 0.4 0.1 - 0.9 x10E3/uL   EOS (ABSOLUTE) 0.1 0.0 - 0.4 x10E3/uL   Basophils Absolute 0.0 0.0 - 0.2 x10E3/uL   Immature Granulocytes 1 Not Estab. %   Immature Grans (Abs) 0.0 0.0 - 0.1 x10E3/uL      Assessment & Plan:  Medrol dose pack Benadryl  as directed on the package  Problem List Items Addressed This Visit       Other   Allergic reaction - Primary    Return if symptoms worsen or fail to improve.   Total time spent: 25 minutes  [employer], NP  04/29/2024   This document may have been prepared by Dragon Voice Recognition software and as such may include unintentional dictation errors.

## 2024-05-09 ENCOUNTER — Encounter: Payer: Self-pay | Admitting: Family

## 2024-05-09 ENCOUNTER — Ambulatory Visit: Admitting: Family

## 2024-05-09 VITALS — BP 154/90 | HR 95 | Ht 75.0 in | Wt 271.0 lb

## 2024-05-09 DIAGNOSIS — E1159 Type 2 diabetes mellitus with other circulatory complications: Secondary | ICD-10-CM

## 2024-05-09 DIAGNOSIS — E1143 Type 2 diabetes mellitus with diabetic autonomic (poly)neuropathy: Secondary | ICD-10-CM

## 2024-05-09 DIAGNOSIS — K3184 Gastroparesis: Secondary | ICD-10-CM | POA: Diagnosis not present

## 2024-05-09 DIAGNOSIS — Z794 Long term (current) use of insulin: Secondary | ICD-10-CM

## 2024-05-09 NOTE — Patient Instructions (Addendum)
[phone] [facility]

## 2024-05-10 ENCOUNTER — Encounter: Payer: Self-pay | Admitting: Family

## 2024-05-10 NOTE — Assessment & Plan Note (Signed)
 Checking labs today.  Continue current therapy for lipid control. Will modify as needed based on labwork results.

## 2024-05-10 NOTE — Assessment & Plan Note (Signed)
 Checking labs today. Will call pt. With results  Continue current diabetes POC, as patient has been well controlled on current regimen.  Will adjust meds if needed based on labs.

## 2024-05-10 NOTE — Assessment & Plan Note (Signed)
 Blood pressure well controlled with current medications.  Continue current therapy.  Will reassess at follow up.

## 2024-05-17 LAB — ANTI-ISLET CELL ANTIBODY: Islet Cell Ab: NEGATIVE

## 2024-05-17 LAB — INSULIN ANTIBODIES, BLOOD: Insulin AutoAb: <5 uU/mL

## 2024-05-17 LAB — GLUTAMIC ACID DECARBOXYLASE AUTO ABS: Glutamic Acid Decarb Ab: <5 U/mL (ref 0.0–5.0)

## 2024-05-17 LAB — INSULIN, RANDOM: INSULIN: 8.6 u[IU]/mL (ref 2.6–24.9)

## 2024-05-26 LAB — MICROALBUMIN / CREATININE URINE RATIO: Microalb Creat Ratio: 4390.7

## 2024-05-30 ENCOUNTER — Ambulatory Visit: Payer: Self-pay

## 2024-07-16 ENCOUNTER — Encounter: Payer: Self-pay | Admitting: Family

## 2024-07-16 ENCOUNTER — Ambulatory Visit: Admitting: Family

## 2024-07-24 ENCOUNTER — Ambulatory Visit: Admitting: Family

## 2024-07-24 ENCOUNTER — Encounter: Payer: Self-pay | Admitting: Family

## 2024-07-27 ENCOUNTER — Encounter: Payer: Self-pay | Admitting: Family

## 2024-07-27 NOTE — Progress Notes (Signed)
 Established Patient Office Visit  Subjective:  Patient ID: Katie Gilbert, female    DOB: 1991-07-07  Age: 33 y.o. MRN: [id number]  Chief Complaint  Patient presents with   Follow-up    1 month follow up    Patient is here today for her 1 month follow up.  She has been feeling poorly since last appointment.   She does have additional concerns to discuss today.  She is feeling somewhat better, but still having severe bouts of fatigue, pain in her legs, etc.  She needs referral to GI at [facility] for motility issues, we will send to the appropriate location.   Labs are not due today.  She needs refills.   I have reviewed her active problem list, medication list, allergies, notes from last encounter, lab results for her appointment today.      No other concerns at this time.   Past Medical History:  Diagnosis Date   C. difficile enteritis 07/24/2021   Diarrhea 07/22/2021   Genital herpes    Nausea & vomiting 07/22/2021   Type II diabetes mellitus with renal manifestations [facility])     Past Surgical History:  Procedure Laterality Date   FOOT SURGERY Right    I & D EXTREMITY Right 01/17/2019   Procedure: IRRIGATION AND DEBRIDEMENT RIGHT FOOT;  Surgeon: Ashley Soulier, DPM;  Location: [facility] ORS;  Service: Podiatry;  Laterality: Right;    Social History   Socioeconomic History   Marital status: Single    Spouse name: Not on file   Number of children: Not on file   Years of education: Not on file   Highest education level: Not on file  Occupational History   Not on file  Tobacco Use   Smoking status: Light Smoker    Current packs/day: 0.20    Types: Cigarettes   Smokeless tobacco: Never  Vaping Use   Vaping status: Never Used  Substance and Sexual Activity   Alcohol use: No   Drug use: No   Sexual activity: Not on file  Other Topics Concern   Not on file  Social History Narrative   Not on file   Social Drivers of Health   Financial Resource Strain: Low Risk   (09/08/2021)   Received from [facility]   Overall Financial Resource Strain (CARDIA)    Difficulty of Paying Living Expenses: Not hard at all  Food Insecurity: No Food Insecurity (09/08/2021)   Received from [facility]   Hunger Vital Sign    Within the past 12 months, you worried that your food would run out before you got the money to buy more.: Never true    Within the past 12 months, the food you bought just didn't last and you didn't have money to get more.: Never true  Transportation Needs: No Transportation Needs (09/08/2021)   Received from [facility]   PRAPARE - Transportation    Lack of Transportation (Medical): No    Lack of Transportation (Non-Medical): No  Physical Activity: Inactive (07/23/2021)   Received from [facility]   Exercise Vital Sign    On average, how many days per week do you engage in moderate to strenuous exercise (like a brisk walk)?: 0 days    On average, how many minutes do you engage in exercise at this level?: 0 min  Stress: No Stress Concern Present (07/23/2021)   Received from [facility] - Occupational Stress Questionnaire  Feeling of Stress : Only a little  Social Connections: Moderately Isolated (07/23/2021)   Received from [facility]   Social Connection and Isolation Panel    In a typical week, how many times do you talk on the phone with family, friends, or neighbors?: More than three times a week    How often do you get together with friends or relatives?: More than three times a week    How often do you attend church or religious services?: 1 to 4 times per year    Do you belong to any clubs or organizations such as church groups, unions, fraternal or athletic groups, or school groups?: No    How often do you attend meetings of the clubs or organizations you belong to?: Never    Are you married, widowed, divorced, separated, never married, or living with a partner?: Never married   Intimate Partner Violence: Not At Risk (07/23/2021)   Received from [facility]   Humiliation, Afraid, Rape, and Kick questionnaire    Within the last year, have you been afraid of your partner or ex-partner?: No    Within the last year, have you been humiliated or emotionally abused in other ways by your partner or ex-partner?: No    Within the last year, have you been kicked, hit, slapped, or otherwise physically hurt by your partner or ex-partner?: No    Within the last year, have you been raped or forced to have any kind of sexual activity by your partner or ex-partner?: No    Family History  Problem Relation Age of Onset   Hypertension Father    Heart attack Father    Diabetes Father    Diabetes Maternal Grandmother     Allergies  Allergen Reactions   Haloperidol Other (See Comments)    Dystonic reaction  Neck spasms    Review of Systems  Constitutional:  Positive for malaise/fatigue.  Gastrointestinal:  Positive for abdominal pain, nausea and vomiting.  All other systems reviewed and are negative.      Objective:   BP (!) 154/90   Pulse 95   Ht 6' 3 (1.905 m)   Wt 271 lb (122.9 kg)   SpO2 99%   BMI 33.87 kg/m   Vitals:   05/09/24 1110  BP: (!) 154/90  Pulse: 95  Height: 6' 3 (1.905 m)  Weight: 271 lb (122.9 kg)  SpO2: 99%  BMI (Calculated): 33.87    Physical Exam Vitals and nursing note reviewed.  Constitutional:      Appearance: Normal appearance. She is normal weight.  HENT:     Head: Normocephalic.  Eyes:     Extraocular Movements: Extraocular movements intact.     Conjunctiva/sclera: Conjunctivae normal.     Pupils: Pupils are equal, round, and reactive to light.  Cardiovascular:     Rate and Rhythm: Normal rate.  Pulmonary:     Effort: Pulmonary effort is normal.  Neurological:     General: No focal deficit present.     Mental Status: She is alert and oriented to person, place, and time. Mental status is at baseline.  Psychiatric:         Mood and Affect: Mood normal.        Behavior: Behavior normal.        Thought Content: Thought content normal.        Judgment: Judgment normal.      Results for orders placed or performed in visit on 05/09/24  Insulin ,  random(561)  Result Value Ref Range   INSULIN  8.6 2.6 - 24.9 uIU/mL  Glutamic acid decarboxylase auto abs  Result Value Ref Range   Glutamic Acid Decarb Ab <5.0 0.0 - 5.0 U/mL  Insulin  [employer]  Result Value Ref Range   Insulin  AutoAb <5.0 uU/mL  Anti-islet cell antibody  Result Value Ref Range   Islet Cell Ab Negative Neg:<1:1    Recent Results (from the past 2160 hours)  Insulin , random(561)     Status: None   Collection Time: 05/09/24 12:10 PM  Result Value Ref Range   INSULIN  8.6 2.6 - 24.9 uIU/mL  Glutamic acid decarboxylase auto abs     Status: None   Collection Time: 05/09/24 12:10 PM  Result Value Ref Range   Glutamic Acid Decarb Ab <5.0 0.0 - 5.0 U/mL  Insulin  [employer]     Status: None   Collection Time: 05/09/24 12:10 PM  Result Value Ref Range   Insulin  AutoAb <5.0 uU/mL    Comment: This test is also known as insulin  autoantibody or IAA.  This test was developed and its performance characteristics determined by [facility]. It has not been cleared or approved by the Food and Drug Administration. Reference Range: <5.0        Negative > or = 5.0  Positive   Anti-islet cell antibody     Status: None   Collection Time: 05/09/24 12:10 PM  Result Value Ref Range   Islet Cell Ab Negative Neg:<1:1       Assessment & Plan Type 2 diabetes mellitus with other circulatory complication, with long-term current use of insulin  (HCC) Checking labs today, so we can ensure this is type 2 rather than type 1.   Will call pt. With results  Continue current diabetes POC, as patient has been well controlled on current regimen.  Will adjust meds if needed based on labs.   Diabetic gastroparesis associated with type 2 diabetes mellitus  (HCC) Will send referral to GI at [facility] clinic.      Return in about 2 months (around 07/09/2024) for F/U.   Total time spent: 20 minutes  ALAN CHRISTELLA ARRANT, FNP  05/09/2024   This document may have been prepared by [facility] Voice Recognition software and as such may include unintentional dictation errors.

## 2024-07-27 NOTE — Assessment & Plan Note (Signed)
 Will send referral to GI at [facility] clinic.

## 2024-07-27 NOTE — Assessment & Plan Note (Signed)
 Checking labs today, so we can ensure this is type 2 rather than type 1.   Will call pt. With results  Continue current diabetes POC, as patient has been well controlled on current regimen.  Will adjust meds if needed based on labs.

## 2024-09-08 ENCOUNTER — Telehealth: Payer: Self-pay | Admitting: Family

## 2024-09-08 NOTE — Telephone Encounter (Signed)
 Patient left VM requesting a call back about a form she needs to turn in.

## 2024-09-23 ENCOUNTER — Ambulatory Visit: Admitting: Family

## 2024-10-01 ENCOUNTER — Telehealth: Payer: Self-pay | Admitting: Family

## 2024-10-01 NOTE — Telephone Encounter (Signed)
 Patient's mother called in because patient is working and unable to call. She states the patient is having throbbing pain from her anus/rectum area after she has a bowel movement. She wanted to know what she can use for the pain or if we need to prescribe something.   Advised patient's mother that I will talk with Alan when she gets here this morning and give her a call back.  [facility]

## 2025-01-14 ENCOUNTER — Observation Stay

## 2025-01-14 ENCOUNTER — Emergency Department

## 2025-01-14 ENCOUNTER — Other Ambulatory Visit: Payer: Self-pay

## 2025-01-14 ENCOUNTER — Inpatient Hospital Stay
Admission: EM | Admit: 2025-01-14 | Source: Home / Self Care | Attending: Internal Medicine | Admitting: Internal Medicine

## 2025-01-14 DIAGNOSIS — E66813 Obesity, class 3: Secondary | ICD-10-CM | POA: Insufficient documentation

## 2025-01-14 DIAGNOSIS — K3184 Gastroparesis: Secondary | ICD-10-CM | POA: Diagnosis present

## 2025-01-14 DIAGNOSIS — N179 Acute kidney failure, unspecified: Principal | ICD-10-CM | POA: Diagnosis present

## 2025-01-14 DIAGNOSIS — E1143 Type 2 diabetes mellitus with diabetic autonomic (poly)neuropathy: Secondary | ICD-10-CM | POA: Diagnosis not present

## 2025-01-14 DIAGNOSIS — F411 Generalized anxiety disorder: Secondary | ICD-10-CM | POA: Diagnosis present

## 2025-01-14 DIAGNOSIS — R112 Nausea with vomiting, unspecified: Secondary | ICD-10-CM

## 2025-01-14 DIAGNOSIS — K529 Noninfective gastroenteritis and colitis, unspecified: Secondary | ICD-10-CM

## 2025-01-14 DIAGNOSIS — R8290 Unspecified abnormal findings in urine: Secondary | ICD-10-CM

## 2025-01-14 DIAGNOSIS — M7989 Other specified soft tissue disorders: Secondary | ICD-10-CM

## 2025-01-14 DIAGNOSIS — D631 Anemia in chronic kidney disease: Secondary | ICD-10-CM

## 2025-01-14 DIAGNOSIS — R600 Localized edema: Secondary | ICD-10-CM | POA: Diagnosis present

## 2025-01-14 DIAGNOSIS — I160 Hypertensive urgency: Secondary | ICD-10-CM

## 2025-01-14 LAB — URINALYSIS, ROUTINE W REFLEX MICROSCOPIC
Bilirubin Urine: NEGATIVE
Glucose, UA: 50 mg/dL — AB
Hgb urine dipstick: NEGATIVE
Ketones, ur: NEGATIVE mg/dL
Nitrite: NEGATIVE
Protein, ur: >=300 mg/dL — AB
Specific Gravity, Urine: 1.028 (ref 1.005–1.030)
WBC, UA: >50 WBC/hpf (ref 0–5)
pH: 5 (ref 5.0–8.0)

## 2025-01-14 LAB — COMPREHENSIVE METABOLIC PANEL WITH GFR
ALT: 12 U/L (ref 0–44)
AST: 18 U/L (ref 15–41)
Albumin: 2.7 g/dL — ABNORMAL LOW (ref 3.5–5.0)
Alkaline Phosphatase: 99 U/L (ref 38–126)
Anion gap: 10 (ref 5–15)
BUN: 33 mg/dL — ABNORMAL HIGH (ref 6–20)
CO2: 17 mmol/L — ABNORMAL LOW (ref 22–32)
Calcium: 8.4 mg/dL — ABNORMAL LOW (ref 8.9–10.3)
Chloride: 111 mmol/L (ref 98–111)
Creatinine, Ser: 2.78 mg/dL — ABNORMAL HIGH (ref 0.44–1.00)
GFR, Estimated: 22 mL/min — ABNORMAL LOW
Glucose, Bld: 99 mg/dL (ref 70–99)
Potassium: 4.6 mmol/L (ref 3.5–5.1)
Sodium: 139 mmol/L (ref 135–145)
Total Bilirubin: 0.2 mg/dL (ref 0.0–1.2)
Total Protein: 6 g/dL — ABNORMAL LOW (ref 6.5–8.1)

## 2025-01-14 LAB — CBC
HCT: 30.4 % — ABNORMAL LOW (ref 36.0–46.0)
Hemoglobin: 9.4 g/dL — ABNORMAL LOW (ref 12.0–15.0)
MCH: 26.5 pg (ref 26.0–34.0)
MCHC: 30.9 g/dL (ref 30.0–36.0)
MCV: 85.6 fL (ref 80.0–100.0)
Platelets: 343 10*3/uL (ref 150–400)
RBC: 3.55 MIL/uL — ABNORMAL LOW (ref 3.87–5.11)
RDW: 14 % (ref 11.5–15.5)
WBC: 8 10*3/uL (ref 4.0–10.5)
nRBC: 0 % (ref 0.0–0.2)

## 2025-01-14 LAB — PRO BRAIN NATRIURETIC PEPTIDE: Pro Brain Natriuretic Peptide: 22860 pg/mL — ABNORMAL HIGH

## 2025-01-14 LAB — LIPASE, BLOOD: Lipase: 10 U/L — ABNORMAL LOW (ref 11–51)

## 2025-01-14 LAB — GLUCOSE, CAPILLARY: Glucose-Capillary: 101 mg/dL — ABNORMAL HIGH (ref 70–99)

## 2025-01-14 LAB — POC URINE PREG, ED: Preg Test, Ur: NEGATIVE

## 2025-01-14 MED ORDER — NIFEDIPINE ER 60 MG PO TB24
60.0000 mg | ORAL_TABLET | Freq: Every day | ORAL | Status: AC
  Administered 2025-01-15 – 2025-01-23 (×9): 60 mg via ORAL
  Filled 2025-01-14 (×9): qty 1

## 2025-01-14 MED ORDER — METOCLOPRAMIDE HCL 5 MG/ML IJ SOLN
10.0000 mg | Freq: Four times a day (QID) | INTRAMUSCULAR | Status: DC
  Administered 2025-01-15 (×3): 10 mg via INTRAVENOUS
  Filled 2025-01-14 (×3): qty 2

## 2025-01-14 MED ORDER — SODIUM CHLORIDE 0.9 % IV SOLN: 12.5000 mg | Freq: Four times a day (QID) | INTRAVENOUS | Status: AC | PRN

## 2025-01-14 MED ORDER — LACTATED RINGERS IV SOLN: INTRAVENOUS | Status: AC

## 2025-01-14 MED ORDER — PANTOPRAZOLE SODIUM 40 MG IV SOLR
40.0000 mg | INTRAVENOUS | Status: DC
  Administered 2025-01-14 – 2025-01-16 (×3): 40 mg via INTRAVENOUS
  Filled 2025-01-14 (×3): qty 10

## 2025-01-14 MED ORDER — ACETAMINOPHEN 650 MG RE SUPP: 650.0000 mg | Freq: Four times a day (QID) | RECTAL | Status: AC | PRN

## 2025-01-14 MED ORDER — EMPAGLIFLOZIN 10 MG PO TABS
10.0000 mg | ORAL_TABLET | Freq: Every day | ORAL | Status: DC
  Administered 2025-01-15 – 2025-01-20 (×6): 10 mg via ORAL
  Filled 2025-01-14 (×6): qty 1

## 2025-01-14 MED ORDER — BUSPIRONE HCL 10 MG PO TABS
5.0000 mg | ORAL_TABLET | Freq: Two times a day (BID) | ORAL | Status: AC
  Administered 2025-01-14 – 2025-01-23 (×19): 5 mg via ORAL
  Filled 2025-01-14 (×19): qty 1

## 2025-01-14 MED ORDER — HYDRALAZINE HCL 20 MG/ML IJ SOLN
10.0000 mg | Freq: Four times a day (QID) | INTRAMUSCULAR | Status: AC | PRN
  Administered 2025-01-14: 10 mg via INTRAVENOUS
  Filled 2025-01-14: qty 1

## 2025-01-14 MED ORDER — LISINOPRIL 10 MG PO TABS
10.0000 mg | ORAL_TABLET | Freq: Every day | ORAL | Status: DC
  Administered 2025-01-15 – 2025-01-16 (×2): 10 mg via ORAL
  Filled 2025-01-14 (×2): qty 1

## 2025-01-14 MED ORDER — MORPHINE SULFATE (PF) 2 MG/ML IV SOLN
2.0000 mg | INTRAVENOUS | Status: DC | PRN
  Administered 2025-01-15 – 2025-01-16 (×3): 2 mg via INTRAVENOUS
  Filled 2025-01-14 (×3): qty 1

## 2025-01-14 MED ORDER — ATORVASTATIN CALCIUM 20 MG PO TABS
40.0000 mg | ORAL_TABLET | Freq: Every day | ORAL | Status: AC
  Administered 2025-01-15 – 2025-01-23 (×9): 40 mg via ORAL
  Filled 2025-01-14 (×9): qty 2

## 2025-01-14 MED ORDER — HYDROCODONE-ACETAMINOPHEN 5-325 MG PO TABS
1.0000 | ORAL_TABLET | ORAL | Status: AC | PRN
  Administered 2025-01-17 – 2025-01-23 (×10): 2 via ORAL
  Filled 2025-01-14 (×10): qty 2

## 2025-01-14 MED ORDER — ONDANSETRON HCL 4 MG/2ML IJ SOLN: 4.0000 mg | Freq: Four times a day (QID) | INTRAMUSCULAR | Status: AC | PRN

## 2025-01-14 MED ORDER — INSULIN ASPART 100 UNIT/ML IJ SOLN
0.0000 [IU] | INTRAMUSCULAR | Status: DC
  Administered 2025-01-15: 1 [IU] via SUBCUTANEOUS
  Filled 2025-01-14: qty 1
  Filled 2025-01-14: qty 3

## 2025-01-14 MED ORDER — ACETAMINOPHEN 325 MG PO TABS: 650.0000 mg | ORAL_TABLET | Freq: Four times a day (QID) | ORAL | Status: AC | PRN

## 2025-01-14 MED ORDER — ENOXAPARIN SODIUM 60 MG/0.6ML IJ SOSY
60.0000 mg | PREFILLED_SYRINGE | INTRAMUSCULAR | Status: DC
  Administered 2025-01-14 – 2025-01-17 (×4): 60 mg via SUBCUTANEOUS
  Filled 2025-01-14 (×4): qty 0.6

## 2025-01-14 MED ORDER — METOCLOPRAMIDE HCL 5 MG/ML IJ SOLN
10.0000 mg | Freq: Once | INTRAMUSCULAR | Status: AC
  Administered 2025-01-14: 10 mg via INTRAVENOUS
  Filled 2025-01-14: qty 2

## 2025-01-14 MED ORDER — HYDROXYZINE HCL 25 MG PO TABS
25.0000 mg | ORAL_TABLET | Freq: Three times a day (TID) | ORAL | Status: AC | PRN
  Administered 2025-01-15 – 2025-01-20 (×3): 25 mg via ORAL
  Filled 2025-01-14 (×5): qty 1

## 2025-01-14 MED ORDER — LACTATED RINGERS IV BOLUS
500.0000 mL | Freq: Once | INTRAVENOUS | Status: AC
  Administered 2025-01-14: 500 mL via INTRAVENOUS

## 2025-01-14 MED ORDER — ONDANSETRON HCL 4 MG PO TABS: 4.0000 mg | ORAL_TABLET | Freq: Four times a day (QID) | ORAL | Status: AC | PRN

## 2025-01-14 NOTE — Assessment & Plan Note (Addendum)
 Appears chronic-no shortness of breath BNP elevated but this could be related to abnormal renal function Lower extremity venous ultrasound negative for DVT

## 2025-01-14 NOTE — ED Triage Notes (Signed)
 Pt to ED for abd pain, nausea started Sunday night. Reports has chronic stomach issues. Also reports bilateral leg swelling.

## 2025-01-14 NOTE — Assessment & Plan Note (Signed)
 Continue duloxetine  and buspirone  with hydroxyzine  as needed

## 2025-01-14 NOTE — ED Provider Notes (Signed)
 Katie Gilbert Provider Note    Event Date/Time   First MD Initiated Contact with Patient 01/14/25 1748     (approximate)   History   Abdominal Pain   HPI  Katie Gilbert is a 34 y.o. female history of GAD, hypertension, hyperlipidemia, diabetes, chronic lower extremity edema, presenting with abdominal pain and nausea on Sunday.  Had reported to triage that she has chronic stomach issues.  Denies marijuana use.  States that she has not had a repeat gastric emptying study yet.  Also notes worsening lower extremity swelling.  Her left side is slightly larger than the right.  No chest pain or shortness of breath.  No fever.  No diarrhea or urinary symptoms.  States that she was told by her nephrologist that the swelling is due to her kidney issues and to continue her diuretic.  States that she has been taking her diuretic.  On independent chart review, she was seen by GI in September 2025, has history of diabetic gastroparesis with history of nausea vomiting abdominal pain.  He was started on Reglan , omeprazole.  Zofran  as needed.  Had a gastric emptying study in 2022 that was normal.     Physical Exam   Triage Vital Signs: ED Triage Vitals  Encounter Vitals Group     BP 01/14/25 1616 (!) 199/128     Girls Systolic BP Percentile --      Girls Diastolic BP Percentile --      Boys Systolic BP Percentile --      Boys Diastolic BP Percentile --      Pulse Rate 01/14/25 1616 94     Resp 01/14/25 1616 18     Temp 01/14/25 1616 97.8 F (36.6 C)     Temp src --      SpO2 01/14/25 1616 99 %     Weight 01/14/25 1617 280 lb (127 kg)     Height 01/14/25 1617 6' 3 (1.905 m)     Head Circumference --      Peak Flow --      Pain Score 01/14/25 1617 10     Pain Loc --      Pain Education --      Exclude from Growth Chart --     Most recent vital signs: Vitals:   01/14/25 2103 01/14/25 2117  BP: (!) 189/115 (!) 185/101  Pulse:  80  Resp:  17  Temp:  98.2 F  (36.8 C)  SpO2:  100%     General: Awake, no distress.  CV:  Good peripheral perfusion.  Resp:  Normal effort.  Abd:  No distention.  Soft, nontender, no guarding Other:  Bilateral lower extremity edema, left lower extremity slightly larger than the right, mildly dry oral mucosa  ED Results / Procedures / Treatments   Labs (all labs ordered are listed, but only abnormal results are displayed) Labs Reviewed  LIPASE, BLOOD - Abnormal; Notable for the following components:      Result Value   Lipase 10 (*)    All other components within normal limits  COMPREHENSIVE METABOLIC PANEL WITH GFR - Abnormal; Notable for the following components:   CO2 17 (*)    BUN 33 (*)    Creatinine, Ser 2.78 (*)    Calcium  8.4 (*)    Total Protein 6.0 (*)    Albumin 2.7 (*)    GFR, Estimated 22 (*)    All other components within normal limits  CBC -  Abnormal; Notable for the following components:   RBC 3.55 (*)    Hemoglobin 9.4 (*)    HCT 30.4 (*)    All other components within normal limits  URINALYSIS, ROUTINE W REFLEX MICROSCOPIC - Abnormal; Notable for the following components:   Color, Urine AMBER (*)    APPearance TURBID (*)    Glucose, UA 50 (*)    Protein, ur >=300 (*)    Leukocytes,Ua SMALL (*)    Bacteria, UA MANY (*)    All other components within normal limits  PRO BRAIN NATRIURETIC PEPTIDE - Abnormal; Notable for the following components:   Pro Brain Natriuretic Peptide 22,860.0 (*)    All other components within normal limits  GLUCOSE, CAPILLARY - Abnormal; Notable for the following components:   Glucose-Capillary 101 (*)    All other components within normal limits  HEMOGLOBIN A1C  HIV ANTIBODY (ROUTINE TESTING W REFLEX)  CBC  COMPREHENSIVE METABOLIC PANEL WITH GFR  URINALYSIS, W/ REFLEX TO CULTURE (INFECTION SUSPECTED)  POC URINE PREG, ED     EKG  EKG shows, sinus rhythm, rate 92, normal QS, normal QTc, no obvious ischemic ST elevation, T wave flattening to aVL,  not significantly changed compared to prior   RADIOLOGY On my independent interpretation, ultrasound without obvious DVT   PROCEDURES:  Critical Care performed: No  Procedures   MEDICATIONS ORDERED IN ED: Medications  enoxaparin  (LOVENOX ) injection 60 mg (has no administration in time range)  metoCLOPramide  (REGLAN ) injection 10 mg (has no administration in time range)  promethazine  (PHENERGAN ) 12.5 mg in sodium chloride  0.9 % 50 mL IVPB (has no administration in time range)  insulin  aspart (novoLOG ) injection 0-9 Units (has no administration in time range)  lactated ringers  infusion (has no administration in time range)  acetaminophen  (TYLENOL ) tablet 650 mg (has no administration in time range)    Or  acetaminophen  (TYLENOL ) suppository 650 mg (has no administration in time range)  HYDROcodone -acetaminophen  (NORCO/VICODIN) 5-325 MG per tablet 1-2 tablet (has no administration in time range)  morphine  (PF) 2 MG/ML injection 2 mg (has no administration in time range)  ondansetron  (ZOFRAN ) tablet 4 mg (has no administration in time range)    Or  ondansetron  (ZOFRAN ) injection 4 mg (has no administration in time range)  hydrALAZINE  (APRESOLINE ) injection 10 mg (10 mg Intravenous Given 01/14/25 2103)  pantoprazole  (PROTONIX ) injection 40 mg (has no administration in time range)  atorvastatin  (LIPITOR) tablet 40 mg (has no administration in time range)  busPIRone  (BUSPAR ) tablet 5 mg (has no administration in time range)  empagliflozin  (JARDIANCE ) tablet 10 mg (has no administration in time range)  hydrOXYzine  (ATARAX ) tablet 25 mg (has no administration in time range)  lisinopril  (ZESTRIL ) tablet 10 mg (has no administration in time range)  NIFEdipine  (ADALAT  CC) 24 hr tablet 60 mg (has no administration in time range)  metoCLOPramide  (REGLAN ) injection 10 mg (10 mg Intravenous Given 01/14/25 1919)  lactated ringers  bolus 500 mL (0 mLs Intravenous Stopped 01/14/25 2030)      IMPRESSION / MDM / ASSESSMENT AND PLAN / ED COURSE  I reviewed the triage vital signs and the nursing notes.                              Differential diagnosis includes, but is not limited to, gastroparesis, cyclic vomiting syndrome, IBS, no abdominal pain on exam to suggest colitis or diverticulitis or appendicitis or biliary etiology.  Also consider viral illness,  gastroenteritis.  For her leg swelling, did consider DVT, nephrotic syndrome, CHF.  She denies any chest pain or shortness of breath.  Will get labs, DVT ultrasound.  Reglan .  Patient's presentation is most consistent with acute presentation with potential threat to life or bodily function.  Independent interpretation of labs and imaging below.  Clinical course as below.  Given her AKI, nausea vomiting, dehydration, she will need to come in for further management.  Consult to hospitalist, she is admitted.    Clinical Course as of 01/14/25 2154  Wed Jan 14, 2025  8097 Independent review of labs, no leukocytosis, lipase is not elevated, LFTs are not elevated, electrolytes really deranged, she does have an AKI, last creatinine was in September and was 1.9.  She is mildly acidotic without anion gap, could be related to renal disease or dehydration from the nausea vomiting.  Will give her a small fluid bolus.  Will plan to have her admitted for further management. [TT]    Clinical Course User Index [TT] Waymond, Lorelle Cummins, MD     FINAL CLINICAL IMPRESSION(S) / ED DIAGNOSES   Final diagnoses:  Nausea and vomiting, unspecified vomiting type  AKI (acute kidney injury)  Leg swelling     Rx / DC Orders   ED Discharge Orders     None        Note:  This document was prepared using Dragon voice recognition software and may include unintentional dictation errors.    Waymond Lorelle Cummins, MD 01/14/25 2154

## 2025-01-14 NOTE — Assessment & Plan Note (Addendum)
 History of pyelonephritis 2022 Urine with small leuks and many bacteria Will get urine culture Holding off on antibiotics for now pending urine culture

## 2025-01-14 NOTE — H&P (Signed)
 " History and Physical    Patient: Katie Gilbert FMW:[id number] DOB: December 17, 1991 DOA: 01/14/2025 DOS: the patient was seen and examined on 01/14/2025 PCP: Orlean Alan HERO, FNP  Patient coming from: Home  Chief Complaint:  Chief Complaint  Patient presents with   Abdominal Pain    HPI: Katie Gilbert is a 34 y.o. 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  She presented with a several day history of nausea and vomiting, poor oral intake associated with generalized weakness. At the present time she denies abdominal pain, fever or change in bowel habits or dysuria.  She does endorse lower extremity edema L>R that is chronic.  Denies shortness of breath. Of note, she has had prior hospitalizations for intractable symptoms, most recently November 2022.  EGD at that time showed a large volume of food residue.She is followed by GI at [facility], last seen September 2025 with plans for nuclear gastric emptying studies, not yet done.  . On arrival, BP 199/128 with otherwise normal vitals Labs notable for the following: - Normal WBC of 8 -Hemoglobin at baseline at 9.4 - Creatinine 2.78 up from baseline of 1.13 with bicarb 17 and normal anion gap - Lipase 10 with normal LFTs - Hypoalbuminemia - proBNP 22 860, troponin not done - Urinalysis with proteinuria, small leuks and many bacteria  EKG showed NSR at 92  Lower extremity venous ultrasound negative for DVT   Addendum: Following admission patient started having watery diarrhea.  Denies recent antibiotics.  GI panel sent   Past Medical History:  Diagnosis Date   C. difficile enteritis 07/24/2021   Diarrhea 07/22/2021   Genital herpes    Nausea & vomiting 07/22/2021   Type II diabetes mellitus with renal manifestations [facility])    Past Surgical History:   Procedure Laterality Date   FOOT SURGERY Right    I & D EXTREMITY Right 01/17/2019   Procedure: IRRIGATION AND DEBRIDEMENT RIGHT FOOT;  Surgeon: Ashley Soulier, DPM;  Location: [facility] ORS;  Service: Podiatry;  Laterality: Right;   Social History:  reports that she has been smoking cigarettes. She has never used smokeless tobacco. She reports that she does not drink alcohol and does not use drugs.  Allergies[1]  Family History  Problem Relation Age of Onset   Hypertension Father    Heart attack Father    Diabetes Father    Diabetes Maternal Grandmother     Prior to Admission medications  Medication Sig Start Date End Date Taking? Authorizing Provider  atorvastatin  (LIPITOR) 40 MG tablet Take 40 mg by mouth daily. 08/19/24 09/23/25 Yes [provider]  ondansetron  (ZOFRAN -ODT) 4 MG disintegrating tablet Take 4 mg by mouth every 8 (eight) hours as needed for nausea. 09/03/24  Yes [provider]  blood glucose meter kit and supplies KIT Dispense based on patient and insurance preference. Use up to four times daily as directed. (FOR ICD-9 250.00, 250.01). Patient not taking: Reported on 07/24/2024 05/10/21   Trudy Anthony HERO, MD  busPIRone  (BUSPAR ) 5 MG tablet Take 1 tablet (5 mg total) by mouth 3 (three) times daily. Patient taking differently: Take 5 mg by mouth as needed. 12/28/23   Orlean Alan HERO, FNP  chlorthalidone (HYGROTON) 25 MG tablet Take 1 tablet by mouth daily. 03/31/24   [provider]  DULoxetine  (CYMBALTA ) 20 MG capsule Take 1 capsule (20 mg total)  by mouth daily. Patient not taking: Reported on 07/24/2024 03/22/23   Orlean Alan HERO, FNP  empagliflozin  (JARDIANCE ) 10 MG TABS tablet Take 10 mg by mouth daily. 05/26/24   [provider]  ferrous sulfate  325 (65 FE) MG EC tablet Take 325 mg by mouth every other day. Patient not taking: Reported on 07/24/2024 03/30/24 03/30/25  [provider]  furosemide  (LASIX ) 20 MG tablet Take one tablet  every morning for 4 days, then take as needed Patient not taking: Reported on 07/24/2024 01/18/24   Scoggins, [facility], NP  hydrochlorothiazide  (HYDRODIURIL ) 12.5 MG tablet Take 1 tablet (12.5 mg total) by mouth daily. Patient not taking: Reported on 07/24/2024 03/22/23   Orlean Alan HERO, FNP  hydrOXYzine  (VISTARIL ) 25 MG capsule Take 1 capsule (25 mg total) by mouth 3 (three) times daily as needed for anxiety (insomnia). 04/08/24   Orlean Alan HERO, FNP  lisinopril  (ZESTRIL ) 10 MG tablet Take 10 mg by mouth daily. 05/26/24   [provider]  metFORMIN  (GLUCOPHAGE ) 500 MG tablet Take 500 mg by mouth 2 (two) times daily with a meal. 03/30/24   [provider]  NIFEdipine  (PROCARDIA  XL/NIFEDICAL XL) 60 MG 24 hr tablet Take 60 mg by mouth daily.    [provider]  omeprazole (PRILOSEC) 40 MG capsule Take 40 mg by mouth daily.    [provider]  Vitamin D , Ergocalciferol , (DRISDOL ) 1.25 MG (50000 UNIT) CAPS capsule Take 1 capsule (50,000 Units total) by mouth every 7 (seven) days. 03/14/24   Orlean Alan HERO, FNP    Physical Exam: Vitals:   01/14/25 1616 01/14/25 1617 01/14/25 2102 01/14/25 2103  BP: (!) 199/128  (!) 189/115 (!) 189/115  Pulse: 94  79   Resp: 18  18   Temp: 97.8 F (36.6 C)     SpO2: 99%  100%   Weight:  127 kg    Height:  6' 3 (1.905 m)     Physical Exam Vitals and nursing note reviewed.  Constitutional:      General: She is not in acute distress. HENT:     Head: Normocephalic and atraumatic.  Cardiovascular:     Rate and Rhythm: Normal rate and regular rhythm.     Heart sounds: Normal heart sounds.  Pulmonary:     Effort: Pulmonary effort is normal.     Breath sounds: Normal breath sounds.  Abdominal:     Palpations: Abdomen is soft.     Tenderness: There is no abdominal tenderness.  Neurological:     Mental Status: Mental status is at baseline.     Labs on Admission: I have personally reviewed following labs and imaging  studies  CBC: Recent Labs  Lab 01/14/25 1724  WBC 8.0  HGB 9.4*  HCT 30.4*  MCV 85.6  PLT 343   Basic Metabolic Panel: Recent Labs  Lab 01/14/25 1724  NA 139  K 4.6  CL 111  CO2 17*  GLUCOSE 99  BUN 33*  CREATININE 2.78*  CALCIUM  8.4*   GFR: Estimated Creatinine Clearance: 44.9 mL/min (A) (by C-G formula based on SCr of 2.78 mg/dL (H)). Liver Function Tests: Recent Labs  Lab 01/14/25 1724  AST 18  ALT 12  ALKPHOS 99  BILITOT 0.2  PROT 6.0*  ALBUMIN 2.7*   Recent Labs  Lab 01/14/25 1724  LIPASE 10*   No results for input(s): AMMONIA in the last 168 hours. Coagulation Profile: No results for input(s): INR, PROTIME in the last 168 hours. Cardiac Enzymes:  No results for input(s): CKTOTAL, CKMB, CKMBINDEX, TROPONINI in the last 168 hours. BNP (last 3 results) Recent Labs    01/14/25 1724  PROBNP 22,860.0*   HbA1C: No results for input(s): HGBA1C in the last 72 hours. CBG: No results for input(s): GLUCAP in the last 168 hours. Lipid Profile: No results for input(s): CHOL, HDL, LDLCALC, TRIG, CHOLHDL, LDLDIRECT in the last 72 hours. Thyroid Function Tests: No results for input(s): TSH, T4TOTAL, FREET4, T3FREE, THYROIDAB in the last 72 hours. Anemia Panel: No results for input(s): VITAMINB12, FOLATE, FERRITIN, TIBC, IRON, RETICCTPCT in the last 72 hours. Urine analysis:    Component Value Date/Time   COLORURINE AMBER (A) 01/14/2025 1941   APPEARANCEUR TURBID (A) 01/14/2025 1941   APPEARANCEUR Cloudy 07/15/2014 1806   LABSPEC 1.028 01/14/2025 1941   LABSPEC 1.039 07/15/2014 1806   PHURINE 5.0 01/14/2025 1941   GLUCOSEU 50 (A) 01/14/2025 1941   GLUCOSEU >=500 07/15/2014 1806   HGBUR NEGATIVE 01/14/2025 1941   BILIRUBINUR NEGATIVE 01/14/2025 1941   BILIRUBINUR Negative 03/08/2023 1430   BILIRUBINUR Negative 07/15/2014 1806   KETONESUR NEGATIVE 01/14/2025 1941   PROTEINUR >=300 (A) 01/14/2025 1941    UROBILINOGEN 0.2 03/08/2023 1430   NITRITE NEGATIVE 01/14/2025 1941   LEUKOCYTESUR SMALL (A) 01/14/2025 1941   LEUKOCYTESUR 3+ 07/15/2014 1806    Radiological Exams on Admission: US  Venous Img Lower Bilateral (DVT) Result Date: 01/14/2025 CLINICAL DATA:  Bilateral lower extremity swelling. EXAM: Bilateral LOWER EXTREMITY VENOUS DOPPLER ULTRASOUND TECHNIQUE: Gray-scale sonography with compression, as well as color and duplex ultrasound, were performed to evaluate the deep venous system(s) from the level of the common femoral vein through the popliteal and proximal calf veins. COMPARISON:  Ultrasound dated 09/24/2017. FINDINGS: VENOUS Normal compressibility of the common femoral, superficial femoral, and popliteal veins, as well as the visualized calf veins. Visualized portions of profunda femoral vein and great saphenous vein unremarkable. No filling defects to suggest DVT on grayscale or color Doppler imaging. Doppler waveforms show normal direction of venous flow, normal respiratory plasticity and response to augmentation. OTHER Bilateral calf subcutaneous edema. Limitations: none IMPRESSION: 1. No evidence of DVT. 2. Bilateral calf subcutaneous edema. Electronically Signed   By: Vanetta Chou M.D.   On: 01/14/2025 20:36   Data Reviewed for HPI: Relevant notes from primary care and specialist visits, past discharge summaries as available in EHR, including Care Everywhere. Prior diagnostic testing as pertinent to current admission diagnoses Updated medications and problem lists for reconciliation ED course, including vitals, labs, imaging, treatment and response to treatment Triage notes, nursing and pharmacy notes and ED provider's notes Notable results as noted above in HPI      Assessment and Plan: * Diabetic gastroparesis associated with type 2 diabetes mellitus (HCC) Patient with intractable nausea and vomiting with evidence of dehydration History of prior hospitalization for  gastroparesis last in 2022 Followed by [facility] GI, last seen September 2025 Will treat with scheduled Reglan , as needed Zofran  with as needed Phenergan  for refractory vomiting IV Protonix  Sliding scale insulin  coverage Clear liquid diet advance to small portion meals as tolerated Can consider GI consult for gastric emptying study-previously scheduled September 2025 however not yet done  Acute gastroenteritis Following admission, patient developed watery diarrhea, several episodes States her stool is soft at baseline but this is unusual Follow GI panel  Loperamide   AKI (acute kidney injury) Acute metabolic acidosis, none anion gap CKD ll - llla / diabetic nephropathy Creatinine 2.78 up from baseline of 1.13 with bicarb 17 and  normal anion gap Urinalysis with proteinuria Likely related to vomiting related to diabetic gastroparesis IV hydration DC metformin  Monitor renal function and avoid nephrotoxins Can consider nephrology consult if worsening   Abnormal urinalysis History of pyelonephritis 2022 Urine with small leuks and many bacteria Will get urine culture Holding off on antibiotics for now pending urine culture  Anemia of chronic kidney failure, stage 2 (mild) Hemoglobin stable around 9.1  Bilateral lower extremity edema Appears chronic-no shortness of breath BNP elevated but this could be related to abnormal renal function Lower extremity venous ultrasound negative for DVT  Generalized anxiety disorder Continue duloxetine  and buspirone  with hydroxyzine  as needed  Obesity, Class III, BMI 40-49.9 (morbid obesity) (HCC) Increased risk of poor health outcomes     DVT prophylaxis: Lovenox   Consults: none  Advance Care Planning:   Code Status: Full Code   Family Communication: none  Disposition Plan: Back to previous home environment  Severity of Illness: The appropriate patient status for this patient is OBSERVATION. Observation status is judged to be reasonable  and necessary in order to provide the required intensity of service to ensure the patient's safety. The patient's presenting symptoms, physical exam findings, and initial radiographic and laboratory data in the context of their medical condition is felt to place them at decreased risk for further clinical deterioration. Furthermore, it is anticipated that the patient will be medically stable for discharge from the hospital within 2 midnights of admission.   Author: Delayne LULLA Solian, MD 01/14/2025 9:13 PM  For on call review [email or web address].      [1]  Allergies Allergen Reactions   Haloperidol Other (See Comments)    Dystonic reaction  Neck spasms   "

## 2025-01-14 NOTE — Assessment & Plan Note (Signed)
 Hemoglobin stable around 9.1

## 2025-01-14 NOTE — Assessment & Plan Note (Addendum)
 Acute metabolic acidosis, none anion gap CKD ll - llla / diabetic nephropathy Creatinine 2.78 up from baseline of 1.13 with bicarb 17 and normal anion gap Urinalysis with proteinuria Likely related to vomiting related to diabetic gastroparesis IV hydration DC metformin  Monitor renal function and avoid nephrotoxins Can consider nephrology consult if worsening

## 2025-01-14 NOTE — Assessment & Plan Note (Addendum)
 Patient with intractable nausea and vomiting with evidence of dehydration History of prior hospitalization for gastroparesis last in 2022 Followed by [facility] GI, last seen September 2025 Will treat with scheduled Reglan , as needed Zofran  with as needed Phenergan  for refractory vomiting IV Protonix  Sliding scale insulin  coverage Clear liquid diet advance to small portion meals as tolerated Can consider GI consult for gastric emptying study-previously scheduled September 2025 however not yet done

## 2025-01-15 DIAGNOSIS — K3184 Gastroparesis: Secondary | ICD-10-CM | POA: Diagnosis not present

## 2025-01-15 DIAGNOSIS — E66813 Obesity, class 3: Secondary | ICD-10-CM | POA: Insufficient documentation

## 2025-01-15 DIAGNOSIS — K529 Noninfective gastroenteritis and colitis, unspecified: Secondary | ICD-10-CM

## 2025-01-15 DIAGNOSIS — E1143 Type 2 diabetes mellitus with diabetic autonomic (poly)neuropathy: Secondary | ICD-10-CM | POA: Diagnosis not present

## 2025-01-15 LAB — CBC
HCT: 31.6 % — ABNORMAL LOW (ref 36.0–46.0)
Hemoglobin: 9.7 g/dL — ABNORMAL LOW (ref 12.0–15.0)
MCH: 26.5 pg (ref 26.0–34.0)
MCHC: 30.7 g/dL (ref 30.0–36.0)
MCV: 86.3 fL (ref 80.0–100.0)
Platelets: 337 10*3/uL (ref 150–400)
RBC: 3.66 MIL/uL — ABNORMAL LOW (ref 3.87–5.11)
RDW: 14.1 % (ref 11.5–15.5)
WBC: 8.3 10*3/uL (ref 4.0–10.5)
nRBC: 0 % (ref 0.0–0.2)

## 2025-01-15 LAB — GASTROINTESTINAL PANEL BY PCR, STOOL (REPLACES STOOL CULTURE)

## 2025-01-15 LAB — COMPREHENSIVE METABOLIC PANEL WITH GFR
ALT: 15 U/L (ref 0–44)
AST: 27 U/L (ref 15–41)
Albumin: 2.5 g/dL — ABNORMAL LOW (ref 3.5–5.0)
Alkaline Phosphatase: 109 U/L (ref 38–126)
Anion gap: 10 (ref 5–15)
BUN: 33 mg/dL — ABNORMAL HIGH (ref 6–20)
CO2: 16 mmol/L — ABNORMAL LOW (ref 22–32)
Calcium: 8.1 mg/dL — ABNORMAL LOW (ref 8.9–10.3)
Chloride: 109 mmol/L (ref 98–111)
Creatinine, Ser: 2.87 mg/dL — ABNORMAL HIGH (ref 0.44–1.00)
GFR, Estimated: 21 mL/min — ABNORMAL LOW
Glucose, Bld: 143 mg/dL — ABNORMAL HIGH (ref 70–99)
Potassium: 4.3 mmol/L (ref 3.5–5.1)
Sodium: 134 mmol/L — ABNORMAL LOW (ref 135–145)
Total Bilirubin: <0.2 mg/dL (ref 0.0–1.2)
Total Protein: 5.9 g/dL — ABNORMAL LOW (ref 6.5–8.1)

## 2025-01-15 LAB — GLUCOSE, CAPILLARY
Glucose-Capillary: 122 mg/dL — ABNORMAL HIGH (ref 70–99)
Glucose-Capillary: 86 mg/dL (ref 70–99)
Glucose-Capillary: 109 mg/dL — ABNORMAL HIGH (ref 70–99)
Glucose-Capillary: 198 mg/dL — ABNORMAL HIGH (ref 70–99)

## 2025-01-15 LAB — HEMOGLOBIN A1C
Hgb A1c MFr Bld: 5.9 % — ABNORMAL HIGH (ref 4.8–5.6)
Mean Plasma Glucose: 122.63 mg/dL

## 2025-01-15 LAB — C DIFFICILE QUICK SCREEN W PCR REFLEX
C Diff antigen: NEGATIVE
C Diff interpretation: NOT DETECTED
C Diff toxin: NEGATIVE

## 2025-01-15 MED ORDER — LOPERAMIDE HCL 2 MG PO CAPS
2.0000 mg | ORAL_CAPSULE | Freq: Once | ORAL | Status: AC
  Administered 2025-01-15: 2 mg via ORAL
  Filled 2025-01-15: qty 1

## 2025-01-15 MED ORDER — LOPERAMIDE HCL 2 MG PO CAPS
4.0000 mg | ORAL_CAPSULE | Freq: Once | ORAL | Status: AC
  Administered 2025-01-15: 4 mg via ORAL
  Filled 2025-01-15: qty 2

## 2025-01-15 NOTE — Progress Notes (Signed)
 Mobility Specialist - Progress Note   01/15/25 1242  Mobility  Activity Stood with assistance;Pivoted/transferred to/from [facility]  Level of Assistance Moderate assist, patient does 50-74%  Assistive Device None  Distance Ambulated (ft) 2 ft  Activity Response Tolerated well  Mobility visit 1 Mobility  Mobility Specialist Start Time (ACUTE ONLY) 1229  Mobility Specialist Stop Time (ACUTE ONLY) 1235  Mobility Specialist Time Calculation (min) (ACUTE ONLY) 6 min   Ms assisting NT. Pt required ModA +2 to stand, requiring MaxA for peri care. Pt transfer to bed via SPT ModA +2-- expressed fear of falling during transfer. Pt left seated EOB with needs within reach.  America Silvan Mobility Specialist 01/15/25 12:47 PM

## 2025-01-15 NOTE — Progress Notes (Signed)
" °  PROGRESS NOTE    Katie Gilbert  FMW:[id number] DOB: [birth year] DOA: 01/14/2025 PCP: Orlean Alan HERO, FNP  153A/153A-AA  LOS: 0 days   Brief hospital course:   Assessment & Plan: Katie Gilbert is a 34 y.o. 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  She presented with a several day history of nausea and vomiting, poor oral intake associated with generalized weakness. At the present time she denies abdominal pain, fever or change in bowel habits or dysuria.  She does endorse lower extremity edema L>R that is chronic.  Denies shortness of breath. Of note, she has had prior hospitalizations for intractable symptoms, most recently November 2022.  EGD at that time showed a large volume of food residue.She is followed by GI at [facility], last seen September 2025 with plans for nuclear gastric emptying studies, not yet done.    N/V likely 2/2 Gastroparesis --improved. --advance to low-fat diet --d/c IV Reglan   Diarrhea Acute gastroenteritis --started after presentation.  C diff and GI path neg. --avoid Imodium  --d/c IV Reglan   AKI (acute kidney injury) Acute metabolic acidosis, none anion gap CKD ll - llla / diabetic nephropathy Creatinine 2.78 up from baseline of 1.13 with bicarb 17 and normal anion gap Urinalysis with proteinuria --likely dehydration --cont MIVF  Anemia of chronic kidney failure, stage 2 (mild) Hemoglobin stable around 9.1  Bilateral lower extremity edema Appears chronic-no shortness of breath BNP elevated but this could be related to abnormal renal function Lower extremity venous ultrasound negative for DVT  Generalized anxiety disorder --cont Buspar   HTN --cont Nifedipine  and Lisinopril   Obesity, Class III, BMI 40-49.9 (morbid obesity) (HCC) Increased risk of poor  health outcomes   DVT prophylaxis: Lovenox  SQ Code Status: Full code  Family Communication:  Level of care: Med-Surg Dispo:   The patient is from: home Anticipated d/c is to: home Anticipated d/c date is: 2-3 days   Subjective and Interval History:  Pt started having diarrhea after presentation.  No dyspnea.  No N/V.  Abdominal pain improved.   Objective: Vitals:   01/14/25 2303 01/15/25 0422 01/15/25 0812 01/15/25 1514  BP:  (!) 153/101 (!) 161/100 128/82  Pulse:  90 87 85  Resp:  18 19 19   Temp:  97.6 F (36.4 C) 98.5 F (36.9 C) (!) 97.5 F (36.4 C)  TempSrc: Oral Oral  Oral  SpO2:  100% 99% 97%  Weight:      Height:        Intake/Output Summary (Last 24 hours) at 01/15/2025 2019 Last data filed at 01/15/2025 1854 Gross per 24 hour  Intake 1875.62 ml  Output --  Net 1875.62 ml   Filed Weights   01/14/25 1617 01/14/25 2151  Weight: 127 kg (!) 149 kg    Examination:   Constitutional: NAD, AAOx3 HEENT: conjunctivae and lids normal, EOMI CV: No cyanosis.   RESP: normal respiratory effort, on RA Extremities: edema in BLE SKIN: warm, dry Neuro: II - XII grossly intact.   Psych: Normal mood and affect.  Appropriate judgement and reason   Data Reviewed: I have personally reviewed labs and imaging studies  Time spent: 50 minutes  Ellouise Haber, MD [facility] If 7PM-7AM, please contact night-coverage 01/15/2025, 8:19 PM   "

## 2025-01-15 NOTE — Assessment & Plan Note (Signed)
 Following admission, patient developed watery diarrhea, several episodes States her stool is soft at baseline but this is unusual Follow GI panel  Loperamide 

## 2025-01-15 NOTE — Assessment & Plan Note (Signed)
 Increased risk of poor health outcomes

## 2025-01-15 NOTE — Plan of Care (Signed)
   Problem: Coping: Goal: Ability to adjust to condition or change in health will improve Outcome: Progressing

## 2025-01-16 DIAGNOSIS — K3184 Gastroparesis: Secondary | ICD-10-CM | POA: Diagnosis not present

## 2025-01-16 DIAGNOSIS — E1143 Type 2 diabetes mellitus with diabetic autonomic (poly)neuropathy: Secondary | ICD-10-CM | POA: Diagnosis not present

## 2025-01-16 LAB — MISC LABCORP TEST (SEND OUT): Labcorp test code: 83035

## 2025-01-16 LAB — BASIC METABOLIC PANEL WITH GFR
Anion gap: 5 (ref 5–15)
BUN: 31 mg/dL — ABNORMAL HIGH (ref 6–20)
CO2: 18 mmol/L — ABNORMAL LOW (ref 22–32)
Calcium: 7.6 mg/dL — ABNORMAL LOW (ref 8.9–10.3)
Chloride: 113 mmol/L — ABNORMAL HIGH (ref 98–111)
Creatinine, Ser: 2.92 mg/dL — ABNORMAL HIGH (ref 0.44–1.00)
GFR, Estimated: 21 mL/min — ABNORMAL LOW
Glucose, Bld: 121 mg/dL — ABNORMAL HIGH (ref 70–99)
Potassium: 4.9 mmol/L (ref 3.5–5.1)
Sodium: 136 mmol/L (ref 135–145)

## 2025-01-16 LAB — MAGNESIUM: Magnesium: 1.9 mg/dL (ref 1.7–2.4)

## 2025-01-16 LAB — HIV ANTIBODY (ROUTINE TESTING W REFLEX): HIV Screen 4th Generation wRfx: NONREACTIVE

## 2025-01-16 MED ORDER — LACTATED RINGERS IV SOLN: INTRAVENOUS | Status: DC

## 2025-01-16 MED ORDER — SODIUM CHLORIDE 0.45 % IV SOLN
INTRAVENOUS | Status: DC
  Filled 2025-01-16 (×2): qty 75

## 2025-01-16 NOTE — Plan of Care (Signed)
  Problem: Coping: Goal: Ability to adjust to condition or change in health will improve Outcome: Progressing   Problem: Fluid Volume: Goal: Ability to maintain a balanced intake and output will improve Outcome: Progressing   Problem: Health Behavior/Discharge Planning: Goal: Ability to identify and utilize available resources and services will improve Outcome: Progressing   Problem: Nutritional: Goal: Maintenance of adequate nutrition will improve Outcome: Progressing

## 2025-01-16 NOTE — Progress Notes (Signed)
" °  PROGRESS NOTE    Katie Gilbert  FMW:[id number] DOB: Aug 25, 1991 DOA: 01/14/2025 PCP: Orlean Alan HERO, FNP  153A/153A-AA  LOS: 1 day   Brief hospital course:   Assessment & Plan: Katie Gilbert is a 34 y.o. 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  She presented with a several day history of nausea and vomiting, poor oral intake associated with generalized weakness. At the present time she denies abdominal pain, fever or change in bowel habits or dysuria.  She does endorse lower extremity edema L>R that is chronic.  Denies shortness of breath. Of note, she has had prior hospitalizations for intractable symptoms, most recently November 2022.  EGD at that time showed a large volume of food residue.She is followed by GI at [facility], last seen September 2025 with plans for nuclear gastric emptying studies, not yet done.    N/V likely 2/2 Gastroparesis --improved.  D/c'ed IV reglan  --cont low-fat diet  Diarrhea, improved Acute gastroenteritis --started after presentation.  C diff and GI path neg.  Maybe due to IV Reglan . 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 Creatinine 2.78 up from baseline of 1.13 with bicarb 17 and normal anion gap Urinalysis with proteinuria --likely dehydration --cont MIVF  Anemia of chronic kidney failure, stage 2 (mild) Hemoglobin stable around 9.1  Bilateral lower extremity edema Appears chronic-no shortness of breath BNP elevated but this could be related to abnormal renal function Lower extremity venous ultrasound negative for DVT --Echo  Generalized anxiety disorder --cont Buspar   HTN --cont Nifedipine   --hold Lisinopril  due to AKI  Obesity, Class III, BMI 40-49.9 (morbid  obesity) (HCC) Increased risk of poor health outcomes   DVT prophylaxis: Lovenox  SQ Code Status: Full code  Family Communication:  Level of care: Med-Surg Dispo:   The patient is from: home Anticipated d/c is to: home Anticipated d/c date is: 2-3 days   Subjective and Interval History:  Pt reported diarrhea improved.  No N/V.  Tolerating low-fat diet.   Objective: Vitals:   01/16/25 0427 01/16/25 0820 01/16/25 1718 01/16/25 2028  BP: 130/80 (!) 142/86 (!) 155/94 139/88  Pulse: 90 90 89 88  Resp: 20 16 16 18   Temp: 97.8 F (36.6 C) 97.6 F (36.4 C) 98.2 F (36.8 C) 97.8 F (36.6 C)  TempSrc: Oral     SpO2: 99% 97% 95% 100%  Weight:      Height:        Intake/Output Summary (Last 24 hours) at 01/16/2025 2103 Last data filed at 01/16/2025 1519 Gross per 24 hour  Intake 590.84 ml  Output --  Net 590.84 ml   Filed Weights   01/14/25 1617 01/14/25 2151  Weight: 127 kg (!) 149 kg    Examination:   Constitutional: NAD, AAOx3 HEENT: conjunctivae and lids normal, EOMI CV: No cyanosis.   RESP: normal respiratory effort, on RA Extremities: tense edema in BLE SKIN: warm, dry Neuro: II - XII grossly intact.   Psych: Normal mood and affect.  Appropriate judgement and reason   Data Reviewed: I have personally reviewed labs and imaging studies  Time spent: 35 minutes  Ellouise Haber, MD [facility] If 7PM-7AM, please contact night-coverage 01/16/2025, 9:03 PM   "

## 2025-01-17 ENCOUNTER — Inpatient Hospital Stay

## 2025-01-17 DIAGNOSIS — K3184 Gastroparesis: Secondary | ICD-10-CM | POA: Diagnosis not present

## 2025-01-17 DIAGNOSIS — E1143 Type 2 diabetes mellitus with diabetic autonomic (poly)neuropathy: Secondary | ICD-10-CM | POA: Diagnosis not present

## 2025-01-17 LAB — BASIC METABOLIC PANEL WITH GFR
Anion gap: 7 (ref 5–15)
BUN: 31 mg/dL — ABNORMAL HIGH (ref 6–20)
CO2: 18 mmol/L — ABNORMAL LOW (ref 22–32)
Calcium: 7.3 mg/dL — ABNORMAL LOW (ref 8.9–10.3)
Chloride: 113 mmol/L — ABNORMAL HIGH (ref 98–111)
Creatinine, Ser: 3 mg/dL — ABNORMAL HIGH (ref 0.44–1.00)
GFR, Estimated: 20 mL/min — ABNORMAL LOW
Glucose, Bld: 123 mg/dL — ABNORMAL HIGH (ref 70–99)
Potassium: 4.7 mmol/L (ref 3.5–5.1)
Sodium: 138 mmol/L (ref 135–145)

## 2025-01-17 MED ORDER — PANTOPRAZOLE SODIUM 40 MG PO TBEC
40.0000 mg | DELAYED_RELEASE_TABLET | Freq: Every day | ORAL | Status: AC
  Administered 2025-01-17 – 2025-01-23 (×7): 40 mg via ORAL
  Filled 2025-01-17 (×7): qty 1

## 2025-01-17 MED ORDER — FUROSEMIDE 10 MG/ML IJ SOLN
40.0000 mg | Freq: Once | INTRAMUSCULAR | Status: AC
  Administered 2025-01-17: 40 mg via INTRAVENOUS
  Filled 2025-01-17: qty 4

## 2025-01-17 NOTE — Progress Notes (Signed)
 Patient c/o of left arm getting heavy. On assessment left arm swollen comparing to right arm. IV sodium bicarb stopped, no c/o of pain and no IV infiltration noted. MD notified.

## 2025-01-17 NOTE — Progress Notes (Signed)
" °  PROGRESS NOTE    Katie Gilbert  FMW:[id number] DOB: September 11, 1991 DOA: 01/14/2025 PCP: Orlean Alan HERO, FNP  153A/153A-AA  LOS: 2 days   Brief hospital course:   Assessment & Plan: Katie Gilbert is a 34 y.o. 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  She presented with a several day history of nausea and vomiting, poor oral intake associated with generalized weakness. At the present time she denies abdominal pain, fever or change in bowel habits or dysuria.  She does endorse lower extremity edema L>R that is chronic.  Denies shortness of breath. Of note, she has had prior hospitalizations for intractable symptoms, most recently November 2022.  EGD at that time showed a large volume of food residue.She is followed by GI at [facility], last seen September 2025 with plans for nuclear gastric emptying studies, not yet done.    N/V likely 2/2 Gastroparesis --improved.  D/c'ed IV reglan  --cont low-fat diet  Diarrhea, improved Acute gastroenteritis --started after presentation.  C diff and GI path neg.  Maybe due to IV Reglan . 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 Creatinine 2.78 up from baseline of 1.13 with bicarb 17 and normal anion gap Urinalysis with proteinuria --hold further IVF --US  renal  Anemia of chronic kidney failure, stage 2 (mild) Hemoglobin stable around 9.1  Bilateral lower extremity edema Appears chronic-no shortness of breath BNP elevated but this could be related to abnormal renal function Lower extremity venous ultrasound negative for DVT --Echo --IV lasix  40 BID today  Generalized anxiety disorder --cont Buspar   HTN --cont Nifedipine   --hold Lisinopril   Obesity, Class III, BMI 40-49.9  (morbid obesity) (HCC) Increased risk of poor health outcomes   DVT prophylaxis: Lovenox  SQ Code Status: Full code  Family Communication:  Level of care: Med-Surg Dispo:   The patient is from: home Anticipated d/c is to: home Anticipated d/c date is: 2-3 days   Subjective and Interval History:  Pt reported diarrhea much improved.  Tolerating diet.  More swelling in left arm, though no pain.   Objective: Vitals:   01/16/25 2028 01/17/25 0518 01/17/25 0852 01/17/25 1639  BP: 139/88 106/70 (!) 140/81 132/75  Pulse: 88 91 90 89  Resp: 18 18 17 17   Temp: 97.8 F (36.6 C) 98.1 F (36.7 C) 98.3 F (36.8 C) 98.2 F (36.8 C)  TempSrc:      SpO2: 100% 95% 99% 100%  Weight:      Height:        Intake/Output Summary (Last 24 hours) at 01/17/2025 1855 Last data filed at 01/17/2025 0900 Gross per 24 hour  Intake 240 ml  Output --  Net 240 ml   Filed Weights   01/14/25 1617 01/14/25 2151  Weight: 127 kg (!) 149 kg    Examination:   Constitutional: NAD, AAOx3 HEENT: conjunctivae and lids normal, EOMI CV: No cyanosis.   RESP: normal respiratory effort, on RA Extremities: tense edema in BLE.  Left arm more swollen SKIN: warm, dry Neuro: II - XII grossly intact.   Psych: Normal mood and affect.  Appropriate judgement and reason   Data Reviewed: I have personally reviewed labs and imaging studies  Time spent: 35 minutes  Katie Haber, MD [facility] If 7PM-7AM, please contact night-coverage 01/17/2025, 6:55 PM   "

## 2025-01-17 NOTE — Plan of Care (Signed)
   Problem: Coping: Goal: Ability to adjust to condition or change in health will improve Outcome: Progressing   Problem: Fluid Volume: Goal: Ability to maintain a balanced intake and output will improve Outcome: Progressing   Problem: Health Behavior/Discharge Planning: Goal: Ability to identify and utilize available resources and services will improve Outcome: Progressing

## 2025-01-17 NOTE — Progress Notes (Signed)
 PHARMACIST - PHYSICIAN COMMUNICATION  DR:   Awanda  CONCERNING: IV to Oral Route Change Policy  RECOMMENDATION: This patient is receiving pantoprazole  by the intravenous route.  Based on criteria approved by the Pharmacy and Therapeutics Committee, the intravenous medication(s) is/are being converted to the equivalent oral dose form(s).   DESCRIPTION: These criteria include: The patient is eating (either orally or via tube) and/or has been taking other orally administered medications for a least 24 hours The patient has no evidence of active gastrointestinal bleeding or impaired GI absorption (gastrectomy, short bowel, patient on TNA or NPO).  If you have questions about this conversion, please contact the Pharmacy Department  []   [phone] )  [name] [x]   [phone] )  [facility] []   [phone] )  [facility] []   [phone] )  [facility] []   [phone] )  [facility], [facility] 01/17/2025 10:45 AM

## 2025-01-18 ENCOUNTER — Inpatient Hospital Stay: Admit: 2025-01-18 | Discharge: 2025-01-18 | Disposition: A | Attending: Hospitalist

## 2025-01-18 DIAGNOSIS — E1143 Type 2 diabetes mellitus with diabetic autonomic (poly)neuropathy: Secondary | ICD-10-CM | POA: Diagnosis not present

## 2025-01-18 DIAGNOSIS — K3184 Gastroparesis: Secondary | ICD-10-CM | POA: Diagnosis not present

## 2025-01-18 LAB — ECHOCARDIOGRAM COMPLETE
AR max vel: 2.66 cm2
AV Peak grad: 9.4 mmHg
Ao pk vel: 1.53 m/s
Area-P 1/2: 3.85 cm2
Height: 75 in
P 1/2 time: 518 ms
S' Lateral: 3.2 cm
Weight: 5255.77 [oz_av]

## 2025-01-18 LAB — BASIC METABOLIC PANEL WITH GFR
Anion gap: 7 (ref 5–15)
BUN: 34 mg/dL — ABNORMAL HIGH (ref 6–20)
CO2: 18 mmol/L — ABNORMAL LOW (ref 22–32)
Calcium: 7.6 mg/dL — ABNORMAL LOW (ref 8.9–10.3)
Chloride: 113 mmol/L — ABNORMAL HIGH (ref 98–111)
Creatinine, Ser: 3.14 mg/dL — ABNORMAL HIGH (ref 0.44–1.00)
GFR, Estimated: 19 mL/min — ABNORMAL LOW
Glucose, Bld: 107 mg/dL — ABNORMAL HIGH (ref 70–99)
Potassium: 5.2 mmol/L — ABNORMAL HIGH (ref 3.5–5.1)
Sodium: 138 mmol/L (ref 135–145)

## 2025-01-18 LAB — MAGNESIUM: Magnesium: 2 mg/dL (ref 1.7–2.4)

## 2025-01-18 MED ORDER — FUROSEMIDE 10 MG/ML IJ SOLN
80.0000 mg | Freq: Once | INTRAMUSCULAR | Status: AC
  Administered 2025-01-18: 80 mg via INTRAVENOUS
  Filled 2025-01-18: qty 8

## 2025-01-18 MED ORDER — ENOXAPARIN SODIUM 80 MG/0.8ML IJ SOSY
0.5000 mg/kg | PREFILLED_SYRINGE | INTRAMUSCULAR | Status: AC
  Administered 2025-01-18 – 2025-01-23 (×6): 75 mg via SUBCUTANEOUS
  Filled 2025-01-18 (×6): qty 0.75

## 2025-01-18 NOTE — Progress Notes (Signed)
" °  Echocardiogram 2D Echocardiogram has been performed.  Katie Gilbert 01/18/2025, 10:56 AM "

## 2025-01-18 NOTE — Progress Notes (Signed)
 " PROGRESS NOTE    Katie Gilbert  FMW:[id number] DOB: 22-Dec-1990 DOA: 01/14/2025 PCP: Orlean Alan HERO, FNP  153A/153A-AA  LOS: 3 days   Brief hospital course:   Assessment & Plan: Katie Gilbert is a 34 y.o. 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  She presented with a several day history of nausea and vomiting, poor oral intake associated with generalized weakness. At the present time she denies abdominal pain, fever or change in bowel habits or dysuria.  She does endorse lower extremity edema L>R that is chronic.  Denies shortness of breath. Of note, she has had prior hospitalizations for intractable symptoms, most recently November 2022.  EGD at that time showed a large volume of food residue.She is followed by GI at [facility], last seen September 2025 with plans for nuclear gastric emptying studies, not yet done.    N/V likely 2/2 Gastroparesis --improved.  D/c'ed IV reglan  --cont low-fat diet  Diarrhea, improved Acute gastroenteritis --started after presentation.  C diff and GI path neg.  Maybe due to IV Reglan . 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 Creatinine 2.78 up from baseline of 1.13 with bicarb 17 and normal anion gap Urinalysis with proteinuria --US  renal remarkable --hold further MIVF  Anemia of chronic kidney failure, stage 2 (mild) Hemoglobin stable around 9.1  Bilateral lower extremity edema Mild pulm edema Appears chronic-no shortness of breath BNP elevated but this could be related to abnormal renal function Lower extremity venous ultrasound negative for DVT --Echo --IV lasix  80 today  Generalized anxiety disorder --cont Buspar   HTN --cont Nifedipine   --hold Lisinopril   Obesity,  Class III, BMI 40-49.9 (morbid obesity) (HCC) Increased risk of poor health outcomes   DVT prophylaxis: Lovenox  SQ Code Status: Full code  Family Communication:  Level of care: Med-Surg Dispo:   The patient is from: home Anticipated d/c is to: home Anticipated d/c date is: 2-3 days   Subjective and Interval History:  No increased urine output in response to IV lasix  40.   Objective: Vitals:   01/17/25 2103 01/18/25 0406 01/18/25 0820 01/18/25 1728  BP: 106/73 133/80 (!) 157/92 (!) 145/93  Pulse: 88 78 95 87  Resp: 18 18 18 18   Temp: 97.6 F (36.4 C) (!) 97.5 F (36.4 C) (!) 97.4 F (36.3 C) 97.6 F (36.4 C)  TempSrc:      SpO2: 98% 93% 100% 100%  Weight:      Height:        Intake/Output Summary (Last 24 hours) at 01/18/2025 1844 Last data filed at 01/18/2025 1300 Gross per 24 hour  Intake 480 ml  Output --  Net 480 ml   Filed Weights   01/14/25 1617 01/14/25 2151  Weight: 127 kg (!) 149 kg    Examination:   Constitutional: NAD, AAOx3 HEENT: conjunctivae and lids normal, EOMI CV: No cyanosis.   RESP: normal respiratory effort, on RA Extremities: tense edema in BLE.  Left arm more swollen than right SKIN: warm, dry Neuro: II - XII grossly intact.   Psych: Normal mood and affect.  Appropriate judgement and reason   Data Reviewed: I have personally reviewed labs and imaging studies  Time spent: 35 minutes  Ellouise Haber, MD [facility] If 7PM-7AM, please contact night-coverage 01/18/2025, 6:44 PM   "

## 2025-01-18 NOTE — Plan of Care (Signed)

## 2025-01-19 DIAGNOSIS — E1143 Type 2 diabetes mellitus with diabetic autonomic (poly)neuropathy: Secondary | ICD-10-CM | POA: Diagnosis not present

## 2025-01-19 DIAGNOSIS — K3184 Gastroparesis: Secondary | ICD-10-CM | POA: Diagnosis not present

## 2025-01-19 LAB — BASIC METABOLIC PANEL WITH GFR
Anion gap: 8 (ref 5–15)
BUN: 35 mg/dL — ABNORMAL HIGH (ref 6–20)
CO2: 18 mmol/L — ABNORMAL LOW (ref 22–32)
Calcium: 7.7 mg/dL — ABNORMAL LOW (ref 8.9–10.3)
Chloride: 112 mmol/L — ABNORMAL HIGH (ref 98–111)
Creatinine, Ser: 3.1 mg/dL — ABNORMAL HIGH (ref 0.44–1.00)
GFR, Estimated: 20 mL/min — ABNORMAL LOW
Glucose, Bld: 136 mg/dL — ABNORMAL HIGH (ref 70–99)
Potassium: 5 mmol/L (ref 3.5–5.1)
Sodium: 138 mmol/L (ref 135–145)

## 2025-01-19 LAB — MAGNESIUM: Magnesium: 2.1 mg/dL (ref 1.7–2.4)

## 2025-01-19 MED ORDER — FUROSEMIDE 10 MG/ML IJ SOLN
80.0000 mg | Freq: Once | INTRAMUSCULAR | Status: AC
  Administered 2025-01-19: 80 mg via INTRAVENOUS
  Filled 2025-01-19: qty 8

## 2025-01-19 NOTE — Plan of Care (Signed)

## 2025-01-19 NOTE — Progress Notes (Signed)
" °  PROGRESS NOTE    Katie Gilbert  FMW:[id number] DOB: November 13, 1991 DOA: 01/14/2025 PCP: Orlean Alan HERO, FNP  153A/153A-AA  LOS: 4 days   Brief hospital course:   Assessment & Plan: Katie Gilbert is a 34 y.o. 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  She presented with a several day history of nausea and vomiting, poor oral intake associated with generalized weakness. At the present time she denies abdominal pain, fever or change in bowel habits or dysuria.  She does endorse lower extremity edema L>R that is chronic.  Denies shortness of breath. Of note, she has had prior hospitalizations for intractable symptoms, most recently November 2022.  EGD at that time showed a large volume of food residue.She is followed by GI at [facility], last seen September 2025 with plans for nuclear gastric emptying studies, not yet done.    N/V likely 2/2 Gastroparesis --improved.  D/c'ed IV reglan  --cont low-fat diet  Diarrhea, improved Acute gastroenteritis --started after presentation.  C diff and GI path neg.  Maybe due to IV Reglan . 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 Creatinine 2.78 up from baseline of 1.13 with bicarb 17 and normal anion gap Urinalysis with proteinuria --US  renal remarkable --hold IVF  Anemia of chronic kidney failure, stage 2 (mild) Hemoglobin stable around 9.1  Bilateral lower extremity edema Mild pulm edema Appears chronic-no shortness of breath BNP elevated but this could be related to abnormal renal function Lower extremity venous ultrasound negative for DVT --Echo --IV lasix  80 x2 today  Generalized anxiety disorder --cont Buspar   HTN --cont Nifedipine   --hold Lisinopril   Obesity, Class  III, BMI 40-49.9 (morbid obesity) (HCC) Increased risk of poor health outcomes  Hyperkalemia --resolved with IV lasix    DVT prophylaxis: Lovenox  SQ Code Status: Full code  Family Communication:  Level of care: Med-Surg Dispo:   The patient is from: home Anticipated d/c is to: home Anticipated d/c date is: 2-3 days   Subjective and Interval History:  Pt reported maybe making more urine.   Objective: Vitals:   01/18/25 2027 01/19/25 0447 01/19/25 0811 01/19/25 1547  BP: 121/81 118/80 (!) 126/92 (!) 156/92  Pulse: 85 78 78   Resp: 18 18 16 16   Temp: (!) 97.5 F (36.4 C) 98.4 F (36.9 C) (!) 97.4 F (36.3 C) (!) 97.4 F (36.3 C)  TempSrc:      SpO2: 98% 98% 96%   Weight:      Height:        Intake/Output Summary (Last 24 hours) at 01/19/2025 2042 Last data filed at 01/19/2025 1900 Gross per 24 hour  Intake 240 ml  Output 2500 ml  Net -2260 ml   Filed Weights   01/14/25 1617 01/14/25 2151  Weight: 127 kg (!) 149 kg    Examination:   Constitutional: NAD, AAOx3 HEENT: conjunctivae and lids normal, EOMI CV: No cyanosis.   RESP: normal respiratory effort, on RA Extremities: tense edema in BLE SKIN: warm, dry Neuro: II - XII grossly intact.   Psych: Normal mood and affect.  Appropriate judgement and reason   Data Reviewed: I have personally reviewed labs and imaging studies  Time spent: 35 minutes  Katie Haber, MD [facility] If 7PM-7AM, please contact night-coverage 01/19/2025, 8:42 PM   "

## 2025-01-20 ENCOUNTER — Encounter: Payer: Self-pay | Admitting: Family

## 2025-01-20 DIAGNOSIS — K3184 Gastroparesis: Secondary | ICD-10-CM | POA: Diagnosis not present

## 2025-01-20 DIAGNOSIS — E1143 Type 2 diabetes mellitus with diabetic autonomic (poly)neuropathy: Secondary | ICD-10-CM | POA: Diagnosis not present

## 2025-01-20 LAB — BASIC METABOLIC PANEL WITH GFR
Anion gap: 11 (ref 5–15)
BUN: 39 mg/dL — ABNORMAL HIGH (ref 6–20)
CO2: 19 mmol/L — ABNORMAL LOW (ref 22–32)
Calcium: 8.2 mg/dL — ABNORMAL LOW (ref 8.9–10.3)
Chloride: 109 mmol/L (ref 98–111)
Creatinine, Ser: 3.26 mg/dL — ABNORMAL HIGH (ref 0.44–1.00)
GFR, Estimated: 18 mL/min — ABNORMAL LOW
Glucose, Bld: 118 mg/dL — ABNORMAL HIGH (ref 70–99)
Potassium: 4.9 mmol/L (ref 3.5–5.1)
Sodium: 139 mmol/L (ref 135–145)

## 2025-01-20 MED ORDER — FUROSEMIDE 10 MG/ML IJ SOLN
8.0000 mg/h | INTRAVENOUS | Status: AC
  Administered 2025-01-20 – 2025-01-22 (×2): 6 mg/h via INTRAVENOUS
  Administered 2025-01-23: 8 mg/h via INTRAVENOUS
  Filled 2025-01-20 (×4): qty 20

## 2025-01-20 MED ORDER — ALBUMIN HUMAN 25 % IV SOLN
25.0000 g | Freq: Three times a day (TID) | INTRAVENOUS | Status: AC
  Administered 2025-01-20 – 2025-01-23 (×10): 25 g via INTRAVENOUS
  Filled 2025-01-20 (×11): qty 100

## 2025-01-20 MED ORDER — ALBUMIN HUMAN 25 % IV SOLN
25.0000 g | Freq: Three times a day (TID) | INTRAVENOUS | Status: DC
  Filled 2025-01-20: qty 100

## 2025-01-20 NOTE — Assessment & Plan Note (Signed)
 Sending referral to different GI clinic.  Will defer to them for further treatment decisions.

## 2025-01-20 NOTE — Plan of Care (Signed)

## 2025-01-20 NOTE — Plan of Care (Signed)
" °  Problem: Coping: Goal: Ability to adjust to condition or change in health will improve Outcome: Progressing   Problem: Fluid Volume: Goal: Ability to maintain a balanced intake and output will improve Outcome: Progressing   Problem: Metabolic: Goal: Ability to maintain appropriate glucose levels will improve Outcome: Progressing   Problem: Nutritional: Goal: Maintenance of adequate nutrition will improve Outcome: Progressing   Problem: Skin Integrity: Goal: Risk for impaired skin integrity will decrease Outcome: Progressing   Problem: Education: Goal: Knowledge of General Education information will improve Description: Including pain rating scale, medication(s)/side effects and non-pharmacologic comfort measures Outcome: Progressing   Problem: Clinical Measurements: Goal: Will remain free from infection Outcome: Progressing   Problem: Activity: Goal: Risk for activity intolerance will decrease Outcome: Progressing   "

## 2025-01-20 NOTE — Plan of Care (Signed)
   Problem: Coping: Goal: Ability to adjust to condition or change in health will improve Outcome: Progressing

## 2025-01-21 DIAGNOSIS — E1143 Type 2 diabetes mellitus with diabetic autonomic (poly)neuropathy: Secondary | ICD-10-CM | POA: Diagnosis not present

## 2025-01-21 DIAGNOSIS — K3184 Gastroparesis: Secondary | ICD-10-CM | POA: Diagnosis not present

## 2025-01-21 LAB — MAGNESIUM: Magnesium: 2.1 mg/dL (ref 1.7–2.4)

## 2025-01-21 LAB — BASIC METABOLIC PANEL WITH GFR
Anion gap: 10 (ref 5–15)
BUN: 40 mg/dL — ABNORMAL HIGH (ref 6–20)
CO2: 19 mmol/L — ABNORMAL LOW (ref 22–32)
Calcium: 8.1 mg/dL — ABNORMAL LOW (ref 8.9–10.3)
Chloride: 111 mmol/L (ref 98–111)
Creatinine, Ser: 3.3 mg/dL — ABNORMAL HIGH (ref 0.44–1.00)
GFR, Estimated: 18 mL/min — ABNORMAL LOW
Glucose, Bld: 111 mg/dL — ABNORMAL HIGH (ref 70–99)
Potassium: 5.7 mmol/L — ABNORMAL HIGH (ref 3.5–5.1)
Sodium: 139 mmol/L (ref 135–145)

## 2025-01-21 LAB — PROTEIN / CREATININE RATIO, URINE
Creatinine, Urine: 44 mg/dL
Protein Creatinine Ratio: 8.1 mg/mg — ABNORMAL HIGH
Total Protein, Urine: 355 mg/dL

## 2025-01-21 LAB — HIV ANTIBODY (ROUTINE TESTING W REFLEX): HIV Screen 4th Generation wRfx: NONREACTIVE

## 2025-01-21 NOTE — Consult Note (Signed)
 " [facility]  CONSULT NOTE    Date: 01/21/2025                  Patient Name:  Katie Gilbert  MRN: [id number]  DOB: 08-Apr-1991  Age / Sex: 34 y.o., female         PCP: Orlean Alan HERO, FNP                 Service Requesting Consult: [facility]                 Reason for Consult: Acute kidney injury with proteinuria            History of Present Illness: Katie Gilbert is a 34 y.o.  female with past medical conditions including diabetes, hypertension, diabetic neuropathy, gastroparesis, anemia, and nephropathy, who was admitted to [facility] on 01/14/2025 for Leg swelling [M79.89] AKI (acute kidney injury) [N17.9] Left leg swelling [M79.89] Right leg swelling [M79.89] Nausea and vomiting, unspecified vomiting type [R11.2]  Patient seen resting in bed.  States she has had lower extremity edema for months.  Denies shortness of breath.  States she follows a nephrologist, unable to provide name.  Also endorses days of poor oral intake with nausea, vomiting, and weakness.  No known fever or chills.  Renal function on ED arrival shows creatinine 2.78 with GFR 22, BNP greater than 22,000, and hemoglobin 9.4.  UA appears turbid thank with proteinuria and bacteria.  C. difficile negative.  Renal ultrasound negative for obstruction, small bilateral pleural effusions seen.  Chest x-ray shows mild pulmonary edema with bilateral pleural effusions.  Creatinine has slowly increased over the course of this admission, 3.30 today.   Medications: Outpatient medications: Medications Prior to Admission  Medication Sig Dispense Refill Last Dose/Taking   atorvastatin  (LIPITOR) 40 MG tablet Take 40 mg by mouth daily.   Past Week   chlorthalidone (HYGROTON) 25 MG tablet Take 1 tablet by mouth daily.   Past Week   empagliflozin  (JARDIANCE ) 10 MG TABS tablet Take 10 mg by mouth daily.   Past Week   hydrOXYzine  (VISTARIL ) 25 MG capsule Take 1 capsule (25 mg total) by mouth 3 (three) times  daily as needed for anxiety (insomnia). 90 capsule 0 Unknown   lisinopril  (ZESTRIL ) 10 MG tablet Take 10 mg by mouth daily.   Past Week   metFORMIN  (GLUCOPHAGE ) 500 MG tablet Take 500 mg by mouth 2 (two) times daily with a meal.   Past Week   NIFEdipine  (PROCARDIA  XL/NIFEDICAL XL) 60 MG 24 hr tablet Take 60 mg by mouth daily.   Past Week   omeprazole (PRILOSEC) 40 MG capsule Take 40 mg by mouth daily.   Past Week   ondansetron  (ZOFRAN -ODT) 4 MG disintegrating tablet Take 4 mg by mouth every 8 (eight) hours as needed for nausea.   Unknown   Vitamin D , Ergocalciferol , (DRISDOL ) 1.25 MG (50000 UNIT) CAPS capsule Take 1 capsule (50,000 Units total) by mouth every 7 (seven) days. 12 capsule 3 Past Week   blood glucose meter kit and supplies KIT Dispense based on patient and insurance preference. Use up to four times daily as directed. (FOR ICD-9 250.00, 250.01). (Patient not taking: Reported on 10/05/2023) 1 each 0    busPIRone  (BUSPAR ) 5 MG tablet Take 1 tablet (5 mg total) by mouth 3 (three) times daily. (Patient taking differently: Take 5 mg by mouth as needed.) 90 tablet 1    DULoxetine  (CYMBALTA ) 20 MG capsule Take 1  capsule (20 mg total) by mouth daily. (Patient not taking: Reported on 10/05/2023) 30 capsule 0    ferrous sulfate  325 (65 FE) MG EC tablet Take 325 mg by mouth every other day. (Patient not taking: No sig reported)      furosemide  (LASIX ) 20 MG tablet Take one tablet every morning for 4 days, then take as needed (Patient not taking: Reported on 04/08/2024) 30 tablet 11    hydrochlorothiazide  (HYDRODIURIL ) 12.5 MG tablet Take 1 tablet (12.5 mg total) by mouth daily. (Patient not taking: Reported on 10/05/2023) 30 tablet 1     Current medications: Current Facility-Administered Medications  Medication Dose Route Frequency Provider Last Rate Last Admin   acetaminophen  (TYLENOL ) tablet 650 mg  650 mg Oral Q6H PRN Duncan, Hazel V, MD       Or   acetaminophen  (TYLENOL ) suppository 650 mg   650 mg Rectal Q6H PRN Cleatus Delayne GAILS, MD       albumin  human 25 % solution 25 g  25 g Intravenous Q8H Lai, Tina, MD 60 mL/hr at 01/21/25 1534 25 g at 01/21/25 1534   atorvastatin  (LIPITOR) tablet 40 mg  40 mg Oral Daily Duncan, Hazel V, MD   40 mg at 01/21/25 1029   busPIRone  (BUSPAR ) tablet 5 mg  5 mg Oral BID Duncan, Hazel V, MD   5 mg at 01/21/25 1030   enoxaparin  (LOVENOX ) injection 75 mg  0.5 mg/kg Subcutaneous Q24H Hunt, Madison H, RPH   75 mg at 01/20/25 2105   furosemide  (LASIX ) 200 mg in dextrose  5 % 100 mL (2 mg/mL) infusion  6 mg/hr Intravenous Continuous Awanda [place], MD 3 mL/hr at 01/20/25 1850 6 mg/hr at 01/20/25 1850   hydrALAZINE  (APRESOLINE ) injection 10 mg  10 mg Intravenous Q6H PRN Cleatus Delayne GAILS, MD   10 mg at 01/14/25 2103   HYDROcodone -acetaminophen  (NORCO/VICODIN) 5-325 MG per tablet 1-2 tablet  1-2 tablet Oral Q4H PRN Duncan, Hazel V, MD   2 tablet at 01/21/25 1507   hydrOXYzine  (ATARAX ) tablet 25 mg  25 mg Oral TID PRN Duncan, Hazel V, MD   25 mg at 01/20/25 2220   NIFEdipine  (ADALAT  CC) 24 hr tablet 60 mg  60 mg Oral Daily Duncan, Hazel V, MD   60 mg at 01/21/25 1030   ondansetron  (ZOFRAN ) tablet 4 mg  4 mg Oral Q6H PRN Duncan, Hazel V, MD       Or   ondansetron  (ZOFRAN ) injection 4 mg  4 mg Intravenous Q6H PRN Cleatus Delayne GAILS, MD       pantoprazole  (PROTONIX ) EC tablet 40 mg  40 mg Oral QHS Hunt, Madison H, RPH   40 mg at 01/20/25 2106   promethazine  (PHENERGAN ) 12.5 mg in sodium chloride  0.9 % 50 mL IVPB  12.5 mg Intravenous Q6H PRN Duncan, Hazel V, MD          Allergies: Allergies[1]    Past Medical History: Past Medical History:  Diagnosis Date   C. difficile enteritis 07/24/2021   Diarrhea 07/22/2021   Genital herpes    Nausea & vomiting 07/22/2021   Type II diabetes mellitus with renal manifestations [facility])      Past Surgical History: Past Surgical History:  Procedure Laterality Date   FOOT SURGERY Right    I & D EXTREMITY Right 01/17/2019    Procedure: IRRIGATION AND DEBRIDEMENT RIGHT FOOT;  Surgeon: Ashley Soulier, DPM;  Location: [facility] ORS;  Service: Podiatry;  Laterality: Right;     Family History: Family  History  Problem Relation Age of Onset   Hypertension Father    Heart attack Father    Diabetes Father    Diabetes Maternal Grandmother      Social History: Social History   Socioeconomic History   Marital status: Single    Spouse name: Not on file   Number of children: Not on file   Years of education: Not on file   Highest education level: Not on file  Occupational History   Not on file  Tobacco Use   Smoking status: Light Smoker    Current packs/day: 0.20    Types: Cigarettes   Smokeless tobacco: Never  Vaping Use   Vaping status: Never Used  Substance and Sexual Activity   Alcohol use: No   Drug use: No   Sexual activity: Not on file  Other Topics Concern   Not on file  Social History Narrative   Not on file   Social Drivers of Health   Tobacco Use: High Risk (01/20/2025)   Patient History    Smoking Tobacco Use: Light Smoker    Smokeless Tobacco Use: Never    Passive Exposure: Not on file  Financial Resource Strain: Not on file  Food Insecurity: No Food Insecurity (01/15/2025)   Epic    Worried About Programme Researcher, Broadcasting/film/video in the Last Year: Never true    Ran Out of Food in the Last Year: Never true  Transportation Needs: No Transportation Needs (01/15/2025)   Epic    Lack of Transportation (Medical): No    Lack of Transportation (Non-Medical): No  Physical Activity: Not on file  Stress: Not on file  Social Connections: Not on file  Intimate Partner Violence: Not At Risk (01/15/2025)   Epic    Fear of Current or Ex-Partner: No    Emotionally Abused: No    Physically Abused: No    Sexually Abused: No  Depression (PHQ2-9): High Risk (05/09/2024)   Depression (PHQ2-9)    PHQ-2 Score: 13  Alcohol Screen: Not on file  Housing: Low Risk (01/15/2025)   Epic    Unable to Pay for Housing in the  Last Year: No    Number of Times Moved in the Last Year: 1    Homeless in the Last Year: No  Utilities: Not At Risk (01/15/2025)   Epic    Threatened with loss of utilities: No  Health Literacy: Not on file     Review of Systems: Review of Systems  Constitutional:  Negative for chills, fever and malaise/fatigue.  HENT:  Negative for congestion, sore throat and tinnitus.   Eyes:  Negative for blurred vision and redness.  Respiratory:  Negative for cough, shortness of breath and wheezing.   Cardiovascular:  Positive for leg swelling. Negative for chest pain, palpitations and claudication.  Gastrointestinal:  Positive for nausea and vomiting. Negative for abdominal pain, blood in stool and diarrhea.  Genitourinary:  Negative for flank pain, frequency and hematuria.  Musculoskeletal:  Negative for back pain, falls and myalgias.  Skin:  Negative for rash.  Neurological:  Negative for dizziness, weakness and headaches.  Endo/Heme/Allergies:  Does not bruise/bleed easily.  Psychiatric/Behavioral:  Negative for depression. The patient is not nervous/anxious and does not have insomnia.     Vital Signs: Blood pressure (!) 147/97, pulse 85, temperature 98.1 F (36.7 C), temperature source Oral, resp. rate 18, height 6' 3 (1.905 m), weight (!) 149 kg, last menstrual period 12/16/2024, SpO2 99%.  Weight trends: [employer]  01/14/25 1617 01/14/25 2151  Weight: 127 kg (!) 149 kg    Physical Exam: General: NAD,   Head: Normocephalic, atraumatic. Moist oral mucosal membranes  Eyes: Anicteric  Lungs:  Clear to auscultation  Heart: Regular rate and rhythm  Abdomen:  Soft, nontender,   Extremities: Nonpitting peripheral edema.  Neurologic: Awake and alert  Skin: No lesions  Access: None     Lab results: Basic Metabolic Panel: Recent Labs  Lab 01/16/25 0711 01/17/25 0820 01/18/25 0738 01/19/25 0450 01/20/25 0508 01/21/25 0553  NA 136   < > 138 138 139 139  K 4.9   < > 5.2*  5.0 4.9 5.7*  CL 113*   < > 113* 112* 109 111  CO2 18*   < > 18* 18* 19* 19*  GLUCOSE 121*   < > 107* 136* 118* 111*  BUN 31*   < > 34* 35* 39* 40*  CREATININE 2.92*   < > 3.14* 3.10* 3.26* 3.30*  CALCIUM  7.6*   < > 7.6* 7.7* 8.2* 8.1*  MG 1.9  --  2.0 2.1  --  2.1   < > = values in this interval not displayed.    Liver Function Tests: Recent Labs  Lab 01/14/25 1724 01/15/25 0852  AST 18 27  ALT 12 15  ALKPHOS 99 109  BILITOT 0.2 <0.2  PROT 6.0* 5.9*  ALBUMIN  2.7* 2.5*   Recent Labs  Lab 01/14/25 1724  LIPASE 10*   No results for input(s): AMMONIA in the last 168 hours.  CBC: Recent Labs  Lab 01/14/25 1724 01/15/25 0852  WBC 8.0 8.3  HGB 9.4* 9.7*  HCT 30.4* 31.6*  MCV 85.6 86.3  PLT 343 337    Cardiac Enzymes: No results for input(s): CKTOTAL, CKMB, CKMBINDEX, TROPONINI in the last 168 hours.  BNP: Invalid input(s): POCBNP  CBG: Recent Labs  Lab 01/14/25 2150 01/15/25 0110 01/15/25 0425 01/15/25 0826 01/15/25 2148  GLUCAP 101* 122* 86 109* 198*    Microbiology: Results for orders placed or performed during the hospital encounter of 01/14/25  Gastrointestinal Panel by PCR , Stool     Status: None   Collection Time: 01/15/25  1:39 AM   Specimen: Stool  Result Value Ref Range Status   Campylobacter species NOT DETECTED NOT DETECTED Final   Plesimonas shigelloides NOT DETECTED NOT DETECTED Final   Salmonella species NOT DETECTED NOT DETECTED Final   Yersinia enterocolitica NOT DETECTED NOT DETECTED Final   Vibrio species NOT DETECTED NOT DETECTED Final   Vibrio cholerae NOT DETECTED NOT DETECTED Final   Enteroaggregative E coli (EAEC) NOT DETECTED NOT DETECTED Final   Enteropathogenic E coli (EPEC) NOT DETECTED NOT DETECTED Final   Enterotoxigenic E coli (ETEC) NOT DETECTED NOT DETECTED Final   Shiga like toxin producing E coli (STEC) NOT DETECTED NOT DETECTED Final   Shigella/Enteroinvasive E coli (EIEC) NOT DETECTED NOT DETECTED  Final   Cryptosporidium NOT DETECTED NOT DETECTED Final   Cyclospora cayetanensis NOT DETECTED NOT DETECTED Final   Entamoeba histolytica NOT DETECTED NOT DETECTED Final   Giardia lamblia NOT DETECTED NOT DETECTED Final   Adenovirus F40/41 NOT DETECTED NOT DETECTED Final   Astrovirus NOT DETECTED NOT DETECTED Final   Norovirus GI/GII NOT DETECTED NOT DETECTED Final   Rotavirus A NOT DETECTED NOT DETECTED Final   Sapovirus (I, II, IV, and V) NOT DETECTED NOT DETECTED Final    Comment: Performed at [facility], [address] [place], [place] [address]  C  Difficile Quick Screen w PCR reflex     Status: None   Collection Time: 01/15/25  1:39 AM   Specimen: STOOL  Result Value Ref Range Status   C Diff antigen NEGATIVE NEGATIVE Final   C Diff toxin NEGATIVE NEGATIVE Final   C Diff interpretation No C. difficile detected.  Final    Comment: Performed at [facility], [address] [place], [place] [address]    Coagulation Studies: No results for input(s): LABPROT, INR in the last 72 hours.  Urinalysis: No results for input(s): COLORURINE, LABSPEC, PHURINE, GLUCOSEU, HGBUR, BILIRUBINUR, KETONESUR, PROTEINUR, UROBILINOGEN, NITRITE, LEUKOCYTESUR in the last 72 hours.  Invalid input(s): APPERANCEUR    Imaging: No results found.   Assessment & Plan: Katie Gilbert is a 34 y.o.  female with past medical conditions including diabetes, hypertension, diabetic neuropathy, gastroparesis, anemia, and nephropathy, who was admitted to [facility] on 01/14/2025 for Leg swelling [M79.89] AKI (acute kidney injury) [N17.9] Left leg swelling [M79.89] Right leg swelling [M79.89] Nausea and vomiting, unspecified vomiting type [R11.2]  Acute kidney injury with proteinuria on chronic kidney disease stage IIIb.  Baseline creatinine appears to be 1.62 with GFR 43 on 07/22/2024.  Creatinine 2.78 on ED arrival, 3.3 today.  Patient has received IV diuresis  during this admission.  Will order protein creatinine ratio to evaluate nephrotic syndrome.  Continue IV furosemide  drip with albumin  3 times daily.  No acute indication for dialysis.  Will monitor for now.  2.  Acute metabolic acidosis, serum bicarb 17 on ED arrival.  Has corrected to 19.  Will order sodium bicarbonate  supplementation.  3. Anemia of chronic kidney disease.  Lab Results  Component Value Date   HGB 9.7 (L) 01/15/2025    Hemoglobin acceptable for this patient.  4.  Hyperkalemia likely secondary to kidney injury and diet.  Educated patient on low potassium options.  Will defer diet change for now.   LOS: 6 Reva Pinkley 2/4/20264:30 PM     [1]  Allergies Allergen Reactions   Haloperidol Other (See Comments)    Dystonic reaction  Neck spasms   "

## 2025-01-21 NOTE — Progress Notes (Signed)
 " PROGRESS NOTE    Katie Gilbert  FMW:[id number] DOB: [birth year] DOA: 01/14/2025 PCP: Orlean Alan HERO, FNP  153A/[id number]-AA  LOS: 6 days   Brief hospital course:   Assessment & Plan: Katie Gilbert is a 34 y.o. 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  She presented with a several day history of nausea and vomiting, poor oral intake associated with generalized weakness. At the present time she denies abdominal pain, fever or change in bowel habits or dysuria.  She does endorse lower extremity edema L>R that is chronic.  Denies shortness of breath. Of note, she has had prior hospitalizations for intractable symptoms, most recently November 2022.  EGD at that time showed a large volume of food residue.She is followed by GI at [facility], last seen September 2025 with plans for nuclear gastric emptying studies, not yet done.    N/V likely 2/2 Gastroparesis --improved.  D/c'ed IV reglan  --cont low-fat diet  Diarrhea, improved Acute gastroenteritis --started after presentation.  C diff and GI path neg.  Maybe due to IV Reglan . 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 Creatinine 2.78 up from baseline of 1.13 with bicarb 17 and normal anion gap Urinalysis with proteinuria --US  renal remarkable --nephro consulted --workup for nephrotic syndrome  Anemia of chronic kidney failure, stage 2 (mild) Hemoglobin stable around 9.1  Bilateral lower extremity edema Acute non cardiogenic pulmonary edema  Appears chronic-no shortness of breath BNP elevated but this could be related to abnormal renal function Lower extremity venous ultrasound negative for DVT --Echo with DD --cont IV lasix  gtt@6  with IV albumin  support  Generalized  anxiety disorder --cont Buspar   HTN --cont Nifedipine   --hold Lisinopril   Obesity, Class III, BMI 40-49.9 (morbid obesity) (HCC) Increased risk of poor health outcomes  Hyperkalemia --resolved with IV lasix    DVT prophylaxis: Lovenox  SQ Code Status: Full code  Family Communication:  Level of care: Med-Surg Dispo:   The patient is from: home Anticipated d/c is to: home Anticipated d/c date is: 2-3 days   Subjective and Interval History:  Katie Gilbert said she thought she had more urine output now.   Objective: Vitals:   01/20/25 1932 01/21/25 0559 01/21/25 0807 01/21/25 1618  BP: (!) 147/87 123/75 117/73 (!) 147/97  Pulse: 91 90 87 85  Resp: 18 18 18 18   Temp: 97.6 F (36.4 C) 97.6 F (36.4 C) 97.7 F (36.5 C) 98.1 F (36.7 C)  TempSrc:    Oral  SpO2: 95% 94% 95% 99%  Weight:      Height:        Intake/Output Summary (Last 24 hours) at 01/21/2025 1901 Last data filed at 01/21/2025 1409 Gross per 24 hour  Intake 611.61 ml  Output 1600 ml  Net -988.39 ml   Filed Weights   01/14/25 1617 01/14/25 2151  Weight: 127 kg (!) 149 kg    Examination:   Constitutional: NAD, AAOx3 HEENT: conjunctivae and lids normal, EOMI CV: No cyanosis.   RESP: normal respiratory effort, on RA Extremities: tense edema in BLE SKIN: warm, dry Neuro: II - XII grossly intact.   Psych: Normal mood and affect.  Appropriate judgement and reason   Data Reviewed: I have personally reviewed labs and imaging studies  Time spent: 35 minutes  Ellouise Haber, MD [facility]  If 7PM-7AM, please contact night-coverage 01/21/2025, 7:01 PM   "

## 2025-01-21 NOTE — Plan of Care (Signed)

## 2025-01-21 NOTE — Progress Notes (Signed)
 Heart Failure Navigator Progress Note  Assessed for Heart & Vascular TOC clinic readiness.  Per Hospitalist note on 01/14/25-patient with acute non cardiogenic pulmonary edema and abnormal renal function.  Navigator available for reassessment of patient but will sign off at this time.  Charmaine Pines, RN, BSN [facility] Heart Failure Navigator Secure Chat Only

## 2025-01-22 DIAGNOSIS — K3184 Gastroparesis: Secondary | ICD-10-CM | POA: Diagnosis not present

## 2025-01-22 DIAGNOSIS — E1143 Type 2 diabetes mellitus with diabetic autonomic (poly)neuropathy: Secondary | ICD-10-CM | POA: Diagnosis not present

## 2025-01-22 LAB — CBC
HCT: 26 % — ABNORMAL LOW (ref 36.0–46.0)
Hemoglobin: 7.7 g/dL — ABNORMAL LOW (ref 12.0–15.0)
MCH: 26.6 pg (ref 26.0–34.0)
MCHC: 29.6 g/dL — ABNORMAL LOW (ref 30.0–36.0)
MCV: 89.7 fL (ref 80.0–100.0)
Platelets: 251 10*3/uL (ref 150–400)
RBC: 2.9 MIL/uL — ABNORMAL LOW (ref 3.87–5.11)
RDW: 14.6 % (ref 11.5–15.5)
WBC: 5.8 10*3/uL (ref 4.0–10.5)
nRBC: 0 % (ref 0.0–0.2)

## 2025-01-22 LAB — GLOMERULAR BASEMENT MEMBRANE ANTIBODIES: GBM Ab: <0.2 U (ref 0.0–0.9)

## 2025-01-22 LAB — C4 COMPLEMENT: Complement C4, Body Fluid: 32 mg/dL (ref 12–38)

## 2025-01-22 LAB — BASIC METABOLIC PANEL WITH GFR
Anion gap: 10 (ref 5–15)
BUN: 43 mg/dL — ABNORMAL HIGH (ref 6–20)
CO2: 18 mmol/L — ABNORMAL LOW (ref 22–32)
Calcium: 8.2 mg/dL — ABNORMAL LOW (ref 8.9–10.3)
Chloride: 111 mmol/L (ref 98–111)
Creatinine, Ser: 3.23 mg/dL — ABNORMAL HIGH (ref 0.44–1.00)
GFR, Estimated: 19 mL/min — ABNORMAL LOW
Glucose, Bld: 168 mg/dL — ABNORMAL HIGH (ref 70–99)
Potassium: 5.5 mmol/L — ABNORMAL HIGH (ref 3.5–5.1)
Sodium: 139 mmol/L (ref 135–145)

## 2025-01-22 LAB — ANA W/REFLEX IF POSITIVE: Anti Nuclear Antibody (ANA): NEGATIVE

## 2025-01-22 LAB — ANCA PROFILE
Anti-MPO Antibodies: <0.2 U (ref 0.0–0.9)
Anti-PR3 Antibodies: <0.2 U (ref 0.0–0.9)
Atypical P-ANCA titer: <1:20 {titer}
C-ANCA: <1:20 {titer}
P-ANCA: <1:20 {titer}

## 2025-01-22 LAB — C3 COMPLEMENT: C3 Complement: 128 mg/dL (ref 82–167)

## 2025-01-22 MED ORDER — PATIROMER SORBITEX CALCIUM 8.4 G PO PACK
16.8000 g | PACK | Freq: Every day | ORAL | Status: DC
  Administered 2025-01-22: 16.8 g via ORAL
  Filled 2025-01-22 (×2): qty 2

## 2025-01-22 NOTE — Plan of Care (Signed)

## 2025-01-22 NOTE — Progress Notes (Signed)
 " [facility]  ROUNDING NOTE   Subjective:   Patient seen resting in bed Feels little progress has been made with edema Appetite appropriate Lower extremity edema   Objective:  Vital signs in last 24 hours:  Temp:  [97.5 F (36.4 C)-98.2 F (36.8 C)] 97.5 F (36.4 C) (02/05 0839) Pulse Rate:  [79-85] 83 (02/05 0839) Resp:  [17-20] 17 (02/05 0839) BP: (118-147)/(71-97) 118/71 (02/05 0839) SpO2:  [99 %-100 %] 99 % (02/05 0839)  Weight change:  Filed Weights   01/14/25 1617 01/14/25 2151  Weight: 127 kg (!) 149 kg    Intake/Output: I/O last 3 completed shifts: In: 851.6 [P.O.:720; I.V.:31.6; IV Piggyback:100] Out: 2850 [Urine:2850]   Intake/Output this shift:  Total I/O In: 330.1 [P.O.:240; I.V.:90.1] Out: -   Physical Exam: General: NAD  Head: Normocephalic, atraumatic. Moist oral mucosal membranes  Eyes: Anicteric  Lungs:  Clear to auscultation, room air  Heart: Regular rate and rhythm  Abdomen:  Soft, nontender  Extremities:  Non pitting peripheral edema.up to abd  Neurologic: Awake, alert, conversant  Skin: Warm,dry, no rash  Access: None    Basic Metabolic Panel: Recent Labs  Lab 01/16/25 0711 01/17/25 0820 01/18/25 0738 01/19/25 0450 01/20/25 0508 01/21/25 0553 01/22/25 0548  NA 136   < > 138 138 139 139 139  K 4.9   < > 5.2* 5.0 4.9 5.7* 5.5*  CL 113*   < > 113* 112* 109 111 111  CO2 18*   < > 18* 18* 19* 19* 18*  GLUCOSE 121*   < > 107* 136* 118* 111* 168*  BUN 31*   < > 34* 35* 39* 40* 43*  CREATININE 2.92*   < > 3.14* 3.10* 3.26* 3.30* 3.23*  CALCIUM  7.6*   < > 7.6* 7.7* 8.2* 8.1* 8.2*  MG 1.9  --  2.0 2.1  --  2.1  --    < > = values in this interval not displayed.    Liver Function Tests: No results for input(s): AST, ALT, ALKPHOS, BILITOT, PROT, ALBUMIN  in the last 168 hours. No results for input(s): LIPASE, AMYLASE in the last 168 hours. No results for input(s): AMMONIA in the last 168  hours.  CBC: Recent Labs  Lab 01/22/25 0548  WBC 5.8  HGB 7.7*  HCT 26.0*  MCV 89.7  PLT 251    Cardiac Enzymes: No results for input(s): CKTOTAL, CKMB, CKMBINDEX, TROPONINI in the last 168 hours.  BNP: Invalid input(s): POCBNP  CBG: Recent Labs  Lab 01/15/25 2148  GLUCAP 198*    Microbiology: Results for orders placed or performed during the hospital encounter of 01/14/25  Gastrointestinal Panel by PCR , Stool     Status: None   Collection Time: 01/15/25  1:39 AM   Specimen: Stool  Result Value Ref Range Status   Campylobacter species NOT DETECTED NOT DETECTED Final   Plesimonas shigelloides NOT DETECTED NOT DETECTED Final   Salmonella species NOT DETECTED NOT DETECTED Final   Yersinia enterocolitica NOT DETECTED NOT DETECTED Final   Vibrio species NOT DETECTED NOT DETECTED Final   Vibrio cholerae NOT DETECTED NOT DETECTED Final   Enteroaggregative E coli (EAEC) NOT DETECTED NOT DETECTED Final   Enteropathogenic E coli (EPEC) NOT DETECTED NOT DETECTED Final   Enterotoxigenic E coli (ETEC) NOT DETECTED NOT DETECTED Final   Shiga like toxin producing E coli (STEC) NOT DETECTED NOT DETECTED Final   Shigella/Enteroinvasive E coli (EIEC) NOT DETECTED NOT DETECTED Final   Cryptosporidium  NOT DETECTED NOT DETECTED Final   Cyclospora cayetanensis NOT DETECTED NOT DETECTED Final   Entamoeba histolytica NOT DETECTED NOT DETECTED Final   Giardia lamblia NOT DETECTED NOT DETECTED Final   Adenovirus F40/41 NOT DETECTED NOT DETECTED Final   Astrovirus NOT DETECTED NOT DETECTED Final   Norovirus GI/GII NOT DETECTED NOT DETECTED Final   Rotavirus A NOT DETECTED NOT DETECTED Final   Sapovirus (I, II, IV, and V) NOT DETECTED NOT DETECTED Final    Comment: Performed at [facility], [address] [place], [place] [address]  C Difficile Quick Screen w PCR reflex     Status: None   Collection Time: 01/15/25  1:39 AM   Specimen: STOOL  Result Value Ref Range  Status   C Diff antigen NEGATIVE NEGATIVE Final   C Diff toxin NEGATIVE NEGATIVE Final   C Diff interpretation No C. difficile detected.  Final    Comment: Performed at [facility], [address] [place], [place] [address]    Coagulation Studies: No results for input(s): LABPROT, INR in the last 72 hours.  Urinalysis: No results for input(s): COLORURINE, LABSPEC, PHURINE, GLUCOSEU, HGBUR, BILIRUBINUR, KETONESUR, PROTEINUR, UROBILINOGEN, NITRITE, LEUKOCYTESUR in the last 72 hours.  Invalid input(s): APPERANCEUR    Imaging: No results found.   Medications:    albumin  human 25 g (01/22/25 1315)   furosemide  (LASIX ) 200 mg in dextrose  5 % 100 mL (2 mg/mL) infusion 6 mg/hr (01/22/25 1333)   promethazine  (PHENERGAN ) injection (IM or IVPB)      atorvastatin   40 mg Oral Daily   busPIRone   5 mg Oral BID   enoxaparin  (LOVENOX ) injection  0.5 mg/kg Subcutaneous Q24H   NIFEdipine   60 mg Oral Daily   pantoprazole   40 mg Oral QHS   acetaminophen  **OR** acetaminophen , hydrALAZINE , HYDROcodone -acetaminophen , hydrOXYzine , ondansetron  **OR** ondansetron  (ZOFRAN ) IV, promethazine  (PHENERGAN ) injection (IM or IVPB)  Assessment/ Plan:  [name] is a 34 y.o.  female  with past medical conditions including diabetes, hypertension, diabetic neuropathy, gastroparesis, anemia, and nephropathy, who was admitted to [facility] on 01/14/2025 for Leg swelling [M79.89] AKI (acute kidney injury) [N17.9] Left leg swelling [M79.89] Right leg swelling [M79.89] Nausea and vomiting, unspecified vomiting type [R11.2]   Acute kidney injury with proteinuria on chronic kidney disease stage IIIb. Baseline creatinine appears to be 1.62 with GFR 43 on 07/22/2024. Creatinine 2.78 on ED arrival Creatinine remains elevated, stable. Protein creatinine ratio resulted in nephrotic range, 8.1. Continue current treatments, IV furosemide  drip with albumin .  Lab Results  Component  Value Date   CREATININE 3.23 (H) 01/22/2025   CREATININE 3.30 (H) 01/21/2025   CREATININE 3.26 (H) 01/20/2025    Intake/Output Summary (Last 24 hours) at 01/22/2025 1341 Last data filed at 01/22/2025 1333 Gross per 24 hour  Intake 810.11 ml  Output 1250 ml  Net -439.89 ml   2.  Acute metabolic acidosis, serum bicarb 17 on ED arrival. Will continue to monitor for now.   3. Anemia of chronic kidney disease  Lab Results  Component Value Date   HGB 7.7 (L) 01/22/2025    Hgb 7.7. Will continue to monitor.   4.  Hyperkalemia likely secondary to kidney injury and diet.  Will order daily Veltassa  16.8g.    LOS: 7 Deroy Noah 2/5/20261:41 PM   "

## 2025-01-22 NOTE — Progress Notes (Signed)
 " PROGRESS NOTE    Katie Gilbert  FMW:[id number] DOB: December 25, 1990 DOA: 01/14/2025 PCP: Orlean Alan HERO, FNP  153A/153A-AA  LOS: 7 days   Brief hospital course:   Assessment & Plan: Katie Gilbert is a 34 y.o. 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  She presented with a several day history of nausea and vomiting, poor oral intake associated with generalized weakness. At the present time she denies abdominal pain, fever or change in bowel habits or dysuria.  She does endorse lower extremity edema L>R that is chronic.  Denies shortness of breath. Of note, she has had prior hospitalizations for intractable symptoms, most recently November 2022.  EGD at that time showed a large volume of food residue.She is followed by GI at [facility], last seen September 2025 with plans for nuclear gastric emptying studies, not yet done.    AKI (acute kidney injury) Acute metabolic acidosis, none anion gap CKD ll - llla / diabetic nephropathy Creatinine 2.78 up from baseline of 1.13 with bicarb 17 and normal anion gap Urinalysis with proteinuria --US  renal remarkable --nephro consulted --workup per nephro  Anemia of chronic kidney failure, stage 2 (mild) --monitor Hgb  Bilateral lower extremity edema Acute non cardiogenic pulmonary edema  Appears chronic-no shortness of breath BNP elevated but this could be related to abnormal renal function Lower extremity venous ultrasound negative for DVT --Echo with DD --cont IV lasix  gtt@6  with IV albumin  support --Strict I/O  Generalized anxiety disorder --cont Buspar   HTN --cont Nifedipine   --hold Lisinopril   Obesity, Class III, BMI 40-49.9 (morbid obesity) (HCC) Increased risk of poor health outcomes  Hyperkalemia --start Veltassa  16.8 g daily  N/V likely  2/2 Gastroparesis --improved.  D/c'ed IV reglan  --cont low-fat diet  Diarrhea, improved Acute gastroenteritis --started after presentation.  C diff and GI path neg.  Maybe due to IV Reglan . --avoid Imodium    DVT prophylaxis: Lovenox  SQ Code Status: Full code  Family Communication:  Level of care: Med-Surg Dispo:   The patient is from: home Anticipated d/c is to: home Anticipated d/c date is: 2-3 days   Subjective and Interval History:  Pt could not tell whether urine output had increased.     Objective: Vitals:   01/21/25 1945 01/22/25 0503 01/22/25 0839 01/22/25 1937  BP: 125/73 131/78 118/71 131/80  Pulse: 79 81 83 88  Resp: 20 20 17 18   Temp: 98.2 F (36.8 C)  (!) 97.5 F (36.4 C) (!) 97.5 F (36.4 C)  TempSrc:      SpO2: 100% 100% 99% 100%  Weight:      Height:        Intake/Output Summary (Last 24 hours) at 01/22/2025 1948 Last data filed at 01/22/2025 1900 Gross per 24 hour  Intake 690.11 ml  Output 1250 ml  Net -559.89 ml   Filed Weights   01/14/25 1617 01/14/25 2151  Weight: 127 kg (!) 149 kg    Examination:   Constitutional: NAD, AAOx3 HEENT: conjunctivae and lids normal, EOMI CV: No cyanosis.   RESP: normal respiratory effort, on RA Neuro: II - XII grossly intact.   Psych: Normal mood and affect.  Appropriate judgement and reason   Data Reviewed: I have personally reviewed labs and imaging studies  Time spent: 35 minutes  Ellouise Haber, MD [facility] If 7PM-7AM, please contact night-coverage 01/22/2025, 7:48 PM   "

## 2025-01-23 LAB — BASIC METABOLIC PANEL WITH GFR
Anion gap: 8 (ref 5–15)
BUN: 50 mg/dL — ABNORMAL HIGH (ref 6–20)
CO2: 20 mmol/L — ABNORMAL LOW (ref 22–32)
Calcium: 8.1 mg/dL — ABNORMAL LOW (ref 8.9–10.3)
Chloride: 113 mmol/L — ABNORMAL HIGH (ref 98–111)
Creatinine, Ser: 3.42 mg/dL — ABNORMAL HIGH (ref 0.44–1.00)
GFR, Estimated: 17 mL/min — ABNORMAL LOW
Glucose, Bld: 147 mg/dL — ABNORMAL HIGH (ref 70–99)
Potassium: 6.1 mmol/L — ABNORMAL HIGH (ref 3.5–5.1)
Sodium: 141 mmol/L (ref 135–145)

## 2025-01-23 MED ORDER — PATIROMER SORBITEX CALCIUM 8.4 G PO PACK
25.2000 g | PACK | Freq: Every day | ORAL | Status: AC
  Administered 2025-01-23: 25.2 g via ORAL
  Filled 2025-01-23: qty 3

## 2025-01-23 MED ORDER — SODIUM ZIRCONIUM CYCLOSILICATE 10 G PO PACK
10.0000 g | PACK | Freq: Two times a day (BID) | ORAL | Status: AC
  Administered 2025-01-23 (×2): 10 g via ORAL
  Filled 2025-01-23 (×2): qty 1

## 2025-01-23 NOTE — Progress Notes (Signed)
 " [facility]  ROUNDING NOTE   Subjective:   Patient seen laying in bed Alert Complains of left flank pain  Objective:  Vital signs in last 24 hours:  Temp:  [97.5 F (36.4 C)-98.3 F (36.8 C)] 98.2 F (36.8 C) (02/06 0756) Pulse Rate:  [86-91] 91 (02/06 0756) Resp:  [17-18] 17 (02/06 0756) BP: (107-133)/(62-80) 133/79 (02/06 0756) SpO2:  [91 %-100 %] 99 % (02/06 0756) Weight:  [154.2 kg] 154.2 kg (02/06 1002)  Weight change:  Filed Weights   01/14/25 1617 01/14/25 2151 01/23/25 1002  Weight: 127 kg (!) 149 kg (!) 154.2 kg    Intake/Output: I/O last 3 completed shifts: In: 690.1 [P.O.:600; I.V.:90.1] Out: 2250 [Urine:2250]   Intake/Output this shift:  Total I/O In: 240 [P.O.:240] Out: -   Physical Exam: General: NAD  Head: Normocephalic, atraumatic. Moist oral mucosal membranes  Eyes: Anicteric  Lungs:  Clear to auscultation, room air  Heart: Regular rate and rhythm  Abdomen:  Soft, nontender  Extremities:  Non pitting peripheral edema.up to abd  Neurologic: Awake, alert, conversant  Skin: Warm,dry, no rash  Access: None    Basic Metabolic Panel: Recent Labs  Lab 01/18/25 0738 01/19/25 0450 01/20/25 0508 01/21/25 0553 01/22/25 0548 01/23/25 0436  NA 138 138 139 139 139 141  K 5.2* 5.0 4.9 5.7* 5.5* 6.1*  CL 113* 112* 109 111 111 113*  CO2 18* 18* 19* 19* 18* 20*  GLUCOSE 107* 136* 118* 111* 168* 147*  BUN 34* 35* 39* 40* 43* 50*  CREATININE 3.14* 3.10* 3.26* 3.30* 3.23* 3.42*  CALCIUM  7.6* 7.7* 8.2* 8.1* 8.2* 8.1*  MG 2.0 2.1  --  2.1  --   --     Liver Function Tests: No results for input(s): AST, ALT, ALKPHOS, BILITOT, PROT, ALBUMIN  in the last 168 hours. No results for input(s): LIPASE, AMYLASE in the last 168 hours. No results for input(s): AMMONIA in the last 168 hours.  CBC: Recent Labs  Lab 01/22/25 0548  WBC 5.8  HGB 7.7*  HCT 26.0*  MCV 89.7  PLT 251    Cardiac Enzymes: No results for  input(s): CKTOTAL, CKMB, CKMBINDEX, TROPONINI in the last 168 hours.  BNP: Invalid input(s): POCBNP  CBG: No results for input(s): GLUCAP in the last 168 hours.   Microbiology: Results for orders placed or performed during the hospital encounter of 01/14/25  Gastrointestinal Panel by PCR , Stool     Status: None   Collection Time: 01/15/25  1:39 AM   Specimen: Stool  Result Value Ref Range Status   Campylobacter species NOT DETECTED NOT DETECTED Final   Plesimonas shigelloides NOT DETECTED NOT DETECTED Final   Salmonella species NOT DETECTED NOT DETECTED Final   Yersinia enterocolitica NOT DETECTED NOT DETECTED Final   Vibrio species NOT DETECTED NOT DETECTED Final   Vibrio cholerae NOT DETECTED NOT DETECTED Final   Enteroaggregative E coli (EAEC) NOT DETECTED NOT DETECTED Final   Enteropathogenic E coli (EPEC) NOT DETECTED NOT DETECTED Final   Enterotoxigenic E coli (ETEC) NOT DETECTED NOT DETECTED Final   Shiga like toxin producing E coli (STEC) NOT DETECTED NOT DETECTED Final   Shigella/Enteroinvasive E coli (EIEC) NOT DETECTED NOT DETECTED Final   Cryptosporidium NOT DETECTED NOT DETECTED Final   Cyclospora cayetanensis NOT DETECTED NOT DETECTED Final   Entamoeba histolytica NOT DETECTED NOT DETECTED Final   Giardia lamblia NOT DETECTED NOT DETECTED Final   Adenovirus F40/41 NOT DETECTED NOT DETECTED Final   Astrovirus  NOT DETECTED NOT DETECTED Final   Norovirus GI/GII NOT DETECTED NOT DETECTED Final   Rotavirus A NOT DETECTED NOT DETECTED Final   Sapovirus (I, II, IV, and V) NOT DETECTED NOT DETECTED Final    Comment: Performed at [facility], [address] [place], [place] [address]  C Difficile Quick Screen w PCR reflex     Status: None   Collection Time: 01/15/25  1:39 AM   Specimen: STOOL  Result Value Ref Range Status   C Diff antigen NEGATIVE NEGATIVE Final   C Diff toxin NEGATIVE NEGATIVE Final   C Diff interpretation No C. difficile  detected.  Final    Comment: Performed at [facility], [address] [place], [place] [address]    Coagulation Studies: No results for input(s): LABPROT, INR in the last 72 hours.  Urinalysis: No results for input(s): COLORURINE, LABSPEC, PHURINE, GLUCOSEU, HGBUR, BILIRUBINUR, KETONESUR, PROTEINUR, UROBILINOGEN, NITRITE, LEUKOCYTESUR in the last 72 hours.  Invalid input(s): APPERANCEUR    Imaging: No results found.   Medications:    albumin  human 25 g (01/23/25 1331)   furosemide  (LASIX ) 200 mg in dextrose  5 % 100 mL (2 mg/mL) infusion 8 mg/hr (01/23/25 1330)   promethazine  (PHENERGAN ) injection (IM or IVPB)      atorvastatin   40 mg Oral Daily   busPIRone   5 mg Oral BID   enoxaparin  (LOVENOX ) injection  0.5 mg/kg Subcutaneous Q24H   NIFEdipine   60 mg Oral Daily   pantoprazole   40 mg Oral QHS   patiromer   25.2 g Oral Daily   sodium zirconium cyclosilicate   10 g Oral BID   acetaminophen  **OR** acetaminophen , hydrALAZINE , HYDROcodone -acetaminophen , hydrOXYzine , ondansetron  **OR** ondansetron  (ZOFRAN ) IV, promethazine  (PHENERGAN ) injection (IM or IVPB)  Assessment/ Plan:  [name] is a 34 y.o.  female  with past medical conditions including diabetes, hypertension, diabetic neuropathy, gastroparesis, anemia, and nephropathy, who was admitted to [facility] on 01/14/2025 for Leg swelling [M79.89] AKI (acute kidney injury) [N17.9] Left leg swelling [M79.89] Right leg swelling [M79.89] Nausea and vomiting, unspecified vomiting type [R11.2]   Acute kidney injury with proteinuria on chronic kidney disease stage IIIb. Baseline creatinine appears to be 1.62 with GFR 43 on 07/22/2024. Creatinine 2.78 on ED arrival. Nephrotic range proteinuria, 8.1.  Renal function continues to worsen, expected with furosemide  drip. Ordered standing weights daily. Will increase furosemide  drip to 8mg /hr and continue albumin . No acute indication for dialysis but  if renal function does not improve, may have to consider.  Lab Results  Component Value Date   CREATININE 3.42 (H) 01/23/2025   CREATININE 3.23 (H) 01/22/2025   CREATININE 3.30 (H) 01/21/2025    Intake/Output Summary (Last 24 hours) at 01/23/2025 1332 Last data filed at 01/23/2025 1034 Gross per 24 hour  Intake 450.11 ml  Output 1000 ml  Net -549.89 ml   2.  Acute metabolic acidosis, serum bicarb 17 on ED arrival. S bicarb has improved to 20  3. Anemia of chronic kidney disease  Lab Results  Component Value Date   HGB 7.7 (L) 01/22/2025    Hgb 7.7, will consider weekly ESA  4.  Hyperkalemia likely secondary to kidney injury and diet.  Potassium 6.1 today, despite veltassa . Will increase to veltassa  25.2g and add Lokelma  10g x 2 today.    LOS: 8 Garvis Downum 2/6/20261:32 PM   "

## 2025-01-23 NOTE — Plan of Care (Signed)

## 2025-01-23 NOTE — Progress Notes (Signed)
 " PROGRESS NOTE    Katie Gilbert  FMW:[id number] DOB: 04/05/1991 DOA: 01/14/2025 PCP: Orlean Alan HERO, FNP  153A/153A-AA  LOS: 8 days   Brief hospital course:   Assessment & Plan: Katie Gilbert is a 34 y.o. 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  She presented with a several day history of nausea and vomiting, poor oral intake associated with generalized weakness. At the present time she denies abdominal pain, fever or change in bowel habits or dysuria.  She does endorse lower extremity edema L>R that is chronic.  Denies shortness of breath. Of note, she has had prior hospitalizations for intractable symptoms, most recently November 2022.  EGD at that time showed a large volume of food residue.She is followed by GI at [facility], last seen September 2025 with plans for nuclear gastric emptying studies, not yet done.    AKI (acute kidney injury) Acute metabolic acidosis, none anion gap CKD ll - llla / diabetic nephropathy Creatinine 2.78 up from baseline of 1.13 with bicarb 17 and normal anion gap Urinalysis with proteinuria --US  renal remarkable --nephro consulted --Cr continues to trend up --workup per nephro  Anemia of chronic kidney failure, stage 2 (mild) --monitor Hgb  Bilateral lower extremity edema Acute non cardiogenic pulmonary edema  -no shortness of breath.  BNP elevated. Lower extremity venous ultrasound negative for DVT --Echo with DD --started on IV lasix  gtt with IV albumin  support by nephro --increase lasix  gtt to 8 mg/hr, per nephro --strict I/O  Generalized anxiety disorder --cont Buspar   HTN --cont Nifedipine   --hold Lisinopril   Obesity, Class III, BMI 40-49.9 (morbid obesity) (HCC) Increased risk of poor health outcomes  Hyperkalemia --cont Veltassa  and  Lokelma   N/V likely 2/2 Gastroparesis --improved.  D/c'ed IV reglan   Diarrhea, improved Acute gastroenteritis --started after presentation.  C diff and GI path neg.  Maybe due to IV Reglan . --avoid Imodium    DVT prophylaxis: Lovenox  SQ Code Status: Full code  Family Communication:  Level of care: Med-Surg Dispo:   The patient is from: home Anticipated d/c is to: home Anticipated d/c date is: 2-3 days   Subjective and Interval History:  Pt didn't feel there was a significant increased urine output.     Objective: Vitals:   01/23/25 0756 01/23/25 1002 01/23/25 1559 01/23/25 1925  BP: 133/79  135/80 101/68  Pulse: 91  88 85  Resp: 17  17 20   Temp: 98.2 F (36.8 C)  97.8 F (36.6 C) 98.1 F (36.7 C)  TempSrc: Oral   Oral  SpO2: 99%  95% 91%  Weight:  (!) 154.2 kg    Height:        Intake/Output Summary (Last 24 hours) at 01/23/2025 2153 Last data filed at 01/23/2025 1700 Gross per 24 hour  Intake 600 ml  Output 350 ml  Net 250 ml   Filed Weights   01/14/25 1617 01/14/25 2151 01/23/25 1002  Weight: 127 kg (!) 149 kg (!) 154.2 kg    Examination:   Constitutional: NAD, AAOx3 HEENT: conjunctivae and lids normal, EOMI CV: No cyanosis.   RESP: normal respiratory effort, on RA Extremities: tense edema in BLE SKIN: warm, dry Neuro: II - XII grossly intact.   Psych: Normal mood and affect.  Appropriate judgement and reason   Data Reviewed: I have personally reviewed labs and imaging studies  Time spent: 91  minutes  Ellouise Haber, MD [facility] If 7PM-7AM, please contact night-coverage 01/23/2025, 9:53 PM   "
